# Patient Record
Sex: Female | Born: 1985 | Hispanic: Yes | Marital: Single | Smoking: Former smoker
Health system: Southern US, Community
[De-identification: ages and names within clinical notes are randomized; demographics above are authoritative.]

## PROBLEM LIST (undated history)

## (undated) DIAGNOSIS — O24419 Gestational diabetes mellitus in pregnancy, unspecified control: Secondary | ICD-10-CM

## (undated) DIAGNOSIS — F329 Major depressive disorder, single episode, unspecified: Secondary | ICD-10-CM

## (undated) DIAGNOSIS — F32A Depression, unspecified: Secondary | ICD-10-CM

## (undated) DIAGNOSIS — R51 Headache: Secondary | ICD-10-CM

## (undated) DIAGNOSIS — K219 Gastro-esophageal reflux disease without esophagitis: Secondary | ICD-10-CM

## (undated) DIAGNOSIS — E282 Polycystic ovarian syndrome: Secondary | ICD-10-CM

## (undated) DIAGNOSIS — D649 Anemia, unspecified: Secondary | ICD-10-CM

## (undated) HISTORY — PX: NO PAST SURGERIES: SHX2092

---

## 2011-08-24 ENCOUNTER — Emergency Department (HOSPITAL_COMMUNITY)
Admission: EM | Admit: 2011-08-24 | Discharge: 2011-08-24 | Disposition: A | Discharge status: Home / Self Care | Payer: Self-pay | Attending: Emergency Medicine | Admitting: Emergency Medicine

## 2011-08-24 ENCOUNTER — Encounter: Payer: Self-pay | Admitting: Emergency Medicine

## 2011-08-24 DIAGNOSIS — H5710 Ocular pain, unspecified eye: Secondary | ICD-10-CM | POA: Insufficient documentation

## 2011-08-24 DIAGNOSIS — F17200 Nicotine dependence, unspecified, uncomplicated: Secondary | ICD-10-CM | POA: Insufficient documentation

## 2011-08-24 DIAGNOSIS — H53149 Visual discomfort, unspecified: Secondary | ICD-10-CM | POA: Insufficient documentation

## 2011-08-24 DIAGNOSIS — H539 Unspecified visual disturbance: Secondary | ICD-10-CM | POA: Insufficient documentation

## 2011-08-24 DIAGNOSIS — S0510XA Contusion of eyeball and orbital tissues, unspecified eye, initial encounter: Secondary | ICD-10-CM | POA: Insufficient documentation

## 2011-08-24 MED ORDER — ERYTHROMYCIN 5 MG/GM OP OINT
TOPICAL_OINTMENT | Freq: Once | OPHTHALMIC | Status: AC
  Administered 2011-08-24: 20:00:00 via OPHTHALMIC
  Filled 2011-08-24: qty 1

## 2011-08-24 MED ORDER — TETRACAINE HCL 0.5 % OP SOLN
1.0000 [drp] | Freq: Once | OPHTHALMIC | Status: DC
  Filled 2011-08-24: qty 2

## 2011-08-24 MED ORDER — ERYTHROMYCIN 5 MG/GM OP OINT: TOPICAL_OINTMENT | Freq: Four times a day (QID) | OPHTHALMIC | Status: AC

## 2011-08-24 MED ORDER — FLUORESCEIN SODIUM 1 MG OP STRP
ORAL_STRIP | OPHTHALMIC | Status: AC
  Filled 2011-08-24: qty 1

## 2011-08-24 NOTE — ED Notes (Signed)
Pt visual acuity:she stated that she wears corrective wear but doesn't have it with her. Uncorrected, Both: 20/20, Right: 20/25, Left 20/25 blurry.
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
## 2011-08-24 NOTE — ED Notes (Signed)
Left Eye dressing placed to left eye with sterile 2x2's, sterile eye pad, and 1 inch tape. Pt. Tolerated well.

## 2011-08-24 NOTE — ED Notes (Signed)
Ointment placed in eye and on lacs. Dressing placed. Pt tolerated well. No distress noted. Dressing dry and intact.
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
## 2011-08-25 NOTE — ED Provider Notes (Signed)
I saw and evaluated the patient, reviewed the resident's note and I agree with the findings and plan.  There is a small laceration adjacent to the medial canthus of the left eye.  When closing her eye, the lid does not completely close.  We spoke with opthalmology who recommends a dressing and follow up tomorrow.    Geoffery Lyons, MD 08/25/11 [phone]

## 2012-04-23 ENCOUNTER — Emergency Department (HOSPITAL_COMMUNITY)
Admission: EM | Admit: 2012-04-23 | Discharge: 2012-04-23 | Disposition: A | Discharge status: Home / Self Care | Payer: BC Managed Care – PPO | Attending: Emergency Medicine | Admitting: Emergency Medicine

## 2012-04-23 ENCOUNTER — Encounter (HOSPITAL_COMMUNITY): Payer: Self-pay | Admitting: Emergency Medicine

## 2012-04-23 DIAGNOSIS — Z87891 Personal history of nicotine dependence: Secondary | ICD-10-CM | POA: Insufficient documentation

## 2012-04-23 DIAGNOSIS — O200 Threatened abortion: Secondary | ICD-10-CM | POA: Insufficient documentation

## 2012-04-23 LAB — OB RESULTS CONSOLE RUBELLA ANTIBODY, IGM: Rubella: IMMUNE

## 2012-04-23 LAB — OB RESULTS CONSOLE HEPATITIS B SURFACE ANTIGEN: Hepatitis B Surface Ag: NEGATIVE

## 2012-04-23 LAB — OB RESULTS CONSOLE ABO/RH: RH Type: POSITIVE

## 2012-04-23 LAB — OB RESULTS CONSOLE ANTIBODY SCREEN: Antibody Screen: NEGATIVE

## 2012-04-23 LAB — ABO/RH

## 2012-04-23 NOTE — ED Provider Notes (Addendum)
History     CSN: [id number]  Arrival date & time 04/23/12  0305   First MD Initiated Contact with Patient 04/23/12 0324      Chief Complaint  Patient presents with  . Vaginal Bleeding    (Consider location/radiation/quality/duration/timing/severity/associated sxs/prior treatment) Patient is a 26 y.o. female presenting with vaginal bleeding. The history is provided by the patient.  Vaginal Bleeding This is a new ([redacted] weeks pregnant and had sexual intercourse tonight and started having bleeding after) problem. The current episode started 1 to 2 hours ago. The problem occurs constantly. The problem has not changed since onset.Associated symptoms include abdominal pain. Pertinent negatives include no shortness of breath. Nothing aggravates the symptoms. Nothing relieves the symptoms. She has tried nothing for the symptoms. The treatment provided no relief.    History reviewed. No pertinent past medical history.  History reviewed. No pertinent past surgical history.  History reviewed. No pertinent family history.  History  Substance Use Topics  . Smoking status: Former Smoker -- 0.5 packs/day    Types: Cigarettes  . Smokeless tobacco: Not on file  . Alcohol Use: No    OB History    Grav Para Term Preterm Abortions TAB SAB Ect Mult Living                  Review of Systems  Constitutional: Negative for fever and chills.  Respiratory: Negative for cough and shortness of breath.   Gastrointestinal: Positive for abdominal pain.  Genitourinary: Positive for vaginal bleeding. Negative for dysuria.  All other systems reviewed and are negative.    Allergies  Review of patient's allergies indicates no known allergies.  Home Medications   Current Outpatient Rx  Name Route Sig Dispense Refill  . THERA M PLUS PO TABS Oral Take 1 tablet by mouth daily.        BP 120/74  Pulse 99  Temp 98.4 F (36.9 C) (Oral)  Resp 16  SpO2 97%  LMP 02/07/2012  Physical Exam  Nursing  note and vitals reviewed. Constitutional: She is oriented to person, place, and time. She appears well-developed and well-nourished. No distress.  HENT:  Head: Normocephalic and atraumatic.  Mouth/Throat: Oropharynx is clear and moist.  Eyes: Conjunctivae and EOM are normal. Pupils are equal, round, and reactive to light.  Neck: Normal range of motion. Neck supple.  Cardiovascular: Normal rate, regular rhythm and intact distal pulses.   No murmur heard. Pulmonary/Chest: Effort normal and breath sounds normal. No respiratory distress. She has no wheezes. She has no rales.  Abdominal: Soft. She exhibits no distension. There is tenderness in the suprapubic area. There is no rebound and no guarding.  Genitourinary: Uterus normal. Cervix exhibits no motion tenderness, no discharge and no friability. Right adnexum displays no mass, no tenderness and no fullness. Left adnexum displays no mass, no tenderness and no fullness. There is bleeding around the vagina.       Cervix is closed and thick  Musculoskeletal: Normal range of motion. She exhibits no edema and no tenderness.  Neurological: She is alert and oriented to person, place, and time.  Skin: Skin is warm and dry. No rash noted. No erythema.  Psychiatric: She has a normal mood and affect. Her behavior is normal.    ED Course  Procedures (including critical care time)   Labs Reviewed  ABO/RH   No results found.  EMERGENCY DEPARTMENT [place] PREGNANCY "Study: Limited Ultrasound of the Pelvis"  INDICATIONS:Vaginal bleeding Multiple views of the uterus and  pelvic cavity are obtained with a multi-frequency probe.  APPROACH:Transabdominal   PERFORMED BY: Myself  IMAGES ARCHIVED?: No  LIMITATIONS: none  PREGNANCY FREE FLUID: None  PREGNANCY UTERUS FINDINGS:Gestational sac noted ADNEXAL FINDINGS:Right ovary not seen  PREGNANCY FINDINGS: Intrauterine gestational sac noted and Fetal heart activity seen  INTERPRETATION: Viable  intrauterine pregnancy and Pelvic free fluid absent  GESTATIONAL AGE, ESTIMATE: 9 weeks  FETAL HEART RATE: 154  COMMENT(Estimate of Gestational Age):      1. Threatened abortion       MDM   Patient is [redacted] weeks pregnant with a ultrasound confirmed IUP by an OB/GYN. She presented today due to vaginal bleeding after sexual intercourse. She's also having mild lower abdominal cramping. On exam patient has mild suprapubic pain and pelvic shows blood clots but unable to express any blood from the cervix and the cervix is closed and thick. Bedside ultrasound shows a fetus measuring in 9 weeks with a heart rate of 154 without any signs of intraperitoneal fluid  Pt is B+ and d/ced home to f/u with OB.      Gwyneth Sprout, MD 04/23/12 1610  Gwyneth Sprout, MD 04/23/12 [phone]

## 2012-04-23 NOTE — ED Notes (Signed)
Patient presents stating approximately 1 hour ago she noticed bright red blood in the toilet.  (about 3 clots)  States she did not have any pain but just noticed the blood.  Stated this happened before during one of her pregnancys and she had an ectopic pregnancy.

## 2012-04-23 NOTE — ED Notes (Signed)
Patient with vaginal bleeding.  Patient is [redacted] weeks pregnant, patient states that it started when she went to the bathroom.

## 2012-05-12 LAB — OB RESULTS CONSOLE GC/CHLAMYDIA: Chlamydia: NEGATIVE

## 2012-09-29 ENCOUNTER — Encounter: Payer: BC Managed Care – PPO | Attending: Obstetrics | Admitting: Dietician

## 2012-09-29 VITALS — Ht 62.0 in | Wt 205.1 lb

## 2012-09-29 DIAGNOSIS — Z713 Dietary counseling and surveillance: Secondary | ICD-10-CM | POA: Insufficient documentation

## 2012-09-29 DIAGNOSIS — O99810 Abnormal glucose complicating pregnancy: Secondary | ICD-10-CM | POA: Insufficient documentation

## 2012-09-29 DIAGNOSIS — O24419 Gestational diabetes mellitus in pregnancy, unspecified control: Secondary | ICD-10-CM

## 2012-09-30 ENCOUNTER — Encounter: Payer: Self-pay | Admitting: Dietician

## 2012-09-30 NOTE — Progress Notes (Signed)
  Patient was seen on 09/28/2012 for Gestational Diabetes self-management class at the [facility]. The following learning objectives were met by the patient during this course:   States the definition of Gestational Diabetes  States why dietary management is important in controlling blood glucose  Describes the effects each nutrient has on blood glucose levels  Demonstrates ability to create a balanced meal plan  Demonstrates carbohydrate counting   States when to check blood glucose levels  Demonstrates proper blood glucose monitoring techniques  States the effect of stress and exercise on blood glucose levels  States the importance of limiting caffeine and abstaining from alcohol and smoking  Blood glucose monitor given: One Touch Ultra Mini Lot # ![id number] X Exp: 01/2013 Blood glucose reading: 137 at 6:30 PM  Patient instructed to monitor glucose levels: FBS: 60 - <90 2 hour: <120  Patient received handouts:  Nutrition Diabetes and Pregnancy  Carbohydrate Counting List  Patient will be seen for follow-up as needed.

## 2012-10-13 NOTE — L&D Delivery Note (Signed)
Delivery Note  First Stage: Labor onset: mild ctx since 0700 this am - no cervical change  / stronger ctx around 1500 and intense at 1800 with bloody show Augmentation : none Analgesia /Anesthesia intrapartum: none ROM artificial at 2037 - thick particulate meconium - NICU called for delivery  Second Stage: Complete dilation at 2037 Onset of pushing at 2037 FHR second stage 130 - repetitive variable decels to 90 nadir with ctx  Delivery of a viable female at 2042 by CNM in ROA position.  No nuchal cord. Cord double clamped - immediately cut by provider - not stimulated /newborn to warmer for suctioning  -newborn to nursing staff pending NICU arrival Cord blood sample collected.  Arterial cord blood sample collected - handed off to nurse  Third Stage: Placenta delivered shultz intact with 3 VC @ 2047 Placenta - for disposal . Uterine tone firm / bleeding small  IV infiltrated with pushing - IV d/c prior to pitocin Cytotec PR for bleeding prophylaxis ( high PPH risk per protocol)  NO laceration identified.  Anesthesia for repair: none  Est. Blood Loss (mL): 300  Complications: none  Mom to postpartum.  Baby to nursery-stable.  Newborn: Birth Weight: 5lb and  14oz Apgar Scores: 6-7 / arterial gas pending / newborn remains in room with Mom Feeding planned: breast  Marlinda Mike CNM, MSN 11/13/2012, 9:00 PM

## 2012-11-08 ENCOUNTER — Inpatient Hospital Stay (HOSPITAL_COMMUNITY)
Admission: AD | Admit: 2012-11-08 | Discharge: 2012-11-08 | Disposition: A | Discharge status: Home / Self Care | Payer: BC Managed Care – PPO | Source: Ambulatory Visit | Attending: Obstetrics and Gynecology | Admitting: Obstetrics and Gynecology

## 2012-11-08 ENCOUNTER — Encounter (HOSPITAL_COMMUNITY): Payer: Self-pay

## 2012-11-08 DIAGNOSIS — O479 False labor, unspecified: Secondary | ICD-10-CM | POA: Insufficient documentation

## 2012-11-08 HISTORY — DX: Major depressive disorder, single episode, unspecified: F32.9

## 2012-11-08 HISTORY — DX: Headache: R51

## 2012-11-08 HISTORY — DX: Depression, unspecified: F32.A

## 2012-11-08 HISTORY — DX: Polycystic ovarian syndrome: E28.2

## 2012-11-08 HISTORY — DX: Gestational diabetes mellitus in pregnancy, unspecified control: O24.419

## 2012-11-08 LAB — OB RESULTS CONSOLE GBS: GBS: NEGATIVE

## 2012-11-08 NOTE — MAU Provider Note (Signed)
  History   Presents with complaint of contractions  CSN: [id number]  Arrival date and time: 11/08/12 0148   None     Chief Complaint  Patient presents with  . Labor Eval   HPI  OB History    Grav Para Term Preterm Abortions TAB SAB Ect Mult Living   3 1 1  1   1  1       Past Medical History  Diagnosis Date  . Gestational diabetes   . Depression   . PCOS (polycystic ovarian syndrome)   . Headache     Past Surgical History  Procedure Date  . No past surgeries     Family History  Problem Relation Age of Onset  . Cancer Other   . Hypertension Other   . Stroke Other   . Heart attack Other     History  Substance Use Topics  . Smoking status: Former Smoker -- 0.5 packs/day    Types: Cigarettes  . Smokeless tobacco: Not on file  . Alcohol Use: No    Allergies: No Known Allergies  No prescriptions prior to admission    ROS Physical Exam: VE per RN   Blood pressure 127/81, pulse 93, temperature 97.7 F (36.5 C), temperature source Oral, resp. rate 20, height 5\' 2"  (1.575 m), weight 98.431 kg (217 lb), last menstrual period 02/07/2012, SpO2 100.00%.  Physical Exam  MAU Course  Procedures  MDM na  Assessment and Plan  Prodromal labor Reactive NST No cervical change DC home with labor warnings  Zakyla Tonche J 11/08/2012, 7:41 AM

## 2012-11-08 NOTE — MAU Note (Signed)
Presents with complaint of contractions. Denies bleeding or ROM

## 2012-11-11 ENCOUNTER — Other Ambulatory Visit: Payer: Self-pay | Admitting: Obstetrics

## 2012-11-12 ENCOUNTER — Telehealth (HOSPITAL_COMMUNITY): Payer: Self-pay | Admitting: *Deleted

## 2012-11-12 ENCOUNTER — Other Ambulatory Visit: Payer: Self-pay | Admitting: Obstetrics and Gynecology

## 2012-11-12 ENCOUNTER — Encounter (HOSPITAL_COMMUNITY): Payer: Self-pay | Admitting: *Deleted

## 2012-11-12 ENCOUNTER — Other Ambulatory Visit: Payer: Self-pay | Admitting: Obstetrics

## 2012-11-12 NOTE — Telephone Encounter (Signed)
Preadmission screen  

## 2012-11-13 ENCOUNTER — Inpatient Hospital Stay (HOSPITAL_COMMUNITY)
Admission: AD | Admit: 2012-11-13 | Discharge: 2012-11-15 | DRG: 372 | Disposition: A | Discharge status: Home / Self Care | Payer: BC Managed Care – PPO | Source: Ambulatory Visit | Attending: Obstetrics and Gynecology | Admitting: Obstetrics and Gynecology

## 2012-11-13 ENCOUNTER — Encounter (HOSPITAL_COMMUNITY): Payer: Self-pay | Admitting: *Deleted

## 2012-11-13 ENCOUNTER — Encounter (HOSPITAL_COMMUNITY): Payer: Self-pay | Admitting: Obstetrics and Gynecology

## 2012-11-13 ENCOUNTER — Inpatient Hospital Stay (HOSPITAL_COMMUNITY): Payer: BC Managed Care – PPO

## 2012-11-13 ENCOUNTER — Inpatient Hospital Stay (HOSPITAL_COMMUNITY)
Admission: AD | Admit: 2012-11-13 | Discharge: 2012-11-13 | Disposition: A | Discharge status: Home / Self Care | Payer: BC Managed Care – PPO | Source: Ambulatory Visit | Attending: Obstetrics and Gynecology | Admitting: Obstetrics and Gynecology

## 2012-11-13 DIAGNOSIS — O99814 Abnormal glucose complicating childbirth: Principal | ICD-10-CM | POA: Diagnosis present

## 2012-11-13 DIAGNOSIS — E282 Polycystic ovarian syndrome: Secondary | ICD-10-CM

## 2012-11-13 DIAGNOSIS — O479 False labor, unspecified: Secondary | ICD-10-CM | POA: Insufficient documentation

## 2012-11-13 DIAGNOSIS — O24410 Gestational diabetes mellitus in pregnancy, diet controlled: Secondary | ICD-10-CM

## 2012-11-13 LAB — CBC
HCT: 36 % (ref 36.0–46.0)
WBC: 12.3 10*3/uL — ABNORMAL HIGH (ref 4.0–10.5)

## 2012-11-13 MED ORDER — LACTATED RINGERS IV SOLN
INTRAVENOUS | Status: DC
  Administered 2012-11-13: 20:00:00 via INTRAVENOUS

## 2012-11-13 MED ORDER — LACTATED RINGERS IV SOLN: 500.0000 mL | Freq: Once | INTRAVENOUS | Status: DC

## 2012-11-13 MED ORDER — PHENYLEPHRINE 40 MCG/ML (10ML) SYRINGE FOR IV PUSH (FOR BLOOD PRESSURE SUPPORT): 80.0000 ug | PREFILLED_SYRINGE | INTRAVENOUS | Status: DC | PRN

## 2012-11-13 MED ORDER — LACTATED RINGERS IV SOLN: 500.0000 mL | INTRAVENOUS | Status: DC | PRN

## 2012-11-13 MED ORDER — PHENYLEPHRINE 40 MCG/ML (10ML) SYRINGE FOR IV PUSH (FOR BLOOD PRESSURE SUPPORT)
80.0000 ug | PREFILLED_SYRINGE | INTRAVENOUS | Status: DC | PRN
  Filled 2012-11-13: qty 5

## 2012-11-13 MED ORDER — EPHEDRINE 5 MG/ML INJ
10.0000 mg | INTRAVENOUS | Status: DC | PRN
  Filled 2012-11-13: qty 4

## 2012-11-13 MED ORDER — OXYTOCIN 10 UNIT/ML IJ SOLN
INTRAMUSCULAR | Status: AC
  Filled 2012-11-13: qty 1

## 2012-11-13 MED ORDER — OXYTOCIN BOLUS FROM INFUSION: 500.0000 mL | INTRAVENOUS | Status: DC

## 2012-11-13 MED ORDER — ONDANSETRON HCL 4 MG/2ML IJ SOLN: 4.0000 mg | Freq: Four times a day (QID) | INTRAMUSCULAR | Status: DC | PRN

## 2012-11-13 MED ORDER — CITRIC ACID-SODIUM CITRATE 334-500 MG/5ML PO SOLN: 30.0000 mL | ORAL | Status: DC | PRN

## 2012-11-13 MED ORDER — OXYCODONE-ACETAMINOPHEN 5-325 MG PO TABS
1.0000 | ORAL_TABLET | ORAL | Status: DC | PRN
  Administered 2012-11-13 (×2): 1 via ORAL
  Filled 2012-11-13 (×2): qty 1

## 2012-11-13 MED ORDER — OXYTOCIN 40 UNITS IN LACTATED RINGERS INFUSION - SIMPLE MED: 62.5000 mL/h | INTRAVENOUS | Status: DC

## 2012-11-13 MED ORDER — DIPHENHYDRAMINE HCL 50 MG/ML IJ SOLN: 12.5000 mg | INTRAMUSCULAR | Status: DC | PRN

## 2012-11-13 MED ORDER — MISOPROSTOL 200 MCG PO TABS
800.0000 ug | ORAL_TABLET | Freq: Once | ORAL | Status: AC
  Administered 2012-11-13: 800 ug via RECTAL
  Filled 2012-11-13: qty 4

## 2012-11-13 MED ORDER — ACETAMINOPHEN 325 MG PO TABS: 650.0000 mg | ORAL_TABLET | ORAL | Status: DC | PRN

## 2012-11-13 MED ORDER — IBUPROFEN 600 MG PO TABS
600.0000 mg | ORAL_TABLET | Freq: Four times a day (QID) | ORAL | Status: DC | PRN
  Administered 2012-11-13: 600 mg via ORAL
  Filled 2012-11-13: qty 1

## 2012-11-13 MED ORDER — FENTANYL 2.5 MCG/ML BUPIVACAINE 1/10 % EPIDURAL INFUSION (WH - ANES)
14.0000 mL/h | INTRAMUSCULAR | Status: DC
  Filled 2012-11-13: qty 125

## 2012-11-13 MED ORDER — EPHEDRINE 5 MG/ML INJ: 10.0000 mg | INTRAVENOUS | Status: DC | PRN

## 2012-11-13 MED ORDER — LIDOCAINE HCL (PF) 1 % IJ SOLN
30.0000 mL | INTRAMUSCULAR | Status: DC | PRN
  Filled 2012-11-13: qty 30

## 2012-11-13 NOTE — MAU Note (Signed)
Pt came in very uncomfortable via w/c. States was here earlier and was 3cm and has contracted all day. Feeling some rectal pressure. Dana RN in [facility] called and pt to 162 via w/c

## 2012-11-13 NOTE — H&P (Signed)
  OB ADMISSION/ HISTORY & PHYSICAL:  Admission Date: 11/13/2012  7:51 PM  Admit Diagnosis: active labor  Sheila Willis is a 27 y.o. female presenting for active labor - strong ctx for past 2 hours.  Prenatal History: G3P1011   EDC : 11/20/2012, by Other Basis  Prenatal care at [facility]  Primary Ob Provider: Ernestina Penna Prenatal course complicated by GDM  Prenatal Labs: ABO, Rh: B (07/12 0000) positive Antibody: Negative (07/12 0000) Rubella: Immune (07/12 0000)  RPR: Nonreactive (07/12 0000)  HBsAg: Negative (07/12 0000)  HIV: Non-reactive (07/12 0000)  GBS: Negative (01/27 0000)  1 hr Glucola : ABNORMAL - GDM-A1  Medical / Surgical History :  Past medical history:  Past Medical History  Diagnosis Date  . Gestational diabetes   . Depression   . PCOS (polycystic ovarian syndrome)   . Headache     Past surgical history:  Past Surgical History  Procedure Date  . No past surgeries    Family History:  Family History  Problem Relation Age of Onset  . Cancer Other   . Hypertension Other   . Stroke Other   . Heart attack Other   . Heart disease Mother   . Hypertension Mother   . Cancer Mother     ovarian    Social History:  reports that she has quit smoking. Her smoking use included Cigarettes. She smoked .5 packs per day. She does not have any smokeless tobacco history on file. She reports that she does not drink alcohol or use illicit drugs.  Allergies: Review of patient's allergies indicates no known allergies.   Current Medications at time of admission:  Prenatal zoloft  Review of Systems: Painful ctx - strong ctx with urge to push Water not broke yet + bloody show  Physical Exam:  VS: Temperature 98 F (36.7 C), temperature source Oral, resp. rate 20, height 5\' 2"  (1.575 m), weight 95.255 kg (210 lb), last menstrual period 02/07/2012.  General: alert and oriented, appears very uncomfortable with ctx Heart: RRR Lungs: Clear lung  fields Abdomen: Gravid uterus non-tender / firm ctx Extremities: no edema  Genitalia / VE: Dilation: 7 Effacement (%): 90 Station: -1 Exam by:: N Psychologist, counselling  Exam at time of provider arrival 10 / 100% / vtx / +3 BBOW  FHR: baseline rate 130/ variability moderate / accels none / decels variable down to nadir 90 w ctx TOCO: ctx every 2 minutes strong  Assessment: 39 weeks - active labor Imminent birth  Plan:  Admit - prepare for imminent birth Dr Billy Coast notified of admission / plan of care   Marlinda Mike CNM, MSN 11/13/2012, 8:36 PM

## 2012-11-13 NOTE — MAU Note (Signed)
"  I started having UC's this morning at 0700 about every 5 mins.  They are closer together now.  No VB or LOF.  (+) FM."

## 2012-11-13 NOTE — Progress Notes (Signed)
Wiliam Ke CNM notified of IV infiltration and discontinued IV with high PPH risk. Provider ok with no IV access.

## 2012-11-13 NOTE — MAU Provider Note (Signed)
  History     CSN: [id number]  Arrival date and time: 11/13/12 0909 Provider here to see patient - orders given / EFM reviewed @ 1000     Chief Complaint  Patient presents with  . Contractions   HPI  Ctx intermittently for past week - increased this am  No bleeding Ctx not painful just more regular No LOF Active FM  Past Medical History  Diagnosis Date  . Gestational diabetes   . Depression   . PCOS (polycystic ovarian syndrome)   . Headache     Past Surgical History  Procedure Date  . No past surgeries     Family History  Problem Relation Age of Onset  . Cancer Other   . Hypertension Other   . Stroke Other   . Heart attack Other   . Heart disease Mother   . Hypertension Mother   . Cancer Mother     ovarian    History  Substance Use Topics  . Smoking status: Former Smoker -- 0.5 packs/day    Types: Cigarettes  . Smokeless tobacco: Not on file  . Alcohol Use: No    Allergies: No Known Allergies  Prescriptions prior to admission  Medication Sig Dispense Refill  . Prenatal Vit-Fe Fumarate-FA (PRENATAL MULTIVITAMIN) TABS Take 1 tablet by mouth daily.      . sertraline (ZOLOFT) 25 MG tablet Take 25 mg by mouth daily.        ROS Physical Exam   Last menstrual period 02/07/2012.  Physical Exam Calm - sitting in bed talking with family Heart -RRR Lungs-clear Abdomen - soft and non-tender  VE : 3cm on recheck / no cervical change / no bloody show  EFM: category 1 - occasional variable decel           Baseline 130 / moderate variability / + accels to 150  BPP  6-8 (1 breathing) and normal AFI at 13cm  MAU Course  Procedures  Ultrasound Labor check   Assessment and Plan  39 weeks contractions No evidence of labor at this time - no cervical change from exam last week in office  Labor precautions - return with increased labor symptoms / bleeding / ROM FKC - call if any decrease in fetal movement activity - apt in office on Tuesday  Marlinda Mike 11/13/2012 1015am

## 2012-11-14 DIAGNOSIS — O24410 Gestational diabetes mellitus in pregnancy, diet controlled: Secondary | ICD-10-CM | POA: Diagnosis present

## 2012-11-14 DIAGNOSIS — E282 Polycystic ovarian syndrome: Secondary | ICD-10-CM | POA: Diagnosis present

## 2012-11-14 LAB — CBC
HCT: 30.4 % — ABNORMAL LOW (ref 36.0–46.0)
Hemoglobin: 10.1 g/dL — ABNORMAL LOW (ref 12.0–15.0)
MCH: 28.2 pg (ref 26.0–34.0)
MCHC: 33.2 g/dL (ref 30.0–36.0)
MCV: 84.9 fL (ref 78.0–100.0)
Platelets: 235 10*3/uL (ref 150–400)
RBC: 3.58 MIL/uL — ABNORMAL LOW (ref 3.87–5.11)
RDW: 14.3 % (ref 11.5–15.5)
WBC: 13.1 10*3/uL — ABNORMAL HIGH (ref 4.0–10.5)

## 2012-11-14 LAB — HEMOGLOBIN A1C
Hgb A1c MFr Bld: 6.3 % — ABNORMAL HIGH (ref <5.7)
Mean Plasma Glucose: 134 mg/dL — ABNORMAL HIGH (ref <117)

## 2012-11-14 LAB — RPR: RPR Ser Ql: NONREACTIVE

## 2012-11-14 LAB — TYPE AND SCREEN

## 2012-11-14 MED ORDER — WITCH HAZEL-GLYCERIN EX PADS: 1.0000 "application " | MEDICATED_PAD | CUTANEOUS | Status: DC | PRN

## 2012-11-14 MED ORDER — OXYCODONE-ACETAMINOPHEN 5-325 MG PO TABS
1.0000 | ORAL_TABLET | ORAL | Status: DC | PRN
  Administered 2012-11-14 – 2012-11-15 (×6): 2 via ORAL
  Administered 2012-11-15: 1 via ORAL
  Filled 2012-11-14: qty 1
  Filled 2012-11-14 (×7): qty 2

## 2012-11-14 MED ORDER — SERTRALINE HCL 25 MG PO TABS
25.0000 mg | ORAL_TABLET | Freq: Every day | ORAL | Status: DC
  Administered 2012-11-14 – 2012-11-15 (×2): 25 mg via ORAL
  Filled 2012-11-14 (×3): qty 1

## 2012-11-14 MED ORDER — DIPHENHYDRAMINE HCL 25 MG PO CAPS: 25.0000 mg | ORAL_CAPSULE | Freq: Four times a day (QID) | ORAL | Status: DC | PRN

## 2012-11-14 MED ORDER — LANOLIN HYDROUS EX OINT: TOPICAL_OINTMENT | CUTANEOUS | Status: DC | PRN

## 2012-11-14 MED ORDER — SENNOSIDES-DOCUSATE SODIUM 8.6-50 MG PO TABS
2.0000 | ORAL_TABLET | Freq: Every day | ORAL | Status: DC
  Administered 2012-11-14 (×2): 2 via ORAL

## 2012-11-14 MED ORDER — PRENATAL MULTIVITAMIN CH
1.0000 | ORAL_TABLET | Freq: Every day | ORAL | Status: DC
  Administered 2012-11-14 – 2012-11-15 (×2): 1 via ORAL
  Filled 2012-11-14 (×2): qty 1

## 2012-11-14 MED ORDER — IBUPROFEN 600 MG PO TABS
600.0000 mg | ORAL_TABLET | Freq: Four times a day (QID) | ORAL | Status: DC
  Administered 2012-11-14 – 2012-11-15 (×6): 600 mg via ORAL
  Filled 2012-11-14 (×6): qty 1

## 2012-11-14 MED ORDER — DIBUCAINE 1 % RE OINT: 1.0000 "application " | TOPICAL_OINTMENT | RECTAL | Status: DC | PRN

## 2012-11-14 MED ORDER — BENZOCAINE-MENTHOL 20-0.5 % EX AERO
1.0000 "application " | INHALATION_SPRAY | CUTANEOUS | Status: DC | PRN
  Administered 2012-11-14: 1 via TOPICAL
  Filled 2012-11-14: qty 56

## 2012-11-14 NOTE — Progress Notes (Signed)
CSW attempted to met with MOB for consult due to infant being admitted to NICU.  MOB asked if CSW could return at a later time.  CSW will attempt again to meet with MOB to discuss SW support in NICU.    [phone] 

## 2012-11-14 NOTE — Progress Notes (Signed)
PPD 1 SVD  S:  Reports feeling well - states baby went to NICU late last night with low oxygen level             Tolerating po/ No nausea or vomiting             Bleeding is light             Pain controlled with motrin             Up ad lib / ambulatory  Newborn breast feeding - pumping started this am             Newborn in NICU - likely MEC aspiration syndrome / SGA  O:               VS: BP 121/78  Pulse 80  Temp 98.5 F (36.9 C) (Oral)  Resp 20  Ht 5\' 2"  (1.575 m)  Wt 95.255 kg (210 lb)  BMI 38.41 kg/m2  LMP 02/07/2012  Breastfeeding? Unknown   LABS:  Basename 11/14/12 0540 11/13/12 2007  WBC 13.1* 12.3*  HGB 10.1* 11.9*  PLT 235 258                    Physical Exam:             Alert and oriented X3  Lungs: Clear and unlabored  Heart: regular rate and rhythm / no mumurs  Abdomen: soft, non-tender, non-distended              Fundus: firm, non-tender, Ueven  Perineum: intact with mild edema  Lochia: light  Extremities: trace edema, no calf pain or tenderness    A: PPD # 1              GDM-A1 / PCOS - delivered  doing well - stable status  P:  routine post partum orders  check FBS in am             lactation assist for pumping   Marlinda Mike CNM, MSN 11/14/2012, 9:58 AM

## 2012-11-15 MED ORDER — DOCUSATE SODIUM 100 MG PO CAPS: 100.0000 mg | ORAL_CAPSULE | Freq: Two times a day (BID) | ORAL | Status: AC

## 2012-11-15 MED ORDER — IBUPROFEN 600 MG PO TABS: 600.0000 mg | ORAL_TABLET | Freq: Four times a day (QID) | ORAL | Status: DC

## 2012-11-15 MED ORDER — OXYCODONE-ACETAMINOPHEN 5-325 MG PO TABS: 1.0000 | ORAL_TABLET | Freq: Four times a day (QID) | ORAL | Status: DC | PRN

## 2012-11-15 NOTE — Discharge Summary (Signed)
Obstetric Discharge Summary Reason for Admission: onset of labor and [redacted] wks gestation Prenatal Procedures: NST and ultrasound Intrapartum Procedures: spontaneous vaginal delivery Postpartum Procedures: none Complications-Operative and Postpartum: none Hemoglobin  Date Value Range Status  11/14/2012 10.1* 12.0 - 15.0 g/dL Final     HCT  Date Value Range Status  11/14/2012 30.4* 36.0 - 46.0 % Final    Physical Exam:  General: alert, cooperative and no distress Lochia: appropriate Uterine Fundus: firm Incision: n/a DVT Evaluation: No evidence of DVT seen on physical exam.  Discharge Diagnoses: Term Pregnancy-delivered, Hx Depression and PCOS  Discharge Information: Date: 11/15/2012 Activity: pelvic rest Diet: routine Medications: PNV, Ibuprofen, Colace and Percocet Condition: stable Instructions: refer to practice specific booklet Discharge to: home Follow-up Information    Follow up with [facility] A., MD. In 6 weeks.   Contact information:   [address] [place] [address] [phone]          Newborn Data: Live born female on 11/13/12 (Autumn) Birth Weight: 5 lb 14.5 oz (2680 g) APGAR: 6, 7  Home with mother.  Jusitn Salsgiver K 11/15/2012, 10:07 AM

## 2012-11-15 NOTE — Progress Notes (Signed)
Patient ID: Sheila Willis, female   DOB: [birth year], 27 y.o.   MRN: [id number] PPD # 2  Subjective: Pt reports feeling well.  OK with d/c to home, however, hopes to room in as infant remains in NICU d/t meconium aspiration.  Infant stable and most likely to be discharged tomorrow. Pain controlled with ibuprofen and percocet Tolerating po/ Voiding without problems/ No n/v Bleeding is light/ Newborn info:  Information for the patient's newborn:  Altemose, Girl Cyrilla [[id number]]  female  Feeding: breast    Objective:  VS: Blood pressure 118/80, pulse 70, temperature 98 F (36.7 C), temperature source Oral, resp. rate 20.  FBS 79   Basename 11/14/12 0540 11/13/12 2007  WBC 13.1* 12.3*  HGB 10.1* 11.9*  HCT 30.4* 36.0  PLT 235 258    Blood type: --/--/B POS (02/01 2300) Rubella: Immune (07/12 0000)    Physical Exam:  General: A & O x 3  alert, cooperative and no distress CV: Regular rate and rhythm Resp: clear Abdomen: soft, nontender, normal bowel sounds Uterine Fundus: firm, below umbilicus, nontender Perineum: intact; no edema Lochia: minimal Ext: edema trace to +1 and Homans sign is negative, no sign of DVT    A/P: PPD # 2/ G3P2012/ S/P:spontaneous vaginal delivery Hx PCOS Doing well and stable for discharge home RX: Ibuprofen 600mg  po Q 6 hrs prn pain #30 Refill x 1 Percocet 5/325 1 to 2 po Q 4 hrs prn pain #12 No refill Colace 100mg  po BID prn #60 WOB/GYN booklet given Routine pp visit in 6wks   Demetrius Revel, MSN, [facility] 11/15/2012, 9:40 AM

## 2012-11-18 ENCOUNTER — Inpatient Hospital Stay (HOSPITAL_COMMUNITY): Admission: RE | Admit: 2012-11-18 | Payer: BC Managed Care – PPO | Source: Ambulatory Visit

## 2013-04-20 ENCOUNTER — Emergency Department (HOSPITAL_COMMUNITY)
Admission: EM | Admit: 2013-04-20 | Discharge: 2013-04-20 | Disposition: A | Discharge status: Home / Self Care | Payer: BC Managed Care – PPO | Source: Home / Self Care | Attending: Family Medicine | Admitting: Family Medicine

## 2013-04-20 ENCOUNTER — Encounter (HOSPITAL_COMMUNITY): Payer: Self-pay | Admitting: Emergency Medicine

## 2013-04-20 DIAGNOSIS — J039 Acute tonsillitis, unspecified: Secondary | ICD-10-CM

## 2013-04-20 LAB — POCT INFECTIOUS MONO SCREEN: Mono Screen: NEGATIVE

## 2013-04-20 LAB — POCT RAPID STREP A: Streptococcus, Group A Screen (Direct): NEGATIVE

## 2013-04-20 MED ORDER — GUAIFENESIN-CODEINE 100-10 MG/5ML PO SYRP: 5.0000 mL | ORAL_SOLUTION | Freq: Three times a day (TID) | ORAL | Status: DC | PRN

## 2013-04-20 MED ORDER — IBUPROFEN 600 MG PO TABS: 600.0000 mg | ORAL_TABLET | Freq: Three times a day (TID) | ORAL | Status: DC

## 2013-04-20 MED ORDER — AMOXICILLIN 500 MG PO CAPS: 500.0000 mg | ORAL_CAPSULE | Freq: Three times a day (TID) | ORAL | Status: DC

## 2013-04-20 MED ORDER — CETIRIZINE-PSEUDOEPHEDRINE ER 5-120 MG PO TB12: 1.0000 | ORAL_TABLET | Freq: Two times a day (BID) | ORAL | Status: DC | PRN

## 2013-04-20 NOTE — ED Provider Notes (Signed)
History    CSN: [id number] Arrival date & time 04/20/13  1220  First MD Initiated Contact with Patient 04/20/13 1254     Chief Complaint  Patient presents with  . Dysphagia   (Consider location/radiation/quality/duration/timing/severity/associated sxs/prior Treatment) HPI Comments: 27 year old female with history of being over 5 month postpartum. Here complaining of nasal congestion and sore throat intermittently for about one week. Has been taking over-the-counter medications with no improvement. Reports discomfort when swallowing worse in the last 3 days. states her symptoms are getting worse. Denies fever or chills. No fatigue or diaphoresis. No sweating. No chest pain or shortness of breath. No abdominal pain nausea vomiting or diarrhea. No headache.  Past Medical History  Diagnosis Date  . Gestational diabetes   . Depression   . PCOS (polycystic ovarian syndrome)   . NWGNFAOZ(308.6)    Past Surgical History  Procedure Laterality Date  . No past surgeries     Family History  Problem Relation Age of Onset  . Cancer Other   . Hypertension Other   . Stroke Other   . Heart attack Other   . Heart disease Mother   . Hypertension Mother   . Cancer Mother     ovarian   History  Substance Use Topics  . Smoking status: Former Smoker -- 0.50 packs/day    Types: Cigarettes  . Smokeless tobacco: Not on file  . Alcohol Use: No   OB History   Grav Para Term Preterm Abortions TAB SAB Ect Mult Living   3 2 2  1   1  2      Review of Systems  Constitutional: Positive for appetite change. Negative for fever, chills, diaphoresis and fatigue.  HENT: Positive for congestion, sore throat and trouble swallowing. Negative for ear pain and neck stiffness.   Eyes: Negative for discharge.  Respiratory: Negative for cough, shortness of breath and wheezing.   Cardiovascular: Negative for chest pain and leg swelling.  Gastrointestinal: Negative for nausea, vomiting and abdominal pain.   Endocrine: Negative for cold intolerance and heat intolerance.  Musculoskeletal: Negative for myalgias and arthralgias.  Skin: Negative for rash.  Neurological: Negative for dizziness and headaches.    Allergies  Review of patient's allergies indicates no known allergies.  Home Medications   Current Outpatient Rx  Name  Route  Sig  Dispense  Refill  . amoxicillin (AMOXIL) 500 MG capsule   Oral   Take 1 capsule (500 mg total) by mouth 3 (three) times daily.   21 capsule   0   . cetirizine-pseudoephedrine (ZYRTEC-D) 5-120 MG per tablet   Oral   Take 1 tablet by mouth 2 (two) times daily as needed for allergies.   30 tablet   0   . docusate sodium (COLACE) 100 MG capsule   Oral   Take 1 capsule (100 mg total) by mouth 2 (two) times daily.   60 capsule   2   . guaiFENesin-codeine (ROBITUSSIN AC) 100-10 MG/5ML syrup   Oral   Take 5 mLs by mouth 3 (three) times daily as needed for cough.   120 mL   0   . ibuprofen (ADVIL,MOTRIN) 600 MG tablet   Oral   Take 1 tablet (600 mg total) by mouth 3 (three) times daily.   30 tablet   0   . Prenatal Vit-Fe Fumarate-FA (PRENATAL MULTIVITAMIN) TABS   Oral   Take 1 tablet by mouth daily.         . sertraline (ZOLOFT) 25  MG tablet   Oral   Take 25 mg by mouth daily.          BP 113/81  Pulse 91  Temp(Src) 97.9 F (36.6 C) (Oral)  Resp 16  SpO2 96% Physical Exam  Nursing note and vitals reviewed. Constitutional: She is oriented to person, place, and time. She appears well-developed and well-nourished. No distress.  HENT:  Head: Normocephalic and atraumatic.  Nasal Congestion with erythema and swelling of nasal turbinates, clear rhinorrhea. Significant pharyngeal and tonsillar erythema no exudates. No uvula deviation. No trismus. TM's with increased vascular markings and some dullness bilaterally no swelling or bulging  Eyes: Conjunctivae and EOM are normal. Pupils are equal, round, and reactive to light. Right eye  exhibits no discharge. Left eye exhibits no discharge. No scleral icterus.  Neck: Neck supple. No JVD present. No thyromegaly present.  Cardiovascular: Normal heart sounds.   Pulmonary/Chest: Breath sounds normal.  Abdominal: Soft. Bowel sounds are normal. She exhibits no distension and no mass. There is no tenderness. There is no rebound and no guarding.  Lymphadenopathy:    She has no cervical adenopathy.  Neurological: She is alert and oriented to person, place, and time.  Skin: No rash noted. She is not diaphoretic.    ED Course  Procedures (including critical care time) Labs Reviewed  CULTURE, GROUP A STREP  POCT RAPID STREP A ([facility] ONLY)   No results found. 1. Tonsillitis     MDM  Negative strep and mononucleosis point-of-care tests. Prescribed amoxicillin, Zyrtec D., ibuprofen, guaifenesin/codeine. Supportive care and red flags that should prompt return to medical attention discussed with patient and provided in writing.  Sharin Grave, MD 04/22/13 [phone]

## 2013-04-20 NOTE — ED Notes (Signed)
States she has trouble swallowing which started April 16, 2013.   OTC medications taken but no relief.

## 2013-04-22 LAB — CULTURE, GROUP A STREP

## 2014-08-14 ENCOUNTER — Encounter (HOSPITAL_COMMUNITY): Payer: Self-pay | Admitting: Emergency Medicine

## 2014-09-26 ENCOUNTER — Emergency Department (HOSPITAL_COMMUNITY)
Admission: EM | Admit: 2014-09-26 | Discharge: 2014-09-26 | Disposition: A | Discharge status: Home / Self Care | Payer: BC Managed Care – PPO | Source: Home / Self Care | Attending: Family Medicine | Admitting: Family Medicine

## 2014-09-26 ENCOUNTER — Encounter (HOSPITAL_COMMUNITY): Payer: Self-pay | Admitting: Emergency Medicine

## 2014-09-26 DIAGNOSIS — M6248 Contracture of muscle, other site: Secondary | ICD-10-CM

## 2014-09-26 DIAGNOSIS — M62838 Other muscle spasm: Secondary | ICD-10-CM

## 2014-09-26 MED ORDER — CYCLOBENZAPRINE HCL 5 MG PO TABS: 5.0000 mg | ORAL_TABLET | Freq: Every evening | ORAL | Status: DC | PRN

## 2014-09-26 MED ORDER — DICLOFENAC SODIUM 50 MG PO TBEC: 50.0000 mg | DELAYED_RELEASE_TABLET | Freq: Two times a day (BID) | ORAL | Status: DC | PRN
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
## 2014-09-26 NOTE — Discharge Instructions (Signed)
Thank you for coming in today. Take diclofenac twice daily for pain as needed Flexeril at bedtime for muscle spasms Attend physical therapy Follow Up with Dr. Katrinka BlazingSmith not getting better  Come back or go to the emergency room if you notice new weakness new numbness problems walking or bowel or bladder problems.  Cervical Sprain A cervical sprain is an injury in the neck in which the strong, fibrous tissues (ligaments) that connect your neck bones stretch or tear. Cervical sprains can range from mild to severe. Severe cervical sprains can cause the neck vertebrae to be unstable. This can lead to damage of the spinal cord and can result in serious nervous system problems. The amount of time it takes for a cervical sprain to get better depends on the cause and extent of the injury. Most cervical sprains heal in 1 to 3 weeks. CAUSES  Severe cervical sprains may be caused by:   Contact sport injuries (such as from football, rugby, wrestling, hockey, auto racing, gymnastics, diving, martial arts, or boxing).   Motor vehicle collisions.   Whiplash injuries. This is an injury from a sudden forward and backward whipping movement of the head and neck.  Falls.  Mild cervical sprains may be caused by:   Being in an awkward position, such as while cradling a telephone between your ear and shoulder.   Sitting in a chair that does not offer proper support.   Working at a poorly Marketing executivedesigned computer station.   Looking up or down for long periods of time.  SYMPTOMS   Pain, soreness, stiffness, or a burning sensation in the front, back, or sides of the neck. This discomfort may develop immediately after the injury or slowly, 24 hours or more after the injury.   Pain or tenderness directly in the middle of the back of the neck.   Shoulder or upper back pain.   Limited ability to move the neck.   Headache.   Dizziness.   Weakness, numbness, or tingling in the hands or arms.   Muscle  spasms.   Difficulty swallowing or chewing.   Tenderness and swelling of the neck.  DIAGNOSIS  Most of the time your health care provider can diagnose a cervical sprain by taking your history and doing a physical exam. Your health care provider will ask about previous neck injuries and any known neck problems, such as arthritis in the neck. X-rays may be taken to find out if there are any other problems, such as with the bones of the neck. Other tests, such as a CT scan or MRI, may also be needed.  TREATMENT  Treatment depends on the severity of the cervical sprain. Mild sprains can be treated with rest, keeping the neck in place (immobilization), and pain medicines. Severe cervical sprains are immediately immobilized. Further treatment is done to help with pain, muscle spasms, and other symptoms and may include:  Medicines, such as pain relievers, numbing medicines, or muscle relaxants.   Physical therapy. This may involve stretching exercises, strengthening exercises, and posture training. Exercises and improved posture can help stabilize the neck, strengthen muscles, and help stop symptoms from returning.  HOME CARE INSTRUCTIONS   Put ice on the injured area.   Put ice in a plastic bag.   Place a towel between your skin and the bag.   Leave the ice on for 15-20 minutes, 3-4 times a day.   If your injury was severe, you may have been given a cervical collar to wear. A  cervical collar is a two-piece collar designed to keep your neck from moving while it heals.  Do not remove the collar unless instructed by your health care provider.  If you have long hair, keep it outside of the collar.  Ask your health care provider before making any adjustments to your collar. Minor adjustments may be required over time to improve comfort and reduce pressure on your chin or on the back of your head.  Ifyou are allowed to remove the collar for cleaning or bathing, follow your health care  provider's instructions on how to do so safely.  Keep your collar clean by wiping it with mild soap and water and drying it completely. If the collar you have been given includes removable pads, remove them every 1-2 days and hand wash them with soap and water. Allow them to air dry. They should be completely dry before you wear them in the collar.  If you are allowed to remove the collar for cleaning and bathing, wash and dry the skin of your neck. Check your skin for irritation or sores. If you see any, tell your health care provider.  Do not drive while wearing the collar.   Only take over-the-counter or prescription medicines for pain, discomfort, or fever as directed by your health care provider.   Keep all follow-up appointments as directed by your health care provider.   Keep all physical therapy appointments as directed by your health care provider.   Make any needed adjustments to your workstation to promote good posture.   Avoid positions and activities that make your symptoms worse.   Warm up and stretch before being active to help prevent problems.  SEEK MEDICAL CARE IF:   Your pain is not controlled with medicine.   You are unable to decrease your pain medicine over time as planned.   Your activity level is not improving as expected.  SEEK IMMEDIATE MEDICAL CARE IF:   You develop any bleeding.  You develop stomach upset.  You have signs of an allergic reaction to your medicine.   Your symptoms get worse.   You develop new, unexplained symptoms.   You have numbness, tingling, weakness, or paralysis in any part of your body.  MAKE SURE YOU:   Understand these instructions.  Will watch your condition.  Will get help right away if you are not doing well or get worse. Document Released: 07/27/2007 Document Revised: 10/04/2013 Document Reviewed: 04/06/2013 [facility] Patient Information 2015 [employer]. This information is not intended to replace  advice given to you by your health care provider. Make sure you discuss any questions you have with your health care provider.
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
## 2014-09-27 ENCOUNTER — Emergency Department (HOSPITAL_COMMUNITY): Payer: BC Managed Care – PPO

## 2014-09-27 ENCOUNTER — Emergency Department (INDEPENDENT_AMBULATORY_CARE_PROVIDER_SITE_OTHER)
Admission: EM | Admit: 2014-09-27 | Discharge: 2014-09-27 | Disposition: A | Discharge status: Nursing Home (Includes ICF) | Payer: BC Managed Care – PPO | Source: Home / Self Care | Attending: Emergency Medicine | Admitting: Emergency Medicine

## 2014-09-27 ENCOUNTER — Encounter (HOSPITAL_COMMUNITY): Payer: Self-pay | Admitting: Nurse Practitioner

## 2014-09-27 ENCOUNTER — Emergency Department (HOSPITAL_COMMUNITY)
Admission: EM | Admit: 2014-09-27 | Discharge: 2014-09-27 | Disposition: A | Discharge status: Home / Self Care | Payer: BC Managed Care – PPO | Attending: Emergency Medicine | Admitting: Emergency Medicine

## 2014-09-27 ENCOUNTER — Encounter (HOSPITAL_COMMUNITY): Payer: Self-pay | Admitting: Emergency Medicine

## 2014-09-27 DIAGNOSIS — Z3202 Encounter for pregnancy test, result negative: Secondary | ICD-10-CM | POA: Insufficient documentation

## 2014-09-27 DIAGNOSIS — S0990XA Unspecified injury of head, initial encounter: Secondary | ICD-10-CM | POA: Insufficient documentation

## 2014-09-27 DIAGNOSIS — Z87891 Personal history of nicotine dependence: Secondary | ICD-10-CM | POA: Diagnosis not present

## 2014-09-27 DIAGNOSIS — F329 Major depressive disorder, single episode, unspecified: Secondary | ICD-10-CM | POA: Diagnosis not present

## 2014-09-27 DIAGNOSIS — Z79899 Other long term (current) drug therapy: Secondary | ICD-10-CM | POA: Diagnosis not present

## 2014-09-27 DIAGNOSIS — R51 Headache: Secondary | ICD-10-CM

## 2014-09-27 DIAGNOSIS — Z792 Long term (current) use of antibiotics: Secondary | ICD-10-CM | POA: Diagnosis not present

## 2014-09-27 DIAGNOSIS — Y929 Unspecified place or not applicable: Secondary | ICD-10-CM | POA: Insufficient documentation

## 2014-09-27 DIAGNOSIS — R519 Headache, unspecified: Secondary | ICD-10-CM

## 2014-09-27 DIAGNOSIS — Y9389 Activity, other specified: Secondary | ICD-10-CM | POA: Insufficient documentation

## 2014-09-27 DIAGNOSIS — Z8742 Personal history of other diseases of the female genital tract: Secondary | ICD-10-CM | POA: Insufficient documentation

## 2014-09-27 DIAGNOSIS — S0990XD Unspecified injury of head, subsequent encounter: Secondary | ICD-10-CM

## 2014-09-27 DIAGNOSIS — Y998 Other external cause status: Secondary | ICD-10-CM | POA: Diagnosis not present

## 2014-09-27 LAB — POC URINE PREG, ED: PREG TEST UR: NEGATIVE

## 2014-09-27 MED ORDER — KETOROLAC TROMETHAMINE 30 MG/ML IJ SOLN
30.0000 mg | Freq: Once | INTRAMUSCULAR | Status: DC
  Filled 2014-09-27: qty 1

## 2014-09-27 MED ORDER — SODIUM CHLORIDE 0.9 % IV BOLUS (SEPSIS)
1000.0000 mL | Freq: Once | INTRAVENOUS | Status: AC
  Administered 2014-09-27: 1000 mL via INTRAVENOUS

## 2014-09-27 MED ORDER — DIPHENHYDRAMINE HCL 50 MG/ML IJ SOLN
25.0000 mg | Freq: Once | INTRAMUSCULAR | Status: AC
  Administered 2014-09-27: 25 mg via INTRAVENOUS
  Filled 2014-09-27: qty 1

## 2014-09-27 MED ORDER — METOCLOPRAMIDE HCL 5 MG/ML IJ SOLN
10.0000 mg | Freq: Once | INTRAMUSCULAR | Status: AC
  Administered 2014-09-27: 10 mg via INTRAVENOUS
  Filled 2014-09-27: qty 2

## 2014-09-27 MED ORDER — IBUPROFEN 600 MG PO TABS: 600.0000 mg | ORAL_TABLET | Freq: Four times a day (QID) | ORAL | Status: DC | PRN

## 2014-09-27 NOTE — Discharge Instructions (Signed)
Return to the emergency room with worsening of symptoms, new symptoms or with symptoms that are concerning , especially severe worsening of headache, visual or speech changes, weakness in face, arms or legs. For difficulty breathing, shortness of breath, sensation of throat tightness. Call to make an appointment with the wellness center to establish care.
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
## 2015-05-01 ENCOUNTER — Encounter (HOSPITAL_COMMUNITY): Payer: Self-pay | Admitting: *Deleted

## 2015-05-01 ENCOUNTER — Other Ambulatory Visit (HOSPITAL_COMMUNITY)
Admission: RE | Admit: 2015-05-01 | Discharge: 2015-05-01 | Disposition: A | Discharge status: Home / Self Care | Payer: Medicaid Other | Source: Ambulatory Visit | Attending: Family Medicine | Admitting: Family Medicine

## 2015-05-01 ENCOUNTER — Emergency Department (HOSPITAL_COMMUNITY)
Admission: EM | Admit: 2015-05-01 | Discharge: 2015-05-01 | Disposition: A | Discharge status: Home / Self Care | Payer: Medicaid Other | Source: Home / Self Care

## 2015-05-01 DIAGNOSIS — R102 Pelvic and perineal pain: Secondary | ICD-10-CM | POA: Diagnosis not present

## 2015-05-01 DIAGNOSIS — N760 Acute vaginitis: Secondary | ICD-10-CM

## 2015-05-01 DIAGNOSIS — N390 Urinary tract infection, site not specified: Secondary | ICD-10-CM | POA: Diagnosis not present

## 2015-05-01 DIAGNOSIS — Z113 Encounter for screening for infections with a predominantly sexual mode of transmission: Secondary | ICD-10-CM | POA: Diagnosis not present

## 2015-05-01 LAB — POCT URINALYSIS DIP (DEVICE)
Glucose, UA: NEGATIVE mg/dL
Nitrite: POSITIVE — AB
Protein, ur: 30 mg/dL — AB
Specific Gravity, Urine: >=1.03 (ref 1.005–1.030)
Urobilinogen, UA: 0.2 mg/dL (ref 0.0–1.0)
pH: 5.5 (ref 5.0–8.0)

## 2015-05-01 LAB — POCT PREGNANCY, URINE: Preg Test, Ur: NEGATIVE

## 2015-05-01 MED ORDER — CIPROFLOXACIN HCL 500 MG PO TABS: 500.0000 mg | ORAL_TABLET | Freq: Two times a day (BID) | ORAL | Status: DC

## 2015-05-01 NOTE — Discharge Instructions (Signed)
Other tests will be back in 2 days.  At this point the vaginal irritation and UTI should respond to Cipro unless tests pending suggest otherwise.   Urinary Tract Infection Urinary tract infections (UTIs) can develop anywhere along your urinary tract. Your urinary tract is your body's drainage system for removing wastes and extra water. Your urinary tract includes two kidneys, two ureters, a bladder, and a urethra. Your kidneys are a pair of bean-shaped organs. Each kidney is about the size of your fist. They are located below your ribs, one on each side of your spine. CAUSES Infections are caused by microbes, which are microscopic organisms, including fungi, viruses, and bacteria. These organisms are so small that they can only be seen through a microscope. Bacteria are the microbes that most commonly cause UTIs. SYMPTOMS  Symptoms of UTIs may vary by age and gender of the patient and by the location of the infection. Symptoms in young women typically include a frequent and intense urge to urinate and a painful, burning feeling in the bladder or urethra during urination. Older women and men are more likely to be tired, shaky, and weak and have muscle aches and abdominal pain. A fever may mean the infection is in your kidneys. Other symptoms of a kidney infection include pain in your back or sides below the ribs, nausea, and vomiting. DIAGNOSIS To diagnose a UTI, your caregiver will ask you about your symptoms. Your caregiver also will ask to provide a urine sample. The urine sample will be tested for bacteria and white blood cells. White blood cells are made by your body to help fight infection. TREATMENT  Typically, UTIs can be treated with medication. Because most UTIs are caused by a bacterial infection, they usually can be treated with the use of antibiotics. The choice of antibiotic and length of treatment depend on your symptoms and the type of bacteria causing your infection. HOME CARE  INSTRUCTIONS  If you were prescribed antibiotics, take them exactly as your caregiver instructs you. Finish the medication even if you feel better after you have only taken some of the medication.  Drink enough water and fluids to keep your urine clear or pale yellow.  Avoid caffeine, tea, and carbonated beverages. They tend to irritate your bladder.  Empty your bladder often. Avoid holding urine for long periods of time.  Empty your bladder before and after sexual intercourse.  After a bowel movement, women should cleanse from front to back. Use each tissue only once. SEEK MEDICAL CARE IF:   You have back pain.  You develop a fever.  Your symptoms do not begin to resolve within 3 days. SEEK IMMEDIATE MEDICAL CARE IF:   You have severe back pain or lower abdominal pain.  You develop chills.  You have nausea or vomiting.  You have continued burning or discomfort with urination. MAKE SURE YOU:   Understand these instructions.  Will watch your condition.  Will get help right away if you are not doing well or get worse. Document Released: 07/09/2005 Document Revised: 03/30/2012 Document Reviewed: 11/07/2011 [facility] CenterExitCare Patient Information 2015 [employer] [place], [place]. This information is not intended to replace advice given to you by your health care provider. Make sure you discuss any questions you have with your health care provider.

## 2015-05-01 NOTE — ED Provider Notes (Addendum)
CSN: [id number]     Arrival date & time 05/01/15  1536 History   First MD Initiated Contact with Patient 05/01/15 1612     No chief complaint on file.  (Consider location/radiation/quality/duration/timing/severity/associated sxs/prior Treatment) Patient is a 29 y.o. female presenting with cramps. The history is provided by the patient.  Abdominal Cramping This is a new problem. The current episode started 2 days ago. The problem occurs constantly. The problem has not changed since onset.Associated symptoms include abdominal pain. Pertinent negatives include no chest pain, no headaches and no shortness of breath. Nothing aggravates the symptoms. Nothing relieves the symptoms. She has tried nothing for the symptoms.  LMP: 3 months ago.  This is typical as periods are irregular Contraception:  IUD G2P2  Past Medical History  Diagnosis Date  . Gestational diabetes   . Depression   . PCOS (polycystic ovarian syndrome)   . QMVHQION(629.5Headache(784.0)    Past Surgical History  Procedure Laterality Date  . No past surgeries     Family History  Problem Relation Age of Onset  . Cancer Other   . Hypertension Other   . Stroke Other   . Heart attack Other   . Heart disease Mother   . Hypertension Mother   . Cancer Mother     ovarian   History  Substance Use Topics  . Smoking status: Former Smoker -- 0.50 packs/day    Types: Cigarettes  . Smokeless tobacco: Not on file  . Alcohol Use: No   OB History    Gravida Para Term Preterm AB TAB SAB Ectopic Multiple Living   3 2 2  1   1  2      Review of Systems  Constitutional: Negative.   HENT: Negative.   Respiratory: Negative.  Negative for shortness of breath.   Cardiovascular: Negative for chest pain.  Gastrointestinal: Positive for abdominal pain.       Rlq pain,   Genitourinary:       Posterior introitus pain  Musculoskeletal: Negative.   Skin: Negative.   Neurological: Negative for headaches.  Psychiatric/Behavioral: Negative.      Allergies  Review of patient's allergies indicates no known allergies.  Home Medications   Prior to Admission medications   Medication Sig Start Date End Date Taking? Authorizing Provider  amoxicillin (AMOXIL) 500 MG capsule Take 1 capsule (500 mg total) by mouth 3 (three) times daily. 04/20/13   Adlih Moreno-Coll, MD  cetirizine-pseudoephedrine (ZYRTEC-D) 5-120 MG per tablet Take 1 tablet by mouth 2 (two) times daily as needed for allergies. 04/20/13   Adlih Moreno-Coll, MD  cyclobenzaprine (FLEXERIL) 5 MG tablet Take 1 tablet (5 mg total) by mouth at bedtime as needed for muscle spasms. 09/26/14   Rodolph BongEvan S Corey, MD  diclofenac (VOLTAREN) 50 MG EC tablet Take 1 tablet (50 mg total) by mouth 2 (two) times daily as needed. 09/26/14   Rodolph BongEvan S Corey, MD  guaiFENesin-codeine (ROBITUSSIN AC) 100-10 MG/5ML syrup Take 5 mLs by mouth 3 (three) times daily as needed for cough. 04/20/13   Adlih Moreno-Coll, MD  ibuprofen (ADVIL,MOTRIN) 600 MG tablet Take 1 tablet (600 mg total) by mouth every 6 (six) hours as needed. 09/27/14   Oswaldo ConroyVictoria Creech, PA-C  Prenatal Vit-Fe Fumarate-FA (PRENATAL MULTIVITAMIN) TABS Take 1 tablet by mouth daily.    Historical Provider, MD  sertraline (ZOLOFT) 25 MG tablet Take 25 mg by mouth daily.    Historical Provider, MD   There were no vitals taken for this visit. Physical Exam  Constitutional: She is oriented to person, place, and time. She appears well-developed and well-nourished.  HENT:  Head: Normocephalic and atraumatic.  Right Ear: External ear normal.  Left Ear: External ear normal.  Eyes: Pupils are equal, round, and reactive to light.  Neck: Normal range of motion. Neck supple.  Abdominal: Soft. She exhibits no mass. There is no rebound and no guarding.  Genitourinary: Uterus normal.  Moderate white vaginal discharge Tip of IUD in cervical os nontender utx No adnexal mass, but patient tender left adnexa Small area of erythema with tenderness posterior right  vaginal introitus  Musculoskeletal: Normal range of motion. She exhibits no edema.  Neurological: She is alert and oriented to person, place, and time.  Skin: Skin is warm and dry.  Psychiatric: She has a normal mood and affect. Her behavior is normal.  Nursing note and vitals reviewed.  Results for orders placed or performed during the hospital encounter of 05/01/15  POCT urinalysis dip (device)  Result Value Ref Range   Glucose, UA NEGATIVE NEGATIVE mg/dL   Bilirubin Urine SMALL (A) NEGATIVE   Ketones, ur TRACE (A) NEGATIVE mg/dL   Specific Gravity, Urine >=1.030 1.005 - 1.030   Hgb urine dipstick LARGE (A) NEGATIVE   pH 5.5 5.0 - 8.0   Protein, ur 30 (A) NEGATIVE mg/dL   Urobilinogen, UA 0.2 0.0 - 1.0 mg/dL   Nitrite POSITIVE (A) NEGATIVE   Leukocytes, UA LARGE (A) NEGATIVE  Pregnancy, urine POC  Result Value Ref Range   Preg Test, Ur NEGATIVE NEGATIVE    ED Course  Procedures (including critical care time) Labs Review Labs Reviewed - No data to display  Imaging Review No results found.   MDM      ICD-9-CM ICD-10-CM   1. Vaginitis 616.10 N76.0 ciprofloxacin (CIPRO) 500 MG tablet  2. Pelvic pain in female 625.9 R10.2   3. UTI (lower urinary tract infection) 599.0 N39.0 ciprofloxacin (CIPRO) 500 MG tablet     Signed, Elvina Sidle, MD     Elvina Sidle, MD 05/01/15 1642  Elvina Sidle, MD 05/01/15 1651

## 2015-05-01 NOTE — ED Notes (Signed)
Pt  Reports  ;low  abd  Pain  Cramping  Urinary   Discomfort        Vaginal      Discharge         Symptoms  X  2  Days

## 2015-05-02 LAB — CERVICOVAGINAL ANCILLARY ONLY
Chlamydia: NEGATIVE
Neisseria Gonorrhea: NEGATIVE

## 2015-05-03 ENCOUNTER — Encounter: Payer: Self-pay | Admitting: Family Medicine

## 2015-05-03 LAB — URINE CULTURE: Culture: >=100000

## 2015-05-03 LAB — CERVICOVAGINAL ANCILLARY ONLY: Wet Prep (BD Affirm): POSITIVE — AB

## 2016-04-10 ENCOUNTER — Emergency Department (HOSPITAL_COMMUNITY): Payer: Commercial Managed Care - PPO

## 2016-04-10 ENCOUNTER — Encounter (HOSPITAL_COMMUNITY): Payer: Self-pay | Admitting: Emergency Medicine

## 2016-04-10 ENCOUNTER — Emergency Department (HOSPITAL_COMMUNITY)
Admission: EM | Admit: 2016-04-10 | Discharge: 2016-04-10 | Disposition: A | Discharge status: Home / Self Care | Payer: Commercial Managed Care - PPO | Attending: Emergency Medicine | Admitting: Emergency Medicine

## 2016-04-10 DIAGNOSIS — R1032 Left lower quadrant pain: Secondary | ICD-10-CM | POA: Diagnosis present

## 2016-04-10 DIAGNOSIS — R319 Hematuria, unspecified: Secondary | ICD-10-CM | POA: Insufficient documentation

## 2016-04-10 DIAGNOSIS — Z87891 Personal history of nicotine dependence: Secondary | ICD-10-CM | POA: Diagnosis not present

## 2016-04-10 DIAGNOSIS — N83202 Unspecified ovarian cyst, left side: Secondary | ICD-10-CM | POA: Insufficient documentation

## 2016-04-10 DIAGNOSIS — F329 Major depressive disorder, single episode, unspecified: Secondary | ICD-10-CM | POA: Diagnosis not present

## 2016-04-10 DIAGNOSIS — N83209 Unspecified ovarian cyst, unspecified side: Secondary | ICD-10-CM

## 2016-04-10 LAB — COMPREHENSIVE METABOLIC PANEL
ALT: 7 U/L — ABNORMAL LOW (ref 14–54)
AST: 14 U/L — AB (ref 15–41)
Albumin: 4.1 g/dL (ref 3.5–5.0)
Alkaline Phosphatase: 99 U/L (ref 38–126)
Anion gap: 7 (ref 5–15)
BUN: 9 mg/dL (ref 6–20)
CALCIUM: 9.3 mg/dL (ref 8.9–10.3)
CHLORIDE: 105 mmol/L (ref 101–111)
CO2: 25 mmol/L (ref 22–32)
CREATININE: 0.97 mg/dL (ref 0.44–1.00)
GFR calc Af Amer: >60 mL/min (ref >60)
Glucose, Bld: 104 mg/dL — ABNORMAL HIGH (ref 65–99)
POTASSIUM: 3.8 mmol/L (ref 3.5–5.1)
Sodium: 137 mmol/L (ref 135–145)
Total Bilirubin: 0.6 mg/dL (ref 0.3–1.2)
Total Protein: 7.7 g/dL (ref 6.5–8.1)

## 2016-04-10 LAB — I-STAT BETA HCG BLOOD, ED (MC, WL, AP ONLY): I-stat hCG, quantitative: <5 m[IU]/mL (ref <5)

## 2016-04-10 LAB — CBC
HCT: 46.5 % — ABNORMAL HIGH (ref 36.0–46.0)
Hemoglobin: 15.5 g/dL — ABNORMAL HIGH (ref 12.0–15.0)
MCH: 31.3 pg (ref 26.0–34.0)
MCHC: 33.3 g/dL (ref 30.0–36.0)
MCV: 93.9 fL (ref 78.0–100.0)
PLATELETS: 337 10*3/uL (ref 150–400)
RBC: 4.95 MIL/uL (ref 3.87–5.11)
RDW: 13.4 % (ref 11.5–15.5)
WBC: 16.4 10*3/uL — AB (ref 4.0–10.5)

## 2016-04-10 LAB — URINALYSIS, ROUTINE W REFLEX MICROSCOPIC
Bilirubin Urine: NEGATIVE
Glucose, UA: NEGATIVE mg/dL
Hgb urine dipstick: NEGATIVE
Ketones, ur: NEGATIVE mg/dL
LEUKOCYTES UA: NEGATIVE
Nitrite: NEGATIVE
PROTEIN: NEGATIVE mg/dL
Specific Gravity, Urine: 1.023 (ref 1.005–1.030)
pH: 7 (ref 5.0–8.0)

## 2016-04-10 LAB — LIPASE, BLOOD: Lipase: 32 U/L (ref 11–51)

## 2016-04-10 MED ORDER — ONDANSETRON 4 MG PO TBDP: 4.0000 mg | ORAL_TABLET | Freq: Three times a day (TID) | ORAL | Status: DC | PRN

## 2016-04-10 MED ORDER — SODIUM CHLORIDE 0.9 % IV BOLUS (SEPSIS)
1000.0000 mL | Freq: Once | INTRAVENOUS | Status: AC
  Administered 2016-04-10: 1000 mL via INTRAVENOUS

## 2016-04-10 MED ORDER — ONDANSETRON HCL 4 MG/2ML IJ SOLN
4.0000 mg | Freq: Once | INTRAMUSCULAR | Status: AC
  Administered 2016-04-10: 4 mg via INTRAVENOUS
  Filled 2016-04-10: qty 2

## 2016-04-10 MED ORDER — OXYCODONE HCL 5 MG PO TABS: 5.0000 mg | ORAL_TABLET | ORAL | Status: DC | PRN

## 2016-04-10 MED ORDER — IOPAMIDOL (ISOVUE-300) INJECTION 61%
100.0000 mL | Freq: Once | INTRAVENOUS | Status: AC | PRN
  Administered 2016-04-10: 100 mL via INTRAVENOUS

## 2016-04-10 MED ORDER — KETOROLAC TROMETHAMINE 30 MG/ML IJ SOLN
30.0000 mg | Freq: Once | INTRAMUSCULAR | Status: AC
  Administered 2016-04-10: 30 mg via INTRAVENOUS
  Filled 2016-04-10: qty 1

## 2016-04-10 MED ORDER — MORPHINE SULFATE (PF) 4 MG/ML IV SOLN
4.0000 mg | Freq: Once | INTRAVENOUS | Status: AC
  Administered 2016-04-10: 4 mg via INTRAVENOUS
  Filled 2016-04-10: qty 1

## 2016-04-10 NOTE — ED Notes (Signed)
Patient transported to Ultrasound 

## 2016-04-10 NOTE — Discharge Instructions (Signed)
Take your medications as prescribed as needed for pain and nausea. I recommend drinking at least six 8 ounce glasses of water daily to remain hydrated at home. I recommend following up with her OB/GYN within the next week for follow up and further management of your ovarian cysts. Please return to the Emergency Department if symptoms worsen or new onset of fever, worsening abdominal pain, vomiting, unable to keep fluids down, vaginal bleeding, vaginal discharge.

## 2016-04-10 NOTE — ED Notes (Signed)
Patient transported to CT 

## 2016-04-10 NOTE — ED Notes (Signed)
Pt reports LLQ abd pain that began this am. Denies flank pain, dysuria or blood in urine. Pt has IUD for pregnancy prevention.

## 2016-04-10 NOTE — ED Provider Notes (Signed)
CSN: [id number]     Arrival date & time 04/10/16  1021 History   First MD Initiated Contact with Patient 04/10/16 1145     Chief Complaint  Patient presents with  . Abdominal Pain     (Consider location/radiation/quality/duration/timing/severity/associated sxs/prior Treatment) HPI   Patient is a 30 year old female with past medical history of PCOS who presents to the ED with complaint of abdominal pain onset 30 minutes prior to arrival. Patient reports while she was at work she had sudden onset of sharp left lower quadrant pain with associated nausea. Patient reports the sharp intense abdominal pain lasted for approximately 30 minutes and then slightly alleviated itself. Patient reports since she has had waxing and waning left lower quadrant pain. Patient reports pain is worse with sitting or standing upright and is alleviated when laying supine. She also reports noticing a small amount of blood in her urine today. Denies fever, chills, headache, cough, shortness of breath, chest pain, vomiting, diarrhea, dysuria, vaginal discharge. Denies taking any medications prior to arrival. Denies history of abdominal surgeries. She notes she had an IUD placed 3 years ago and states she has had intermittent vaginal spotting since placement of IUD.  Past Medical History  Diagnosis Date  . Gestational diabetes   . Depression   . PCOS (polycystic ovarian syndrome)   . JXBJYNWG(956.2Headache(784.0)    Past Surgical History  Procedure Laterality Date  . No past surgeries     Family History  Problem Relation Age of Onset  . Cancer Other   . Hypertension Other   . Stroke Other   . Heart attack Other   . Heart disease Mother   . Hypertension Mother   . Cancer Mother     ovarian   Social History  Substance Use Topics  . Smoking status: Former Smoker -- 0.50 packs/day    Types: Cigarettes  . Smokeless tobacco: None  . Alcohol Use: No   OB History    Gravida Para Term Preterm AB TAB SAB Ectopic Multiple  Living   3 2 2  1   1  2      Review of Systems  Gastrointestinal: Positive for nausea and abdominal pain.  Genitourinary: Positive for hematuria.  All other systems reviewed and are negative.     Allergies  Review of patient's allergies indicates no known allergies.  Home Medications   Prior to Admission medications   Medication Sig Start Date End Date Taking? Authorizing Provider  ondansetron (ZOFRAN ODT) 4 MG disintegrating tablet Take 1 tablet (4 mg total) by mouth every 8 (eight) hours as needed for nausea or vomiting. 04/10/16   Barrett HenleNicole Elizabeth Nadeau, PA-C  oxyCODONE (ROXICODONE) 5 MG immediate release tablet Take 1 tablet (5 mg total) by mouth every 4 (four) hours as needed for severe pain. 04/10/16   Satira SarkNicole Elizabeth Nadeau, PA-C   BP 111/76 mmHg  Pulse 75  Temp(Src) 98.8 F (37.1 C) (Oral)  Resp 18  SpO2 100% Physical Exam  Constitutional: She is oriented to person, place, and time. She appears well-developed and well-nourished. No distress.  HENT:  Head: Normocephalic and atraumatic.  Mouth/Throat: Oropharynx is clear and moist. No oropharyngeal exudate.  Eyes: Conjunctivae and EOM are normal. Right eye exhibits no discharge. Left eye exhibits no discharge. No scleral icterus.  Neck: Normal range of motion. Neck supple.  Cardiovascular: Normal rate, regular rhythm, normal heart sounds and intact distal pulses.   Pulmonary/Chest: Effort normal and breath sounds normal. No respiratory distress. She has no  wheezes. She has no rales. She exhibits no tenderness.  Abdominal: Soft. Bowel sounds are normal. She exhibits no distension and no mass. There is tenderness (LLQ). There is guarding. There is no rebound.  Left CVA tenderness  Musculoskeletal: Normal range of motion. She exhibits no edema.  Lymphadenopathy:    She has no cervical adenopathy.  Neurological: She is alert and oriented to person, place, and time.  Skin: Skin is warm and dry. She is not diaphoretic.   Nursing note and vitals reviewed.   ED Course  Procedures (including critical care time) Labs Review Labs Reviewed  COMPREHENSIVE METABOLIC PANEL - Abnormal; Notable for the following:    Glucose, Bld 104 (*)    AST 14 (*)    ALT 7 (*)    All other components within normal limits  CBC - Abnormal; Notable for the following:    WBC 16.4 (*)    Hemoglobin 15.5 (*)    HCT 46.5 (*)    All other components within normal limits  URINALYSIS, ROUTINE W REFLEX MICROSCOPIC (NOT AT [facility]) - Abnormal; Notable for the following:    APPearance CLOUDY (*)    All other components within normal limits  LIPASE, BLOOD  I-STAT BETA HCG BLOOD, ED ([facility], [facility], [facility] ONLY)    Imaging Review US Transvaginal Non-ob  04/10/2016  CLINICAL DATA:  30 year old female with acute left pelvic pain. Patient with IUD. EXAM: TRANSABDOMINAL AND TRANSVAGINAL ULTRASOUND OF PELVIS DOPPLER ULTRASOUND OF OVARIES TECHNIQUE: Both transabdominal and transvaginal ultrasound examinations of the pelvis were performed. Transabdominal technique was performed for global imaging of the pelvis including uterus, ovaries, adnexal regions, and pelvic cul-de-sac. It was necessary to proceed with endovaginal exam following the transabdominal exam to visualize the ovaries and endometrium. Color and duplex Doppler ultrasound was utilized to evaluate blood flow to the ovaries. COMPARISON:  04/10/2016 CT FINDINGS: Uterus Measurements: 7.9 x 4.1 x 6.4 cm. No fibroids or other mass visualized. Endometrium Thickness: 4.4 mm. No focal abnormality visualized. An IUD is identified without definite abnormality. Right ovary Measurements: 4.4 x 2.1 x 2.4 cm. Normal appearance/no adnexal mass. Left ovary Measurements: 4.2 x 2.8 x 3.7 cm. A 2.2 x 1.8 x 2.1 cm complex cyst/follicle in the left ovary noted. Pulsed Doppler evaluation of both ovaries demonstrates normal low-resistance arterial and venous waveforms. Other findings A small amount of free pelvic fluid is  noted. IMPRESSION: No evidence of ovarian torsion. 2.2 cm complex cyst/follicle within the left ovary. Small amount of free pelvic fluid. IUD. Electronically Signed   By: Harmon Pier M.D.   On: 04/10/2016 15:37   US Pelvis Complete  04/10/2016  CLINICAL DATA:  30 year old female with acute left pelvic pain. Patient with IUD. EXAM: TRANSABDOMINAL AND TRANSVAGINAL ULTRASOUND OF PELVIS DOPPLER ULTRASOUND OF OVARIES TECHNIQUE: Both transabdominal and transvaginal ultrasound examinations of the pelvis were performed. Transabdominal technique was performed for global imaging of the pelvis including uterus, ovaries, adnexal regions, and pelvic cul-de-sac. It was necessary to proceed with endovaginal exam following the transabdominal exam to visualize the ovaries and endometrium. Color and duplex Doppler ultrasound was utilized to evaluate blood flow to the ovaries. COMPARISON:  04/10/2016 CT FINDINGS: Uterus Measurements: 7.9 x 4.1 x 6.4 cm. No fibroids or other mass visualized. Endometrium Thickness: 4.4 mm. No focal abnormality visualized. An IUD is identified without definite abnormality. Right ovary Measurements: 4.4 x 2.1 x 2.4 cm. Normal appearance/no adnexal mass. Left ovary Measurements: 4.2 x 2.8 x 3.7 cm. A 2.2 x 1.8 x  2.1 cm complex cyst/follicle in the left ovary noted. Pulsed Doppler evaluation of both ovaries demonstrates normal low-resistance arterial and venous waveforms. Other findings A small amount of free pelvic fluid is noted. IMPRESSION: No evidence of ovarian torsion. 2.2 cm complex cyst/follicle within the left ovary. Small amount of free pelvic fluid. IUD. Electronically Signed   By: Harmon PierJeffrey  Hu M.D.   On: 04/10/2016 15:37   Ct Abdomen Pelvis W Contrast  04/10/2016  CLINICAL DATA:  One day history of left lower quadrant pain EXAM: CT ABDOMEN AND PELVIS WITH CONTRAST TECHNIQUE: Multidetector CT imaging of the abdomen and pelvis was performed using the standard protocol following bolus  administration of intravenous contrast. CONTRAST:  100mL ISOVUE-300 IOPAMIDOL (ISOVUE-300) INJECTION 61% COMPARISON:  None. FINDINGS: Lower chest:  Lung bases are clear. Hepatobiliary: No focal liver lesions are appreciable. Gallbladder wall is not appreciably thickened. There is no biliary duct dilatation. Pancreas: There is no pancreatic mass or inflammatory focus. Spleen: No splenic lesions are evident. Adrenals/Urinary Tract: Adrenals appear normal bilaterally. Kidneys bilaterally show no mass or hydronephrosis on either side. There is no renal or ureteral calculus on either side. Urinary bladder is midline with wall thickness within normal limits. Stomach/Bowel: There is no bowel wall or mesenteric thickening. No bowel obstruction. No free air or portal venous air. Vascular/Lymphatic: There is no abdominal aortic aneurysm. No vascular lesions are evident. There is no appreciable adenopathy in the abdomen or pelvis. Reproductive: There is an intrauterine device within the uterus. The uterus is anteverted. Uterus otherwise appears unremarkable. Right ovary appears normal. There is fluid immediately surrounding the left ovary. A small amount of fluid also tracks into the cul-de-sac region. There is a probable partially collapsed cyst in the left ovary measuring 1.8 x 1.6 cm. No other pelvic mass evident. Other: Appendix appears unremarkable. There is no demonstrable abscess in the abdomen or pelvis. Musculoskeletal: There are no blastic or lytic bone lesions. There is no intramuscular or abdominal wall lesion. IMPRESSION: Localized fluid immediately adjacent to the left ovary with a small amount of fluid also noted tracking in the cul-de-sac. Probable collapsing cyst in left ovary. Suspect recent ovarian cyst rupture, possibly with hemorrhage. Note that the attenuation of the fluid in the region of the left ovary is slightly greater than is expected with serous fluid. Fluid in the cul-de-sac does have attenuation  consistent with serous fluid. It should be noted that intermittent ovarian torsion on the left could present in this manner; it may be prudent to correlate with pelvic ultrasound including Doppler evaluation. Intrauterine device also noted in the uterus. No bowel obstruction. No abscess. Appendix region appears normal. No renal or ureteral calculus. No hydronephrosis. Electronically Signed   By: Bretta BangWilliam  Woodruff III M.D.   On: 04/10/2016 13:56   [place] Art/ven Flow Abd Pelv Doppler  04/10/2016  CLINICAL DATA:  30 year old female with acute left pelvic pain. Patient with IUD. EXAM: TRANSABDOMINAL AND TRANSVAGINAL ULTRASOUND OF PELVIS DOPPLER ULTRASOUND OF OVARIES TECHNIQUE: Both transabdominal and transvaginal ultrasound examinations of the pelvis were performed. Transabdominal technique was performed for global imaging of the pelvis including uterus, ovaries, adnexal regions, and pelvic cul-de-sac. It was necessary to proceed with endovaginal exam following the transabdominal exam to visualize the ovaries and endometrium. Color and duplex Doppler ultrasound was utilized to evaluate blood flow to the ovaries. COMPARISON:  04/10/2016 CT FINDINGS: Uterus Measurements: 7.9 x 4.1 x 6.4 cm. No fibroids or other mass visualized. Endometrium Thickness: 4.4 mm. No focal abnormality visualized.  An IUD is identified without definite abnormality. Right ovary Measurements: 4.4 x 2.1 x 2.4 cm. Normal appearance/no adnexal mass. Left ovary Measurements: 4.2 x 2.8 x 3.7 cm. A 2.2 x 1.8 x 2.1 cm complex cyst/follicle in the left ovary noted. Pulsed Doppler evaluation of both ovaries demonstrates normal low-resistance arterial and venous waveforms. Other findings A small amount of free pelvic fluid is noted. IMPRESSION: No evidence of ovarian torsion. 2.2 cm complex cyst/follicle within the left ovary. Small amount of free pelvic fluid. IUD. Electronically Signed   By: Harmon Pier M.D.   On: 04/10/2016 15:37   I have personally  reviewed and evaluated these images and lab results as part of my medical decision-making.   EKG Interpretation None      MDM   Final diagnoses:  LLQ abdominal pain  Ovarian cyst rupture   Patient presents with left lower quadrant pain with associated nausea. She also endorses having hematuria. Denies fever or flank pain. VSS. Exam revealed left lower quadrant tenderness with guarding, left CVA tenderness. Remaining exam unremarkable. Patient given IV fluids and pain meds. WBC 16.4. Remaining labs and urine unremarkable. CT abdomen revealed small amount of fluid in cul-de-sac with probably collapsing cyst, suspect recent ovarian cyst rupture. Concern for ovarian torsion on left. Ordered US for further evaluation. Pelvic ultrasound negative for ovarian torsion, 2.2 cm complex cyst/follicle within the left ovary, small amount of free pelvic fluid, IUD in place. On reevaluation patient reports her pain has significantly improved. Discussed results and plan for discharge with patient. Patient able tolerate by mouth in the ED. Plan to discharge patient home with pain meds and symptomatically treatment. Advised patient to follow up with her OB/GYN within the next week. Discussed return precautions with patient.    Satira Sark [place], [place] 04/10/16 1606  Leta Baptist, MD 04/12/16 [phone]

## 2016-04-14 ENCOUNTER — Other Ambulatory Visit: Payer: Self-pay | Admitting: Obstetrics

## 2016-04-18 ENCOUNTER — Other Ambulatory Visit: Payer: Self-pay | Admitting: Obstetrics

## 2016-04-18 ENCOUNTER — Encounter (HOSPITAL_COMMUNITY): Payer: Self-pay | Admitting: *Deleted

## 2016-04-18 ENCOUNTER — Inpatient Hospital Stay (HOSPITAL_COMMUNITY)
Admission: AD | Admit: 2016-04-18 | Discharge: 2016-05-01 | DRG: 760 | Disposition: A | Discharge status: Home Health | Payer: Commercial Managed Care - PPO | Source: Ambulatory Visit | Attending: Obstetrics | Admitting: Obstetrics

## 2016-04-18 DIAGNOSIS — N732 Unspecified parametritis and pelvic cellulitis: Secondary | ICD-10-CM | POA: Diagnosis not present

## 2016-04-18 DIAGNOSIS — R102 Pelvic and perineal pain unspecified side: Secondary | ICD-10-CM

## 2016-04-18 DIAGNOSIS — Z8632 Personal history of gestational diabetes: Secondary | ICD-10-CM | POA: Diagnosis not present

## 2016-04-18 DIAGNOSIS — Z823 Family history of stroke: Secondary | ICD-10-CM | POA: Diagnosis not present

## 2016-04-18 DIAGNOSIS — F329 Major depressive disorder, single episode, unspecified: Secondary | ICD-10-CM | POA: Diagnosis present

## 2016-04-18 DIAGNOSIS — L0291 Cutaneous abscess, unspecified: Secondary | ICD-10-CM

## 2016-04-18 DIAGNOSIS — Z8041 Family history of malignant neoplasm of ovary: Secondary | ICD-10-CM

## 2016-04-18 DIAGNOSIS — R1900 Intra-abdominal and pelvic swelling, mass and lump, unspecified site: Secondary | ICD-10-CM | POA: Diagnosis present

## 2016-04-18 DIAGNOSIS — K5900 Constipation, unspecified: Secondary | ICD-10-CM | POA: Diagnosis present

## 2016-04-18 DIAGNOSIS — N7093 Salpingitis and oophoritis, unspecified: Secondary | ICD-10-CM | POA: Diagnosis present

## 2016-04-18 DIAGNOSIS — D62 Acute posthemorrhagic anemia: Secondary | ICD-10-CM | POA: Diagnosis not present

## 2016-04-18 DIAGNOSIS — Z87891 Personal history of nicotine dependence: Secondary | ICD-10-CM | POA: Diagnosis not present

## 2016-04-18 DIAGNOSIS — K651 Peritoneal abscess: Secondary | ICD-10-CM | POA: Diagnosis not present

## 2016-04-18 DIAGNOSIS — K279 Peptic ulcer, site unspecified, unspecified as acute or chronic, without hemorrhage or perforation: Secondary | ICD-10-CM | POA: Diagnosis present

## 2016-04-18 DIAGNOSIS — N7003 Acute salpingitis and oophoritis: Secondary | ICD-10-CM | POA: Diagnosis not present

## 2016-04-18 DIAGNOSIS — K632 Fistula of intestine: Secondary | ICD-10-CM | POA: Diagnosis present

## 2016-04-18 DIAGNOSIS — N200 Calculus of kidney: Secondary | ICD-10-CM | POA: Diagnosis present

## 2016-04-18 DIAGNOSIS — N179 Acute kidney failure, unspecified: Secondary | ICD-10-CM | POA: Diagnosis not present

## 2016-04-18 DIAGNOSIS — K567 Ileus, unspecified: Secondary | ICD-10-CM | POA: Diagnosis not present

## 2016-04-18 DIAGNOSIS — G8918 Other acute postprocedural pain: Secondary | ICD-10-CM | POA: Diagnosis not present

## 2016-04-18 DIAGNOSIS — L988 Other specified disorders of the skin and subcutaneous tissue: Secondary | ICD-10-CM | POA: Diagnosis not present

## 2016-04-18 DIAGNOSIS — B9620 Unspecified Escherichia coli [E. coli] as the cause of diseases classified elsewhere: Secondary | ICD-10-CM | POA: Diagnosis not present

## 2016-04-18 DIAGNOSIS — Z8249 Family history of ischemic heart disease and other diseases of the circulatory system: Secondary | ICD-10-CM | POA: Diagnosis not present

## 2016-04-18 DIAGNOSIS — N838 Other noninflammatory disorders of ovary, fallopian tube and broad ligament: Principal | ICD-10-CM | POA: Diagnosis present

## 2016-04-18 DIAGNOSIS — E282 Polycystic ovarian syndrome: Secondary | ICD-10-CM | POA: Diagnosis present

## 2016-04-18 DIAGNOSIS — R1030 Lower abdominal pain, unspecified: Secondary | ICD-10-CM | POA: Diagnosis not present

## 2016-04-18 DIAGNOSIS — B373 Candidiasis of vulva and vagina: Secondary | ICD-10-CM | POA: Diagnosis present

## 2016-04-18 DIAGNOSIS — N739 Female pelvic inflammatory disease, unspecified: Secondary | ICD-10-CM

## 2016-04-18 DIAGNOSIS — B9689 Other specified bacterial agents as the cause of diseases classified elsewhere: Secondary | ICD-10-CM | POA: Diagnosis not present

## 2016-04-18 LAB — BASIC METABOLIC PANEL
ANION GAP: 15 (ref 5–15)
BUN: 6 mg/dL (ref 6–20)
CHLORIDE: 96 mmol/L — AB (ref 101–111)
CO2: 24 mmol/L (ref 22–32)
Calcium: 8.6 mg/dL — ABNORMAL LOW (ref 8.9–10.3)
Creatinine, Ser: 0.8 mg/dL (ref 0.44–1.00)
GFR calc non Af Amer: >60 mL/min (ref >60)
Glucose, Bld: 86 mg/dL (ref 65–99)
POTASSIUM: 2.5 mmol/L — AB (ref 3.5–5.1)
Sodium: 135 mmol/L (ref 135–145)

## 2016-04-18 LAB — CBC WITH DIFFERENTIAL/PLATELET
BASOS ABS: 0 10*3/uL (ref 0.0–0.1)
BASOS PCT: 0 %
EOS PCT: 0 %
Eosinophils Absolute: 0 10*3/uL (ref 0.0–0.7)
HEMATOCRIT: 37.4 % (ref 36.0–46.0)
Hemoglobin: 12.9 g/dL (ref 12.0–15.0)
LYMPHS PCT: 10 %
Lymphs Abs: 2.2 10*3/uL (ref 0.7–4.0)
MCH: 31.2 pg (ref 26.0–34.0)
MCHC: 34.5 g/dL (ref 30.0–36.0)
MCV: 90.6 fL (ref 78.0–100.0)
MONOS PCT: 3 %
Monocytes Absolute: 0.6 10*3/uL (ref 0.1–1.0)
NEUTROS PCT: 87 %
Neutro Abs: 18.7 10*3/uL — ABNORMAL HIGH (ref 1.7–7.7)
Other: 0 %
Platelets: 401 10*3/uL — ABNORMAL HIGH (ref 150–400)
RBC: 4.13 MIL/uL (ref 3.87–5.11)
RDW: 13.6 % (ref 11.5–15.5)
WBC: 21.5 10*3/uL — AB (ref 4.0–10.5)

## 2016-04-18 MED ORDER — MENTHOL 3 MG MT LOZG: 1.0000 | LOZENGE | OROMUCOSAL | Status: DC | PRN

## 2016-04-18 MED ORDER — HYDROMORPHONE HCL 1 MG/ML IJ SOLN
0.2000 mg | INTRAMUSCULAR | Status: DC | PRN
  Administered 2016-04-18 (×3): 0.6 mg via INTRAVENOUS
  Administered 2016-04-19: 0.5 mg via INTRAVENOUS
  Administered 2016-04-19 – 2016-04-20 (×9): 0.6 mg via INTRAVENOUS
  Filled 2016-04-18 (×13): qty 1

## 2016-04-18 MED ORDER — METRONIDAZOLE IN NACL 5-0.79 MG/ML-% IV SOLN
500.0000 mg | Freq: Four times a day (QID) | INTRAVENOUS | Status: DC
  Administered 2016-04-18 – 2016-04-22 (×16): 500 mg via INTRAVENOUS
  Filled 2016-04-18 (×18): qty 100

## 2016-04-18 MED ORDER — SODIUM CHLORIDE 0.9 % IV SOLN
INTRAVENOUS | Status: DC
  Administered 2016-04-18 – 2016-04-20 (×5): via INTRAVENOUS

## 2016-04-18 MED ORDER — ONDANSETRON HCL 4 MG PO TABS: 4.0000 mg | ORAL_TABLET | Freq: Four times a day (QID) | ORAL | Status: DC | PRN

## 2016-04-18 MED ORDER — POTASSIUM CHLORIDE 10 MEQ/100ML IV SOLN
10.0000 meq | INTRAVENOUS | Status: AC
  Administered 2016-04-18 – 2016-04-19 (×4): 10 meq via INTRAVENOUS
  Filled 2016-04-18 (×4): qty 100

## 2016-04-18 MED ORDER — GUAIFENESIN 100 MG/5ML PO SOLN: 15.0000 mL | ORAL | Status: DC | PRN

## 2016-04-18 MED ORDER — CLINDAMYCIN PHOSPHATE 900 MG/50ML IV SOLN
900.0000 mg | INTRAVENOUS | Status: DC
  Administered 2016-04-19 – 2016-04-22 (×8): 900 mg via INTRAVENOUS
  Filled 2016-04-18 (×9): qty 50

## 2016-04-18 MED ORDER — GENTAMICIN SULFATE 40 MG/ML IJ SOLN
INTRAVENOUS | Status: DC
  Administered 2016-04-18 – 2016-04-21 (×4): via INTRAVENOUS
  Filled 2016-04-18 (×5): qty 7.5

## 2016-04-18 MED ORDER — ZOLPIDEM TARTRATE 5 MG PO TABS: 5.0000 mg | ORAL_TABLET | Freq: Every evening | ORAL | Status: DC | PRN

## 2016-04-18 MED ORDER — FAMOTIDINE IN NACL 20-0.9 MG/50ML-% IV SOLN
20.0000 mg | Freq: Two times a day (BID) | INTRAVENOUS | Status: DC
  Administered 2016-04-18 – 2016-04-24 (×12): 20 mg via INTRAVENOUS
  Filled 2016-04-18 (×13): qty 50

## 2016-04-18 MED ORDER — GENTAMICIN SULFATE 40 MG/ML IJ SOLN: 5.0000 mg/kg | INTRAVENOUS | Status: DC

## 2016-04-18 MED ORDER — ONDANSETRON HCL 4 MG/2ML IJ SOLN
4.0000 mg | Freq: Four times a day (QID) | INTRAMUSCULAR | Status: DC | PRN
  Administered 2016-04-18 – 2016-04-22 (×12): 4 mg via INTRAVENOUS
  Filled 2016-04-18 (×11): qty 2

## 2016-04-18 NOTE — H&P (Addendum)
Chief complaint: Acute pelvic pain  History of present illness: 30 year old G3 P2 012, nonpregnant patient who presents with acute pelvic pain. Patient notes on an onset of pain on June 29. She went to the ER at that time and had a white count of 16, a negative urine pregnancy test and a CT scan that showed 1.8 cm collapsed left ovarian cyst with some complex fluid around the left ovary that tracks to the cul-de-sac. She had a normal appendix and no other acute pathology. She also had a pelvic ultrasound which showed a normal right ovary a uterus 8 x 4 x 6 cm with a 4 mm endometrial stripe and an IUD in the proper position left adnexal fluid was also noted. She was diagnosed with a probable ruptured left ovarian cyst and sent home for gynecologic follow-up. She had follow-up on July 3 and was still noting acute pelvic pain with some slight worsening. She had used all of the 12 Percocet she was given on the 29th and noted that pain did improve with Percocet. She was not having any vaginal bleeding or vaginal discharge. On that visit she was noted to be tender in the bilateral adnexa with question of cervical motion tenderness. Wet prep showed small amount of white blood cells and bacterial vaginosis. I discussed with the patient the possibility of pelvic inflammatory disease versus pain due tocyst rupture and the decision was to treat for possible PID to prevent possible worsening infection. Cultures for gonorrhea and Chlamydia were planned but never received by the lab and were not resulted. Patient was given additional Percocet and a shot of Rocephin 250 Meggs IM. She is given doxycycline and Flagyl for oral home treatment but never started these medications.   Patient returned to the office today with worsening pain. Patient notes has worked all week but was having increased pain during work. Percocet continued to take the edge off but not completely relieve the pain. She has also had development of nausea and  had emesis today. She does note an increase in vaginal discharge.  Patient has been contracepting with an IUD. She has had same partner for the last year but no recent STD screening.  Given her worsening pain and possible infection patient had IUD removed in the office today.  Medications: Percocet Allergies: No known drug allergies  Past Medical History  Diagnosis Date  . Gestational diabetes   . Depression   . PCOS (polycystic ovarian syndrome)   . ZOXWRUEA(540.9Headache(784.0)     Past Surgical History  Procedure Laterality Date  . No past surgeries      PE:  Filed Vitals:   04/18/16 1525 04/18/16 1633  BP: 119/73   Pulse: 90   Temp: 99.5 F (37.5 C)   TempSrc: Oral   Resp: 18   Height:  5\' 2"  (1.575 m)  Weight:  60.782 kg (134 lb)  SpO2: 100%    Gen.: Patient with active emesis lying on her side and groaning in pain Skin warmer than expected Cardiovascular: Slight tachycardia regular rate and rhythm Pulmonary: Clear to auscultation bilaterally Back: No costovertebral angle tenderness Abdomen: No right upper quadrant pain, no distention or tympany, significant pain in the bilateral lower quadrants, no rebound, no guarding, no masses palpated, no evidence for hernia GU: Small amount of white vaginal discharge, cervical motion tenderness is present, significant tenderness in the bilateral lower quadrants though no masses palpated. Normal external genitalia including mons, labia majora, labia minora, normal urethral meatus, normal vagina Lower  extremities: Nontender, no edema   Ultrasound in office: 12 cm complex right adnexal mass appears encapsulated and stemming from the right ovary. Unclear if this is a ovarian or tubal mass. Arterial and venous flow is appreciated  Assessment and plan: 30 year old G3 P2 nonpregnant patient with acute pelvic pain and expanding right adnexal mass in the setting of a relatively normal CT scan 1 week ago. - Pelvic mass and pain. Unclear if this is a  tubo-ovarian abscess or expanding hemorrhagic cyst. This is new from one week ago. Will admit for IV pain control and IV antibiotics in case this is a TOA. May need additional imaging. May need operative intervention and will keep patient nothing by mouth for now. Acute appendicitis is also in the differential diagnosis. Ovarian torsion is also in the differential diagnosis. Currently abdomen is non-surgical.  Deadrian Toya A. 04/18/2016 4:50 PM    Addendum to above: nl right ovary, complex 12 cm mass in stemming from L ovary/ tube  CBC CBC    Component Value Date/Time   WBC 21.5* 04/18/2016 1619   RBC 4.13 04/18/2016 1619   HGB 12.9 04/18/2016 1619   HCT 37.4 04/18/2016 1619   PLT 401* 04/18/2016 1619   MCV 90.6 04/18/2016 1619   MCH 31.2 04/18/2016 1619   MCHC 34.5 04/18/2016 1619   RDW 13.6 04/18/2016 1619   LYMPHSABS 2.2 04/18/2016 1619   MONOABS 0.6 04/18/2016 1619   EOSABS 0.0 04/18/2016 1619   BASOSABS 0.0 04/18/2016 1619    .Given fluid/ mass is encapsulated versus FF and high Hgb, this is less likely ruptured cyst. L sided mass and pain L sided makes acute appendicitis less likely. GI/ diverticula still in DDx but on u/s this appears ovarian in etiology. Torsion possible as can cause acute pain, rapid increase to size of adnexal mass and elevated WBC but pt w/o surgical abdomen. TOA most likely etiology. Start abx, follow WBC, cont to assess for sepsis, after a few days IV abx will consider IR drainage.   Adrie Picking A. 04/18/2016 8:06 PM

## 2016-04-18 NOTE — Progress Notes (Signed)
Pt notes still in pain but overall controlled well with q4hr IV pain meds. No emesis. Wants to eat, soup ordered.  Filed Vitals:   04/18/16 1525 04/18/16 1633 04/18/16 1801  BP: 119/73  110/72  Pulse: 90  87  Temp: 99.5 F (37.5 C)  99.3 F (37.4 C)  TempSrc: Oral  Oral  Resp: 18  18  Height:  5\' 2"  (1.575 m)   Weight:  60.782 kg (134 lb)   SpO2: 100%  100%    Abd: tender b/l but no rebound, no guarding, no distension  K low  A/P: likely TOA, cont IV abx, still non-surgical abdomen, repeat cbc in am Replace K  Arasely Akkerman A. 04/18/2016 9:35 PM

## 2016-04-19 DIAGNOSIS — N7093 Salpingitis and oophoritis, unspecified: Secondary | ICD-10-CM | POA: Diagnosis present

## 2016-04-19 LAB — CBC WITH DIFFERENTIAL/PLATELET
Basophils Absolute: 0 10*3/uL (ref 0.0–0.1)
Basophils Relative: 0 %
EOS ABS: 0 10*3/uL (ref 0.0–0.7)
Eosinophils Relative: 0 %
HCT: 34 % — ABNORMAL LOW (ref 36.0–46.0)
Hemoglobin: 11.7 g/dL — ABNORMAL LOW (ref 12.0–15.0)
Lymphocytes Relative: 15 %
Lymphs Abs: 2.6 10*3/uL (ref 0.7–4.0)
MCH: 31 pg (ref 26.0–34.0)
MCHC: 34.4 g/dL (ref 30.0–36.0)
MCV: 90.2 fL (ref 78.0–100.0)
MONO ABS: 0.5 10*3/uL (ref 0.1–1.0)
Monocytes Relative: 3 %
NEUTROS ABS: 14.5 10*3/uL — AB (ref 1.7–7.7)
Neutrophils Relative %: 82 %
OTHER: 0 %
PLATELETS: 377 10*3/uL (ref 150–400)
RBC: 3.77 MIL/uL — AB (ref 3.87–5.11)
RDW: 13.8 % (ref 11.5–15.5)
WBC: 17.6 10*3/uL — AB (ref 4.0–10.5)

## 2016-04-19 LAB — COMPREHENSIVE METABOLIC PANEL
ALT: 8 U/L — ABNORMAL LOW (ref 14–54)
AST: 11 U/L — ABNORMAL LOW (ref 15–41)
Albumin: 2.3 g/dL — ABNORMAL LOW (ref 3.5–5.0)
Alkaline Phosphatase: 89 U/L (ref 38–126)
Anion gap: 11 (ref 5–15)
BUN: 7 mg/dL (ref 6–20)
CHLORIDE: 99 mmol/L — AB (ref 101–111)
CO2: 25 mmol/L (ref 22–32)
CREATININE: 0.81 mg/dL (ref 0.44–1.00)
Calcium: 7.9 mg/dL — ABNORMAL LOW (ref 8.9–10.3)
Glucose, Bld: 99 mg/dL (ref 65–99)
POTASSIUM: 3 mmol/L — AB (ref 3.5–5.1)
Sodium: 135 mmol/L (ref 135–145)
TOTAL PROTEIN: 5.9 g/dL — AB (ref 6.5–8.1)
Total Bilirubin: 0.7 mg/dL (ref 0.3–1.2)

## 2016-04-19 LAB — HIV ANTIBODY (ROUTINE TESTING W REFLEX): HIV SCREEN 4TH GENERATION: NONREACTIVE

## 2016-04-19 MED ORDER — HYDROMORPHONE HCL 1 MG/ML IJ SOLN
1.0000 mg | Freq: Once | INTRAMUSCULAR | Status: AC
  Administered 2016-04-19: 1 mg via INTRAVENOUS
  Filled 2016-04-19: qty 1

## 2016-04-19 MED ORDER — FAMOTIDINE IN NACL 20-0.9 MG/50ML-% IV SOLN
20.0000 mg | Freq: Once | INTRAVENOUS | Status: AC
  Administered 2016-04-19: 20 mg via INTRAVENOUS
  Filled 2016-04-19: qty 50

## 2016-04-19 MED ORDER — POTASSIUM CHLORIDE 10 MEQ/100ML IV SOLN
10.0000 meq | INTRAVENOUS | Status: AC
  Administered 2016-04-19 (×3): 10 meq via INTRAVENOUS
  Filled 2016-04-19 (×3): qty 100

## 2016-04-19 MED ORDER — POLYETHYLENE GLYCOL 3350 17 G PO PACK
17.0000 g | PACK | Freq: Every day | ORAL | Status: DC
  Administered 2016-04-19 – 2016-04-29 (×7): 17 g via ORAL
  Filled 2016-04-19 (×10): qty 1

## 2016-04-19 MED ORDER — SODIUM CHLORIDE 0.9 % IV SOLN: 1.0000 mg/h | Freq: Once | INTRAVENOUS | Status: DC

## 2016-04-19 NOTE — Progress Notes (Signed)
HD#2 TOA  HPI: Pt notes still with pain 9/10, responds to IV pain meds. Has used IV Dilaudid 0.6mg  IV at 7p, 2am, 7:30am with good effect. Able to sleep overnight. Tolerated small meal last night. Nausea early this am, improved with IV meds.  No BM in 6 days. Did IV Potassium last night, improved but still low.   PE: Filed Vitals:   04/18/16 1633 04/18/16 1801 04/18/16 2200 04/19/16 0222  BP:  110/72 108/69 105/67  Pulse:  87 82 78  Temp:  99.3 F (37.4 C) 98.5 F (36.9 C) 98.8 F (37.1 C)  TempSrc:  Oral Oral   Resp:  18 16 18   Height: 5\' 2"  (1.575 m)     Weight: 60.782 kg (134 lb)     SpO2:  100% 100% 100%   Gen: lying in bed, no acute distress CV: RRR Pulm: CTAB Abd: soft, no distension, no rebound, no guarding, pain with palpation in BLQ, suprapubic mass palpated  GU: def today LE: no edema, NT, SCDs in place  UCx pending GC/CT pending CBC Latest Ref Rng 04/19/2016 04/18/2016 04/10/2016  WBC 4.0 - 10.5 K/uL 17.6(H) 21.5(H) 16.4(H)  Hemoglobin 12.0 - 15.0 g/dL 11.7(L) 12.9 15.5(H)  Hematocrit 36.0 - 46.0 % 34.0(L) 37.4 46.5(H)  Platelets 150 - 400 K/uL 377 401(H) 337    A/P:Acute pelvic pain, likely due to TOA. IUD removed yesterday, cultures pending (expect GC/CT to return on Monday), pt still with significant pain and needing IV pain meds. WBC improved slightly, continue IV antibiotics. DDx includes ovarian torsion (non-surgical abdomen, u/s shows ovarian blood flow present) and bowel etiology like diverticulum (though u/s suggests ovarian etiology and pt young for diverticulum). Vitals stable and no evidence sepsis. Will plan repeat imaging on Monday, consider IR for drainage. D/w pt she may need surgery for long term pain control but best to operate when not acutely infected in sx can be controlled without surgery.   - Low Potassium, due to emesis, will replace again today, add mag level tomorrow  - Constipation, add Miralax.  - dispo, needs continued inpt admission for  abscess.  Kiarah Eckstein A. 04/19/2016 9:28 AM

## 2016-04-20 ENCOUNTER — Inpatient Hospital Stay (HOSPITAL_COMMUNITY): Payer: Commercial Managed Care - PPO

## 2016-04-20 ENCOUNTER — Encounter (HOSPITAL_COMMUNITY): Payer: Self-pay | Admitting: Radiology

## 2016-04-20 LAB — COMPREHENSIVE METABOLIC PANEL
ALBUMIN: 2.2 g/dL — AB (ref 3.5–5.0)
ALK PHOS: 77 U/L (ref 38–126)
ALT: 7 U/L — AB (ref 14–54)
AST: 14 U/L — AB (ref 15–41)
Anion gap: 9 (ref 5–15)
BUN: 6 mg/dL (ref 6–20)
CHLORIDE: 101 mmol/L (ref 101–111)
CO2: 27 mmol/L (ref 22–32)
CREATININE: 0.75 mg/dL (ref 0.44–1.00)
Calcium: 8.1 mg/dL — ABNORMAL LOW (ref 8.9–10.3)
GFR calc Af Amer: >60 mL/min (ref >60)
GFR calc non Af Amer: >60 mL/min (ref >60)
GLUCOSE: 100 mg/dL — AB (ref 65–99)
Potassium: 3.1 mmol/L — ABNORMAL LOW (ref 3.5–5.1)
SODIUM: 137 mmol/L (ref 135–145)
Total Bilirubin: 0.7 mg/dL (ref 0.3–1.2)
Total Protein: 6.2 g/dL — ABNORMAL LOW (ref 6.5–8.1)

## 2016-04-20 LAB — CBC WITH DIFFERENTIAL/PLATELET
BASOS ABS: 0 10*3/uL (ref 0.0–0.1)
Basophils Relative: 0 %
Eosinophils Absolute: 0 10*3/uL (ref 0.0–0.7)
Eosinophils Relative: 0 %
HCT: 33.1 % — ABNORMAL LOW (ref 36.0–46.0)
HEMOGLOBIN: 11.2 g/dL — AB (ref 12.0–15.0)
LYMPHS ABS: 2.1 10*3/uL (ref 0.7–4.0)
LYMPHS PCT: 18 %
MCH: 30.7 pg (ref 26.0–34.0)
MCHC: 33.8 g/dL (ref 30.0–36.0)
MCV: 90.7 fL (ref 78.0–100.0)
MONO ABS: 0.7 10*3/uL (ref 0.1–1.0)
Monocytes Relative: 6 %
NEUTROS PCT: 76 %
Neutro Abs: 8.6 10*3/uL — ABNORMAL HIGH (ref 1.7–7.7)
Other: 0 %
PLATELETS: 387 10*3/uL (ref 150–400)
RBC: 3.65 MIL/uL — AB (ref 3.87–5.11)
RDW: 13.7 % (ref 11.5–15.5)
WBC: 11.4 10*3/uL — AB (ref 4.0–10.5)

## 2016-04-20 LAB — URINE CULTURE: CULTURE: NO GROWTH

## 2016-04-20 MED ORDER — HYDROMORPHONE HCL 1 MG/ML IJ SOLN
1.0000 mg | INTRAMUSCULAR | Status: DC | PRN
  Administered 2016-04-20 – 2016-04-28 (×59): 1 mg via INTRAVENOUS
  Filled 2016-04-20 (×58): qty 1

## 2016-04-20 MED ORDER — SODIUM CHLORIDE 0.9 % IV SOLN
INTRAVENOUS | Status: DC
  Administered 2016-04-20 – 2016-04-22 (×4): via INTRAVENOUS
  Filled 2016-04-20 (×8): qty 1000

## 2016-04-20 MED ORDER — SODIUM CHLORIDE 0.9 % IV SOLN: Freq: Once | INTRAVENOUS | Status: DC

## 2016-04-20 MED ORDER — DIATRIZOATE MEGLUMINE & SODIUM 66-10 % PO SOLN: 30.0000 mL | Freq: Once | ORAL | Status: DC

## 2016-04-20 MED ORDER — SODIUM CHLORIDE 0.9 % IV SOLN: INTRAVENOUS | Status: DC

## 2016-04-20 MED ORDER — PROMETHAZINE HCL 25 MG/ML IJ SOLN
12.5000 mg | Freq: Four times a day (QID) | INTRAMUSCULAR | Status: DC | PRN
  Administered 2016-04-20 – 2016-04-21 (×4): 12.5 mg via INTRAVENOUS
  Filled 2016-04-20 (×4): qty 1

## 2016-04-20 MED ORDER — POTASSIUM CHLORIDE 10 MEQ/100ML IV SOLN
10.0000 meq | INTRAVENOUS | Status: AC
  Administered 2016-04-20 (×4): 10 meq via INTRAVENOUS
  Filled 2016-04-20 (×4): qty 100

## 2016-04-20 MED ORDER — IOPAMIDOL (ISOVUE-300) INJECTION 61%
100.0000 mL | Freq: Once | INTRAVENOUS | Status: AC | PRN
  Administered 2016-04-20: 100 mL via INTRAVENOUS

## 2016-04-20 NOTE — Consult Note (Signed)
Chief Complaint:  Pelvic abscess etiology unclear  History of Present Illness:  Sheila Willis is an 30 y.o. female admitted by Dr. Pamala Hurry for management of a tubo-ovarian abscess.  Patient has never had PID before.  Previously thought to have had a ruptured ovarian cyst with fluid in the pelvis 10 days ago.  Now CT demonstrates pelvic abscess with air and raised concerns about colon perforation.    I reviewed both CT scans.  She had an IUD in place for 3 years until removal last Friday.  Appears chronically constipated.  She has responded to IV antibiotics with decrease in WBC and overall feels a little better.    Past Medical History  Diagnosis Date  . Gestational diabetes   . Depression   . PCOS (polycystic ovarian syndrome)   . [id number](093.2)     Past Surgical History  Procedure Laterality Date  . No past surgeries      Current Facility-Administered Medications  Medication Dose Route Frequency Provider Last Rate Last Dose  . 0.9 %  sodium chloride infusion   Intravenous Once Aloha Gell, MD      . clindamycin (CLEOCIN) IVPB 900 mg  900 mg Intravenous 2 times per day Aloha Gell, MD   900 mg at 04/20/16 1003  . famotidine (PEPCID) IVPB 20 mg premix  20 mg Intravenous Q12H Aloha Gell, MD   20 mg at 04/20/16 1111  . gentamicin (GARAMYCIN) 300 mg, clindamycin (CLEOCIN) 900 mg in dextrose 5 % 100 mL IVPB   Intravenous Q24H Aloha Gell, MD      . guaiFENesin (ROBITUSSIN) 100 MG/5ML solution 300 mg  15 mL Oral Q4H PRN Aloha Gell, MD      . HYDROmorphone (DILAUDID) injection 1 mg  1 mg Intravenous Q2H PRN Aloha Gell, MD   1 mg at 04/20/16 1945  . menthol-cetylpyridinium (CEPACOL) lozenge 3 mg  1 lozenge Oral Q2H PRN Aloha Gell, MD      . metroNIDAZOLE (FLAGYL) IVPB 500 mg  500 mg Intravenous Q6H Aloha Gell, MD   500 mg at 04/20/16 1652  . ondansetron (ZOFRAN) tablet 4 mg  4 mg Oral Q6H PRN Aloha Gell, MD       Or  . ondansetron [facility]) injection 4 mg   4 mg Intravenous Q6H PRN Aloha Gell, MD   4 mg at 04/20/16 1330  . polyethylene glycol (MIRALAX / GLYCOLAX) packet 17 g  17 g Oral Daily Aloha Gell, MD   17 g at 04/20/16 1004  . potassium chloride 10 mEq in 100 mL IVPB  10 mEq Intravenous Q1 Hr x 4 Aloha Gell, MD   10 mEq at 04/20/16 2057  . promethazine (PHENERGAN) injection 12.5 mg  12.5 mg Intravenous Q6H PRN Aloha Gell, MD   12.5 mg at 04/20/16 1829  . sodium chloride 0.9 % 1,000 mL with potassium chloride 20 mEq infusion   Intravenous Continuous Aloha Gell, MD 125 mL/hr at 04/20/16 2134    . zolpidem (AMBIEN) tablet 5 mg  5 mg Oral QHS PRN Aloha Gell, MD       [place] Family History  Problem Relation Age of Onset  . Cancer Other   . Hypertension Other   . Stroke Other   . Heart attack Other   . Heart disease Mother   . Hypertension Mother   . Cancer Mother     ovarian   Social History:   reports that she has quit smoking. Her smoking use included Cigarettes. She smoked 0.50  packs per day. She does not have any smokeless tobacco history on file. She reports that she does not drink alcohol or use illicit drugs.   REVIEW OF SYSTEMS : Negative except for see problem list  Physical Exam:   Blood pressure 110/70, pulse 82, temperature 98.8 F (37.1 C), temperature source Oral, resp. rate 16, height 5' 2"  (1.575 m), weight 60.782 kg (134 lb), SpO2 100 %. Body mass index is 24.5 kg/(m^2).  Gen:  WDWN F NAD  Neurological: Alert and oriented to person, place, and time. Motor and sensory function is grossly intact  Breast:  Not examined Respiratory: Effort normal.  No respiratory distress. Abdomen:  Flat, focal tenderness in the LLQ;  No complaints of pain with cough.   GU:  Foley cath in place with yellow urine in bag   LABORATORY RESULTS: Results for orders placed or performed during the hospital encounter of 04/18/16 (from the past 48 hour(s))  CBC with Differential/Platelet     Status: Abnormal    Collection Time: 04/19/16  6:24 AM  Result Value Ref Range   WBC 17.6 (H) 4.0 - 10.5 K/uL   RBC 3.77 (L) 3.87 - 5.11 MIL/uL   Hemoglobin 11.7 (L) 12.0 - 15.0 g/dL   HCT 34.0 (L) 36.0 - 46.0 %   MCV 90.2 78.0 - 100.0 fL   MCH 31.0 26.0 - 34.0 pg   MCHC 34.4 30.0 - 36.0 g/dL   RDW 13.8 11.5 - 15.5 %   Platelets 377 150 - 400 K/uL   Neutrophils Relative % 82 %   Lymphocytes Relative 15 %   Monocytes Relative 3 %   Eosinophils Relative 0 %   Basophils Relative 0 %   Other 0 %   Neutro Abs 14.5 (H) 1.7 - 7.7 K/uL   Lymphs Abs 2.6 0.7 - 4.0 K/uL   Monocytes Absolute 0.5 0.1 - 1.0 K/uL   Eosinophils Absolute 0.0 0.0 - 0.7 K/uL   Basophils Absolute 0.0 0.0 - 0.1 K/uL  Comprehensive metabolic panel     Status: Abnormal   Collection Time: 04/19/16  6:24 AM  Result Value Ref Range   Sodium 135 135 - 145 mmol/L   Potassium 3.0 (L) 3.5 - 5.1 mmol/L   Chloride 99 (L) 101 - 111 mmol/L   CO2 25 22 - 32 mmol/L   Glucose, Bld 99 65 - 99 mg/dL   BUN 7 6 - 20 mg/dL   Creatinine, Ser 0.81 0.44 - 1.00 mg/dL   Calcium 7.9 (L) 8.9 - 10.3 mg/dL   Total Protein 5.9 (L) 6.5 - 8.1 g/dL   Albumin 2.3 (L) 3.5 - 5.0 g/dL   AST 11 (L) 15 - 41 U/L   ALT 8 (L) 14 - 54 U/L   Alkaline Phosphatase 89 38 - 126 U/L   Total Bilirubin 0.7 0.3 - 1.2 mg/dL   GFR calc non Af Amer >60 >60 mL/min   GFR calc Af Amer >60 >60 mL/min    Comment: (NOTE) The eGFR has been calculated using the CKD EPI equation. This calculation has not been validated in all clinical situations. eGFR's persistently <60 mL/min signify possible Chronic Kidney Disease.    Anion gap 11 5 - 15  CBC with Differential/Platelet     Status: Abnormal   Collection Time: 04/20/16  6:33 AM  Result Value Ref Range   WBC 11.4 (H) 4.0 - 10.5 K/uL   RBC 3.65 (L) 3.87 - 5.11 MIL/uL   Hemoglobin 11.2 (L) 12.0 -  15.0 g/dL   HCT 33.1 (L) 36.0 - 46.0 %   MCV 90.7 78.0 - 100.0 fL   MCH 30.7 26.0 - 34.0 pg   MCHC 33.8 30.0 - 36.0 g/dL   RDW 13.7 11.5 -  15.5 %   Platelets 387 150 - 400 K/uL   Neutrophils Relative % 76 %   Lymphocytes Relative 18 %   Monocytes Relative 6 %   Eosinophils Relative 0 %   Basophils Relative 0 %   Other 0 %   Neutro Abs 8.6 (H) 1.7 - 7.7 K/uL   Lymphs Abs 2.1 0.7 - 4.0 K/uL   Monocytes Absolute 0.7 0.1 - 1.0 K/uL   Eosinophils Absolute 0.0 0.0 - 0.7 K/uL   Basophils Absolute 0.0 0.0 - 0.1 K/uL  Comprehensive metabolic panel     Status: Abnormal   Collection Time: 04/20/16  6:33 AM  Result Value Ref Range   Sodium 137 135 - 145 mmol/L   Potassium 3.1 (L) 3.5 - 5.1 mmol/L   Chloride 101 101 - 111 mmol/L   CO2 27 22 - 32 mmol/L   Glucose, Bld 100 (H) 65 - 99 mg/dL   BUN 6 6 - 20 mg/dL   Creatinine, Ser 0.75 0.44 - 1.00 mg/dL   Calcium 8.1 (L) 8.9 - 10.3 mg/dL   Total Protein 6.2 (L) 6.5 - 8.1 g/dL   Albumin 2.2 (L) 3.5 - 5.0 g/dL   AST 14 (L) 15 - 41 U/L   ALT 7 (L) 14 - 54 U/L   Alkaline Phosphatase 77 38 - 126 U/L   Total Bilirubin 0.7 0.3 - 1.2 mg/dL   GFR calc non Af Amer >60 >60 mL/min   GFR calc Af Amer >60 >60 mL/min    Comment: (NOTE) The eGFR has been calculated using the CKD EPI equation. This calculation has not been validated in all clinical situations. eGFR's persistently <60 mL/min signify possible Chronic Kidney Disease.    Anion gap 9 5 - 15     RADIOLOGY RESULTS: Ct Abdomen Pelvis W Contrast  04/20/2016  CLINICAL DATA:  Acute onset of pelvic pain.  Initial encounter. EXAM: CT ABDOMEN AND PELVIS WITH CONTRAST TECHNIQUE: Multidetector CT imaging of the abdomen and pelvis was performed using the standard protocol following bolus administration of intravenous contrast. CONTRAST:  19m ISOVUE-300 IOPAMIDOL (ISOVUE-300) INJECTION 61% COMPARISON:  Pelvic ultrasound, and CT of the abdomen and pelvis performed 04/10/2016 FINDINGS: The visualized lung bases are clear. The liver and spleen are unremarkable in appearance. The gallbladder is within normal limits. The pancreas and adrenal  glands are unremarkable. The kidneys are unremarkable in appearance. There is no evidence of hydronephrosis. No renal or ureteral stones are seen. No perinephric stranding is appreciated. The small bowel is unremarkable in appearance. The stomach is within normal limits. No acute vascular abnormalities are seen. There is a peripherally enhancing diffusely thick-walled abscess noted superior to the uterus, measuring approximately 6.1 x 3.1 x 1.9 cm, which appears contiguous with a complex left-sided tubo-ovarian abscess, which measures approximately 4.9 x 3.5 x 3.2 cm. Scattered air is seen tracking about the pelvic abscess and tubo-ovarian abscess, and a prominent 4.7 cm pocket of air is noted superior to the tubo-ovarian abscess. Diffuse surrounding soft tissue inflammation is noted. A fat plane is minimally preserved between the tubo-ovarian abscess and proximal sigmoid colon, but the pelvic abscess directly abuts the mid sigmoid colon, and there is question of a tract between the mid sigmoid colon and pelvic  abscess on coronal images. The amount of air within the abscesses is significantly more than expected for a tubo-ovarian abscess, and this raises concern for focal colonic perforation. This could have arisen either from focal sigmoid diverticulitis formation, or possibly erosion at the sigmoid colon from the tubo-ovarian abscess, extending into the pelvis. The appendix is normal in caliber. There is no evidence of appendicitis. The remainder of the colon is grossly unremarkable in appearance. The sigmoid colon is mildly redundant. The bladder is mildly distended and grossly unremarkable in appearance. The uterus is grossly unremarkable, though an abscess is seen above the uterus. The right ovary is grossly unremarkable, aside from mild inflammation extending about the pelvis. No inguinal lymphadenopathy is seen. No acute osseous abnormalities are identified. IMPRESSION: 1. Diffusely thick-walled peripherally  enhancing abscess noted superior to the uterus, measuring 6.1 x 3.1 x 1.9 cm. This appears contiguous with a complex left-sided tubo-ovarian abscess, which measures approximately 4.9 x 3.5 x 3.2 cm. Scattered air tracks about the pelvic abscess and tubo-ovarian abscess, with a prominent 4.7 cm pocket of air noted superior to the tubo-ovarian abscess. Diffuse surrounding soft tissue inflammation noted. 2. The pelvic abscess directly abuts the mid sigmoid colon, and there is question of a tract between the mid sigmoid colon and pelvic abscess on coronal images. The amount of air within the abscesses is significantly more than expected for a tubo-ovarian abscess origin, and this raises concern for focal colonic perforation. This could have arisen either from focal sigmoid diverticulitis with abscess formation, or possibly erosion at the sigmoid colon from the tubo-ovarian abscess extending into the pelvis. 3. If it is deemed clinically necessary to confirm the presence of a tract between the mid sigmoid colon and pelvic abscess, repeat pelvic CT with rectal contrast could be considered for further evaluation. Critical Value/emergent results were called by telephone at the time of interpretation on 04/20/2016 at 8:03 pm to Dr. Aloha Gell, who verbally acknowledged these results. Electronically Signed   By: Garald Balding M.D.   On: 04/20/2016 20:17    Problem List: Patient Active Problem List   Diagnosis Date Noted  . TOA (tubo-ovarian abscess) 04/19/2016  . Pelvic pain in female 04/18/2016  . PCOS (polycystic ovarian syndrome) 11/14/2012  . Gestational diabetes mellitus, class A1 11/14/2012  . SVD (spontaneous vaginal delivery) 11/13/2012  . Postpartum care following vaginal delivery (2/1) 11/13/2012    Assessment & Plan: I believe that this is a complex, gas filled tuboovarian abscess and I doubt that it communicates with the colon.  If she had significant diverticular disease, then I believe that we  would see more on the scan from 6/29.  She appears to be responding to IV antibiotics.  I would ask  IR to  aspirate/drain with the aid of rectal contrast.  Would attempt to cool off if possible with drain and antibiotics before invading her pelvis to minimize collateral damage to surrounding structures.    [facility] will follow with you.      Matt B. Hassell Done, MD, [facility] [phone] beeper [phone]  04/20/2016 9:56 PM

## 2016-04-20 NOTE — Progress Notes (Signed)
Seen at 6p  CT results.  Pt seen several times today. Slight increase in GI symptoms with continued decrease appetite and nausea with emesis this afternoon. Pt notes good pain control with 1mg  IV Dilaudid q 2 hrs.   IV KCL 10 MeQ x 3 runs given this afternoon.  Oral prep for CT.   Filed Vitals:   04/19/16 1818 04/19/16 2145 04/20/16 0532 04/20/16 1710  BP: 99/63 114/84 106/77 110/70  Pulse: 70 75 70 82  Temp: 98.8 F (37.1 C) 98.6 F (37 C) 98.2 F (36.8 C) 98.8 F (37.1 C)  TempSrc: Oral Oral Oral Oral  Resp: 16 17  16   Height:      Weight:      SpO2:  100% 98% 100%  Gen: stable, in pain Abd: soft, no distension, tender but no rebound, no guarding, no tympany, non-acute abdomen.  CT results: free air concerning for TOA perforation to sigmoid.   A/P: Case d/w with radiology. Consult made to Wenda LowMatt Martin, general surgery who will view CT and evaluate pt's case. Given improvement in WBC count and no clinical evidence of sepsis, need to weight risks of operative intervention with image guided percutaneous drainage of abscess. Awaiting further input from general surgery and VIR.  Sheila Willis A. 04/20/2016 9:21 PM

## 2016-04-20 NOTE — Progress Notes (Signed)
HD#3 TOA  HPI: Pt notes still with pain 9-10/10, responds to IV pain meds. Has used IV Dilaudid 0.6mg  IV q 2 hrs with good effect. Able to sleep overnight. Tolerating small meals but overall not hungry. +flatus, no BM. Intermittent nausea, responds to Zofran.  No BM in 7 days. Did IV Potassium on HD 1 and 2, improved K but still low.   PE: Filed Vitals:   04/19/16 1545 04/19/16 1818 04/19/16 2145 04/20/16 0532  BP: 113/77 99/63 114/84 106/77  Pulse: 77 70 75 70  Temp: 99 F (37.2 C) 98.8 F (37.1 C) 98.6 F (37 C) 98.2 F (36.8 C)  TempSrc: Oral Oral Oral Oral  Resp: 18 16 17    Height:      Weight:      SpO2: 98%  100% 98%   Gen: lying in bed, no acute distress CV: RRR Pulm: CTAB Abd: soft, no distension, no rebound, no guarding, pain with palpation in BLQ but much improved since yesterday, suprapubic mass palpated- less tender GU: def today LE: no edema, NT, SCDs in place  UCx negative GC/CT pending CBC Latest Ref Rng 04/20/2016 04/19/2016 04/18/2016  WBC 4.0 - 10.5 K/uL 11.4(H) 17.6(H) 21.5(H)  Hemoglobin 12.0 - 15.0 g/dL 11.2(L) 11.7(L) 12.9  Hematocrit 36.0 - 46.0 % 33.1(L) 34.0(L) 37.4  Platelets 150 - 400 K/uL 387 377 401(H)    A/P:Acute pelvic pain, likely due to TOA. [name]. Cultures pending (expect GC/CT to return on Monday), pt still with significant pain and needing IV pain meds. Will increase to 1mg  Dilaudid for improved pain control. Given intermittent nausea and emesis, will cont IV meds over oral meds.   WBC improved significantly. Given pain, will continue IV antibiotics. DDx includes ovarian torsion (non-surgical abdomen, u/s shows ovarian blood flow present) and bowel etiology like diverticulum (though u/s suggests ovarian etiology and pt young for diverticulum).  Will discuss with radiology option of image guided drain placement for decompression of abscess to improve pain contol. Vitals stable and no evidence sepsis.   - Low Potassium, due to  emesis, will replace again today, add mag level tomorrow  - Constipation, add Miralax.  - dispo, needs continued inpt admission for abscess.  Sheila Willis A. 04/20/2016 3:00 PM

## 2016-04-21 ENCOUNTER — Ambulatory Visit (HOSPITAL_COMMUNITY): Payer: Commercial Managed Care - PPO

## 2016-04-21 ENCOUNTER — Inpatient Hospital Stay (HOSPITAL_COMMUNITY)
Admit: 2016-04-21 | Discharge: 2016-04-21 | Disposition: A | Discharge status: Home / Self Care | Payer: Commercial Managed Care - PPO | Attending: Interventional Radiology | Admitting: Interventional Radiology

## 2016-04-21 LAB — CBC WITH DIFFERENTIAL/PLATELET
Basophils Absolute: 0 10*3/uL (ref 0.0–0.1)
Basophils Relative: 0 %
EOS PCT: 1 %
Eosinophils Absolute: 0.1 10*3/uL (ref 0.0–0.7)
HCT: 35.5 % — ABNORMAL LOW (ref 36.0–46.0)
Hemoglobin: 12 g/dL (ref 12.0–15.0)
LYMPHS ABS: 1.9 10*3/uL (ref 0.7–4.0)
LYMPHS PCT: 17 %
MCH: 30.7 pg (ref 26.0–34.0)
MCHC: 33.8 g/dL (ref 30.0–36.0)
MCV: 90.8 fL (ref 78.0–100.0)
MONO ABS: 0.4 10*3/uL (ref 0.1–1.0)
MONOS PCT: 4 %
Neutro Abs: 8.5 10*3/uL — ABNORMAL HIGH (ref 1.7–7.7)
Neutrophils Relative %: 78 %
PLATELETS: 393 10*3/uL (ref 150–400)
RBC: 3.91 MIL/uL (ref 3.87–5.11)
RDW: 13.9 % (ref 11.5–15.5)
WBC: 10.8 10*3/uL — ABNORMAL HIGH (ref 4.0–10.5)

## 2016-04-21 LAB — COMPREHENSIVE METABOLIC PANEL
ALT: 10 U/L — AB (ref 14–54)
AST: 28 U/L (ref 15–41)
Albumin: 2.2 g/dL — ABNORMAL LOW (ref 3.5–5.0)
Alkaline Phosphatase: 77 U/L (ref 38–126)
Anion gap: 9 (ref 5–15)
BUN: 5 mg/dL — ABNORMAL LOW (ref 6–20)
CALCIUM: 8.1 mg/dL — AB (ref 8.9–10.3)
CHLORIDE: 99 mmol/L — AB (ref 101–111)
CO2: 26 mmol/L (ref 22–32)
CREATININE: 0.8 mg/dL (ref 0.44–1.00)
Glucose, Bld: 91 mg/dL (ref 65–99)
Potassium: 3.9 mmol/L (ref 3.5–5.1)
Sodium: 134 mmol/L — ABNORMAL LOW (ref 135–145)
Total Bilirubin: 0.5 mg/dL (ref 0.3–1.2)
Total Protein: 5.8 g/dL — ABNORMAL LOW (ref 6.5–8.1)

## 2016-04-21 LAB — GC/CHLAMYDIA PROBE AMP (~~LOC~~) NOT AT ARMC
CHLAMYDIA, DNA PROBE: NEGATIVE
Neisseria Gonorrhea: NEGATIVE

## 2016-04-21 LAB — PROTIME-INR
INR: 1.35 (ref 0.00–1.49)
Prothrombin Time: 16.8 seconds — ABNORMAL HIGH (ref 11.6–15.2)

## 2016-04-21 MED ORDER — IOPAMIDOL (ISOVUE-300) INJECTION 61%
100.0000 mL | Freq: Once | INTRAVENOUS | Status: AC | PRN
  Administered 2016-04-21: 100 mL via INTRAVENOUS

## 2016-04-21 MED ORDER — HYDROMORPHONE HCL 1 MG/ML IJ SOLN
1.0000 mg | Freq: Once | INTRAMUSCULAR | Status: AC
  Administered 2016-04-21: 1 mg via INTRAVENOUS
  Filled 2016-04-21: qty 1

## 2016-04-21 MED ORDER — MIDAZOLAM HCL 2 MG/2ML IJ SOLN
INTRAMUSCULAR | Status: AC | PRN
  Administered 2016-04-21 (×4): 1 mg via INTRAVENOUS

## 2016-04-21 MED ORDER — FENTANYL CITRATE (PF) 100 MCG/2ML IJ SOLN
INTRAMUSCULAR | Status: AC | PRN
  Administered 2016-04-21 (×2): 50 ug via INTRAVENOUS

## 2016-04-21 NOTE — Progress Notes (Signed)
[facility] Progress Note     Subjective: Patient just arrived back to her room via [facility] after IR placed a 53F pelvic abscess drain catheter without complication. They aspirated 7 mL of sanguinous fluid for GS and culture.   Patient states that her pain is an 8/10 and has not been improving since she was admitted. Reports nausea and vomiting with PO intake. Denies BM or flatus today.  Objective: Vital signs in last 24 hours: Temp:  [98.2 F (36.8 C)-98.8 F (37.1 C)] 98.2 F (36.8 C) (07/10 1116) Pulse Rate:  [65-83] 66 (07/10 1133) Resp:  [14-33] 18 (07/10 1133) BP: (103-121)/(63-86) 117/86 mmHg (07/10 1133) SpO2:  [99 %-100 %] 100 % (07/10 1133) Last BM Date: 04/20/16  Intake/Output from previous day: 07/09 0701 - 07/10 0700 In: 1190 [P.O.:240; I.V.:200; IV Piggyback:750] Out: 2900 [Urine:2900] Intake/Output this shift: Total I/O In: -  Out: 1100 [Urine:1100]  PE: Gen:  Sleepy from anesthesia, NAD, cooperative Card:  RRR, no M/G/R heard Pulm:  CTA, no W/R/R Abd: Soft, tender to light palpation of LLQ, hypoactive BS, no HSM, drain in RLQ with sanguinous output, abdominal striae present.   Lab Results:   Recent Labs  04/20/16 0633 04/21/16 0511  WBC 11.4* 10.8*  HGB 11.2* 12.0  HCT 33.1* 35.5*  PLT 387 393   BMET  Recent Labs  04/20/16 0633 04/21/16 0511  NA 137 134*  K 3.1* 3.9  CL 101 99*  CO2 27 26  GLUCOSE 100* 91  BUN 6 5*  CREATININE 0.75 0.80  CALCIUM 8.1* 8.1*   PT/INR  Recent Labs  04/21/16 0839  LABPROT 16.8*  INR 1.35   CMP     Component Value Date/Time   NA 134* 04/21/2016 0511   K 3.9 04/21/2016 0511   CL 99* 04/21/2016 0511   CO2 26 04/21/2016 0511   GLUCOSE 91 04/21/2016 0511   BUN 5* 04/21/2016 0511   CREATININE 0.80 04/21/2016 0511   CALCIUM 8.1* 04/21/2016 0511   PROT 5.8* 04/21/2016 0511   ALBUMIN 2.2* 04/21/2016 0511   AST 28 04/21/2016 0511   ALT 10* 04/21/2016 0511   ALKPHOS 77 04/21/2016 0511    BILITOT 0.5 04/21/2016 0511   GFRNONAA >60 04/21/2016 0511   GFRAA >60 04/21/2016 0511   Lipase     Component Value Date/Time   LIPASE 32 04/10/2016 1126    Studies/Results: Ct Abdomen Pelvis W Contrast  04/20/2016  CLINICAL DATA:  Acute onset of pelvic pain.  Initial encounter. EXAM: CT ABDOMEN AND PELVIS WITH CONTRAST TECHNIQUE: Multidetector CT imaging of the abdomen and pelvis was performed using the standard protocol following bolus administration of intravenous contrast. CONTRAST:  ISOVUE-300 IOPAMIDOL (ISOVUE-300) INJECTION 61% COMPARISON:  Pelvic ultrasound, and CT of the abdomen and pelvis performed 04/10/2016 FINDINGS: The visualized lung bases are clear. The liver and spleen are unremarkable in appearance. The gallbladder is within normal limits. The pancreas and adrenal glands are unremarkable. The kidneys are unremarkable in appearance. There is no evidence of hydronephrosis. No renal or ureteral stones are seen. No perinephric stranding is appreciated. The small bowel is unremarkable in appearance. The stomach is within normal limits. No acute vascular abnormalities are seen. There is a peripherally enhancing diffusely thick-walled abscess noted superior to the uterus, measuring approximately 6.1 x 3.1 x 1.9 cm, which appears contiguous with a complex left-sided tubo-ovarian abscess, which measures approximately 4.9 x 3.5 x 3.2 cm. Scattered air is seen tracking about the pelvic abscess and  tubo-ovarian abscess, and a prominent 4.7 cm pocket of air is noted superior to the tubo-ovarian abscess. Diffuse surrounding soft tissue inflammation is noted. A fat plane is minimally preserved between the tubo-ovarian abscess and proximal sigmoid colon, but the pelvic abscess directly abuts the mid sigmoid colon, and there is question of a tract between the mid sigmoid colon and pelvic abscess on coronal images. The amount of air within the abscesses is significantly more than expected for a  tubo-ovarian abscess, and this raises concern for focal colonic perforation. This could have arisen either from focal sigmoid diverticulitis formation, or possibly erosion at the sigmoid colon from the tubo-ovarian abscess, extending into the pelvis. The appendix is normal in caliber. There is no evidence of appendicitis. The remainder of the colon is grossly unremarkable in appearance. The sigmoid colon is mildly redundant. The bladder is mildly distended and grossly unremarkable in appearance. The uterus is grossly unremarkable, though an abscess is seen above the uterus. The right ovary is grossly unremarkable, aside from mild inflammation extending about the pelvis. No inguinal lymphadenopathy is seen. No acute osseous abnormalities are identified. IMPRESSION: 1. Diffusely thick-walled peripherally enhancing abscess noted superior to the uterus, measuring 6.1 x 3.1 x 1.9 cm. This appears contiguous with a complex left-sided tubo-ovarian abscess, which measures approximately 4.9 x 3.5 x 3.2 cm. Scattered air tracks about the pelvic abscess and tubo-ovarian abscess, with a prominent 4.7 cm pocket of air noted superior to the tubo-ovarian abscess. Diffuse surrounding soft tissue inflammation noted. 2. The pelvic abscess directly abuts the mid sigmoid colon, and there is question of a tract between the mid sigmoid colon and pelvic abscess on coronal images. The amount of air within the abscesses is significantly more than expected for a tubo-ovarian abscess origin, and this raises concern for focal colonic perforation. This could have arisen either from focal sigmoid diverticulitis with abscess formation, or possibly erosion at the sigmoid colon from the tubo-ovarian abscess extending into the pelvis. 3. If it is deemed clinically necessary to confirm the presence of a tract between the mid sigmoid colon and pelvic abscess, repeat pelvic CT with rectal contrast could be considered for further evaluation. Critical  Value/emergent results were called by telephone at the time of interpretation on 04/20/2016 at 8:03 pm to Dr. Noland FordyceKELLY FOGLEMAN, who verbally acknowledged these results. Electronically Signed   By: Roanna RaiderJeffery  Chang M.D.   On: 04/20/2016 20:17    Anti-infectives: Anti-infectives    Start     Dose/Rate Route Frequency Ordered Stop   04/19/16 0200  clindamycin (CLEOCIN) IVPB 900 mg     900 mg 100 mL/hr over 30 Minutes Intravenous 2 times per day 04/18/16 1607     04/18/16 1800  gentamicin (GARAMYCIN) 300 mg, clindamycin (CLEOCIN) 900 mg in dextrose 5 % 100 mL IVPB     227 mL/hr over 30 Minutes Intravenous Every 24 hours 04/18/16 1637     04/18/16 1700  metroNIDAZOLE (FLAGYL) IVPB 500 mg     500 mg 100 mL/hr over 60 Minutes Intravenous Every 6 hours 04/18/16 1607     04/18/16 1615  gentamicin (GARAMYCIN) 5 mg/kg in dextrose 5 % 100 mL IVPB  Status:  Discontinued     5 mg/kg (Adjusted) 100 mL/hr over 60 Minutes Intravenous Every 24 hours 04/18/16 1607 04/18/16 1635       Assessment/Plan Tubo-ovarian abscess, left Pelvic pain - 29F pelvic drain placed by IR 04/21/16 - < 5 cc sanguinous fluid in bag. - Clindamycin day #3,  Gentamicin day #4, Metronidazole day #4 - WBC trending down (10.8)  - follow cultures   Plan: drain in place and patient responding to IV abx. WBC trending down. CT reviewed and it does not appear that the abscess is communicating with the colon. No further surgical intervention is recommended at this time. We will follow along this patients care.   LOS: 3 days    Adam Phenix , [facility] 04/21/2016, 12:21 PM Pager: [phone] Consults: [phone] Mon-Fri 7:00 am-4:30 pm Sat-Sun 7:00 am-11:30 am

## 2016-04-21 NOTE — Procedures (Signed)
CT  placement 24F pelvic abscess drain catheter 7ml bloody fluid aspirated sent for GS, C&S No complication No blood loss. [facility].

## 2016-04-21 NOTE — Consult Note (Signed)
Chief Complaint: Patient was seen in consultation today for CT guided drainage of pelvic abscess  Referring Physician(s): Darrold Span  Supervising Physician: Oley Balm  Patient Status: Inpatient  History of Present Illness: Sheila Willis is a 30 y.o. female with past medical history of depression as well as PCOS . She has had history of pelvic pain/nausea which initially began on 04/10/16 with CT at that time showing a localized fluid collection immediately adjacent to the left ovary and a small amount of fluid noted tracking in the cul-de-sac probably representing collapsed left ovarian cyst with hemorrhage. She was treated conservatively for possible PID at the time with IM Rocephin. She was also given a prescription for doxycycline and Flagyl but never started the medications. She now presents with persistent pelvic pain as well as some vaginal discharge. She recently had her IUD removed. Follow-up CT 7/9 revealed a diffusely thick-walled enhancing abscess superior to the uterus and  likely contiguous with a complex left-sided tubo-ovarian abscess. Pelvic abscess directly abuts the mid sigmoid colon and question was raised of a tract between the mid sigmoid colon and the pelvic abscess. Request now received from surgery and GYN for CT-guided drainage of the above-mentioned pelvic abscess.  Past Medical History  Diagnosis Date  . Gestational diabetes   . Depression   . PCOS (polycystic ovarian syndrome)   . ZOXWRUEA(540.9)     Past Surgical History  Procedure Laterality Date  . No past surgeries      Allergies: Latex  Medications: Prior to Admission medications   Medication Sig Start Date End Date Taking? Authorizing Provider  ondansetron (ZOFRAN ODT) 4 MG disintegrating tablet Take 1 tablet (4 mg total) by mouth every 8 (eight) hours as needed for nausea or vomiting. 04/10/16  Yes Barrett Henle, PA-C  oxyCODONE-acetaminophen (PERCOCET/ROXICET) 5-325 MG  tablet Take 1 tablet by mouth every 2 (two) hours as needed for severe pain.   Yes Historical Provider, MD  oxyCODONE (ROXICODONE) 5 MG immediate release tablet Take 1 tablet (5 mg total) by mouth every 4 (four) hours as needed for severe pain. Patient not taking: Reported on 04/18/2016 04/10/16   Barrett Henle, PA-C     Family History  Problem Relation Age of Onset  . Cancer Other   . Hypertension Other   . Stroke Other   . Heart attack Other   . Heart disease Mother   . Hypertension Mother   . Cancer Mother     ovarian    Social History   Social History  . Marital Status: Single    Spouse Name: N/A  . Number of Children: N/A  . Years of Education: N/A   Social History Main Topics  . Smoking status: Former Smoker -- 0.50 packs/day    Types: Cigarettes  . Smokeless tobacco: None  . Alcohol Use: No  . Drug Use: No  . Sexual Activity: Yes    Birth Control/ Protection: IUD   Other Topics Concern  . None   Social History Narrative     Review of Systems see above; has had recent nausea and vomiting. currently denies fever, headache, chest pain, cough, dyspnea, back pain, hematuria/dysuria  Vital Signs: BP 109/78 mmHg  Pulse 83  Temp(Src) 98.7 F (37.1 C) (Oral)  Resp 16  Ht  (1.575 m)  Wt 134 lb (60.782 kg)  BMI 24.50 kg/m2  SpO2 99%  Physical Exam patient awake but slightly drowsy. Answers questions appropriately. Chest clear to auscultation bilaterally.  Heart with regular rate and rhythm. Abdomen soft, positive bowel sounds, mild to moderate pelvic tenderness, greater in left lower quadrant; lower extremities with no edema  Mallampati Score:     Imaging: US Transvaginal Non-ob  04/10/2016  CLINICAL DATA:  30 year old female with acute left pelvic pain. Patient with IUD. EXAM: TRANSABDOMINAL AND TRANSVAGINAL ULTRASOUND OF PELVIS DOPPLER ULTRASOUND OF OVARIES TECHNIQUE: Both transabdominal and transvaginal ultrasound examinations of the pelvis were  performed. Transabdominal technique was performed for global imaging of the pelvis including uterus, ovaries, adnexal regions, and pelvic cul-de-sac. It was necessary to proceed with endovaginal exam following the transabdominal exam to visualize the ovaries and endometrium. Color and duplex Doppler ultrasound was utilized to evaluate blood flow to the ovaries. COMPARISON:  04/10/2016 CT FINDINGS: Uterus Measurements: 7.9 x 4.1 x 6.4 cm. No fibroids or other mass visualized. Endometrium Thickness: 4.4 mm. No focal abnormality visualized. An IUD is identified without definite abnormality. Right ovary Measurements: 4.4 x 2.1 x 2.4 cm. Normal appearance/no adnexal mass. Left ovary Measurements: 4.2 x 2.8 x 3.7 cm. A 2.2 x 1.8 x 2.1 cm complex cyst/follicle in the left ovary noted. Pulsed Doppler evaluation of both ovaries demonstrates normal low-resistance arterial and venous waveforms. Other findings A small amount of free pelvic fluid is noted. IMPRESSION: No evidence of ovarian torsion. 2.2 cm complex cyst/follicle within the left ovary. Small amount of free pelvic fluid. IUD. Electronically Signed   By: Harmon Pier M.D.   On: 04/10/2016 15:37   US Pelvis Complete  04/10/2016  CLINICAL DATA:  30 year old female with acute left pelvic pain. Patient with IUD. EXAM: TRANSABDOMINAL AND TRANSVAGINAL ULTRASOUND OF PELVIS DOPPLER ULTRASOUND OF OVARIES TECHNIQUE: Both transabdominal and transvaginal ultrasound examinations of the pelvis were performed. Transabdominal technique was performed for global imaging of the pelvis including uterus, ovaries, adnexal regions, and pelvic cul-de-sac. It was necessary to proceed with endovaginal exam following the transabdominal exam to visualize the ovaries and endometrium. Color and duplex Doppler ultrasound was utilized to evaluate blood flow to the ovaries. COMPARISON:  04/10/2016 CT FINDINGS: Uterus Measurements: 7.9 x 4.1 x 6.4 cm. No fibroids or other mass visualized.  Endometrium Thickness: 4.4 mm. No focal abnormality visualized. An IUD is identified without definite abnormality. Right ovary Measurements: 4.4 x 2.1 x 2.4 cm. Normal appearance/no adnexal mass. Left ovary Measurements: 4.2 x 2.8 x 3.7 cm. A 2.2 x 1.8 x 2.1 cm complex cyst/follicle in the left ovary noted. Pulsed Doppler evaluation of both ovaries demonstrates normal low-resistance arterial and venous waveforms. Other findings A small amount of free pelvic fluid is noted. IMPRESSION: No evidence of ovarian torsion. 2.2 cm complex cyst/follicle within the left ovary. Small amount of free pelvic fluid. IUD. Electronically Signed   By: Harmon Pier M.D.   On: 04/10/2016 15:37   Ct Abdomen Pelvis W Contrast  04/20/2016  CLINICAL DATA:  Acute onset of pelvic pain.  Initial encounter. EXAM: CT ABDOMEN AND PELVIS WITH CONTRAST TECHNIQUE: Multidetector CT imaging of the abdomen and pelvis was performed using the standard protocol following bolus administration of intravenous contrast. CONTRAST:  ISOVUE-300 IOPAMIDOL (ISOVUE-300) INJECTION 61% COMPARISON:  Pelvic ultrasound, and CT of the abdomen and pelvis performed 04/10/2016 FINDINGS: The visualized lung bases are clear. The liver and spleen are unremarkable in appearance. The gallbladder is within normal limits. The pancreas and adrenal glands are unremarkable. The kidneys are unremarkable in appearance. There is no evidence of hydronephrosis. No renal or ureteral stones are seen. No perinephric stranding  is appreciated. The small bowel is unremarkable in appearance. The stomach is within normal limits. No acute vascular abnormalities are seen. There is a peripherally enhancing diffusely thick-walled abscess noted superior to the uterus, measuring approximately 6.1 x 3.1 x 1.9 cm, which appears contiguous with a complex left-sided tubo-ovarian abscess, which measures approximately 4.9 x 3.5 x 3.2 cm. Scattered air is seen tracking about the pelvic abscess and  tubo-ovarian abscess, and a prominent 4.7 cm pocket of air is noted superior to the tubo-ovarian abscess. Diffuse surrounding soft tissue inflammation is noted. A fat plane is minimally preserved between the tubo-ovarian abscess and proximal sigmoid colon, but the pelvic abscess directly abuts the mid sigmoid colon, and there is question of a tract between the mid sigmoid colon and pelvic abscess on coronal images. The amount of air within the abscesses is significantly more than expected for a tubo-ovarian abscess, and this raises concern for focal colonic perforation. This could have arisen either from focal sigmoid diverticulitis formation, or possibly erosion at the sigmoid colon from the tubo-ovarian abscess, extending into the pelvis. The appendix is normal in caliber. There is no evidence of appendicitis. The remainder of the colon is grossly unremarkable in appearance. The sigmoid colon is mildly redundant. The bladder is mildly distended and grossly unremarkable in appearance. The uterus is grossly unremarkable, though an abscess is seen above the uterus. The right ovary is grossly unremarkable, aside from mild inflammation extending about the pelvis. No inguinal lymphadenopathy is seen. No acute osseous abnormalities are identified. IMPRESSION: 1. Diffusely thick-walled peripherally enhancing abscess noted superior to the uterus, measuring 6.1 x 3.1 x 1.9 cm. This appears contiguous with a complex left-sided tubo-ovarian abscess, which measures approximately 4.9 x 3.5 x 3.2 cm. Scattered air tracks about the pelvic abscess and tubo-ovarian abscess, with a prominent 4.7 cm pocket of air noted superior to the tubo-ovarian abscess. Diffuse surrounding soft tissue inflammation noted. 2. The pelvic abscess directly abuts the mid sigmoid colon, and there is question of a tract between the mid sigmoid colon and pelvic abscess on coronal images. The amount of air within the abscesses is significantly more than  expected for a tubo-ovarian abscess origin, and this raises concern for focal colonic perforation. This could have arisen either from focal sigmoid diverticulitis with abscess formation, or possibly erosion at the sigmoid colon from the tubo-ovarian abscess extending into the pelvis. 3. If it is deemed clinically necessary to confirm the presence of a tract between the mid sigmoid colon and pelvic abscess, repeat pelvic CT with rectal contrast could be considered for further evaluation. Critical Value/emergent results were called by telephone at the time of interpretation on 04/20/2016 at 8:03 pm to Dr. Noland FordyceKELLY FOGLEMAN, who verbally acknowledged these results. Electronically Signed   By: Roanna RaiderJeffery  Chang M.D.   On: 04/20/2016 20:17   Ct Abdomen Pelvis W Contrast  04/10/2016  CLINICAL DATA:  One day history of left lower quadrant pain EXAM: CT ABDOMEN AND PELVIS WITH CONTRAST TECHNIQUE: Multidetector CT imaging of the abdomen and pelvis was performed using the standard protocol following bolus administration of intravenous contrast. CONTRAST:  100mL ISOVUE-300 IOPAMIDOL (ISOVUE-300) INJECTION 61% COMPARISON:  None. FINDINGS: Lower chest:  Lung bases are clear. Hepatobiliary: No focal liver lesions are appreciable. Gallbladder wall is not appreciably thickened. There is no biliary duct dilatation. Pancreas: There is no pancreatic mass or inflammatory focus. Spleen: No splenic lesions are evident. Adrenals/Urinary Tract: Adrenals appear normal bilaterally. Kidneys bilaterally show no mass or hydronephrosis on  either side. There is no renal or ureteral calculus on either side. Urinary bladder is midline with wall thickness within normal limits. Stomach/Bowel: There is no bowel wall or mesenteric thickening. No bowel obstruction. No free air or portal venous air. Vascular/Lymphatic: There is no abdominal aortic aneurysm. No vascular lesions are evident. There is no appreciable adenopathy in the abdomen or pelvis.  Reproductive: There is an intrauterine device within the uterus. The uterus is anteverted. Uterus otherwise appears unremarkable. Right ovary appears normal. There is fluid immediately surrounding the left ovary. A small amount of fluid also tracks into the cul-de-sac region. There is a probable partially collapsed cyst in the left ovary measuring 1.8 x 1.6 cm. No other pelvic mass evident. Other: Appendix appears unremarkable. There is no demonstrable abscess in the abdomen or pelvis. Musculoskeletal: There are no blastic or lytic bone lesions. There is no intramuscular or abdominal wall lesion. IMPRESSION: Localized fluid immediately adjacent to the left ovary with a small amount of fluid also noted tracking in the cul-de-sac. Probable collapsing cyst in left ovary. Suspect recent ovarian cyst rupture, possibly with hemorrhage. Note that the attenuation of the fluid in the region of the left ovary is slightly greater than is expected with serous fluid. Fluid in the cul-de-sac does have attenuation consistent with serous fluid. It should be noted that intermittent ovarian torsion on the left could present in this manner; it may be prudent to correlate with pelvic ultrasound including Doppler evaluation. Intrauterine device also noted in the uterus. No bowel obstruction. No abscess. Appendix region appears normal. No renal or ureteral calculus. No hydronephrosis. Electronically Signed   By: Bretta Bang III M.D.   On: 04/10/2016 13:56   [place] Art/ven Flow Abd Pelv Doppler  04/10/2016  CLINICAL DATA:  30 year old female with acute left pelvic pain. Patient with IUD. EXAM: TRANSABDOMINAL AND TRANSVAGINAL ULTRASOUND OF PELVIS DOPPLER ULTRASOUND OF OVARIES TECHNIQUE: Both transabdominal and transvaginal ultrasound examinations of the pelvis were performed. Transabdominal technique was performed for global imaging of the pelvis including uterus, ovaries, adnexal regions, and pelvic cul-de-sac. It was necessary to  proceed with endovaginal exam following the transabdominal exam to visualize the ovaries and endometrium. Color and duplex Doppler ultrasound was utilized to evaluate blood flow to the ovaries. COMPARISON:  04/10/2016 CT FINDINGS: Uterus Measurements: 7.9 x 4.1 x 6.4 cm. No fibroids or other mass visualized. Endometrium Thickness: 4.4 mm. No focal abnormality visualized. An IUD is identified without definite abnormality. Right ovary Measurements: 4.4 x 2.1 x 2.4 cm. Normal appearance/no adnexal mass. Left ovary Measurements: 4.2 x 2.8 x 3.7 cm. A 2.2 x 1.8 x 2.1 cm complex cyst/follicle in the left ovary noted. Pulsed Doppler evaluation of both ovaries demonstrates normal low-resistance arterial and venous waveforms. Other findings A small amount of free pelvic fluid is noted. IMPRESSION: No evidence of ovarian torsion. 2.2 cm complex cyst/follicle within the left ovary. Small amount of free pelvic fluid. IUD. Electronically Signed   By: Harmon Pier M.D.   On: 04/10/2016 15:37    Labs:  CBC:  Recent Labs  04/18/16 1619 04/19/16 0624 04/20/16 0633 04/21/16 0511  WBC 21.5* 17.6* 11.4* 10.8*  HGB 12.9 11.7* 11.2* 12.0  HCT 37.4 34.0* 33.1* 35.5*  PLT 401* 377 387 393    COAGS: No results for input(s): INR, APTT in the last 8760 hours.  BMP:  Recent Labs  04/18/16 1619 04/19/16 0624 04/20/16 0633 04/21/16 0511  NA 135 135 137 134*  K 2.5* 3.0* 3.1*  3.9  CL 96* 99* 101 99*  CO2 24 25 27 26   GLUCOSE 86 99 100* 91  BUN 6 7 6  5*  CALCIUM 8.6* 7.9* 8.1* 8.1*  CREATININE 0.80 0.81 0.75 0.80  GFRNONAA >60 >60 >60 >60  GFRAA >60 >60 >60 >60    LIVER FUNCTION TESTS:  Recent Labs  04/10/16 1126 04/19/16 0624 04/20/16 0633 04/21/16 0511  BILITOT 0.6 0.7 0.7 0.5  AST 14* 11* 14* 28  ALT 7* 8* 7* 10*  ALKPHOS 99 89 77 77  PROT 7.7 5.9* 6.2* 5.8*  ALBUMIN 4.1 2.3* 2.2* 2.2*    TUMOR MARKERS: No results for input(s): AFPTM, CEA, CA199, CHROMGRNA in the last 8760  hours.  Assessment and Plan: Patient with history of persistent pelvic pain, recent IUD removal, vaginal discharge and CT findings suggestive of pelvic/tubo-ovarian abscess. Request now received for CT guided drainage of the abscess. Imaging studies have been reviewed by Dr. Deanne Coffer. Details/risks of procedure, including but not limited to, internal bleeding, infection, injury to adjacent organs, inability to drain abscess, discussed with patient with her understanding and consent. Procedure scheduled for this morning.   Thank you for this interesting consult.  I greatly enjoyed meeting Sheila Willis and look forward to participating in their care.  A copy of this report was sent to the requesting provider on this date.  Electronically Signed: D. Jeananne Rama 04/21/2016, 9:02 AM      I spent a total of 25 minutes  in face to face in clinical consultation, greater than 50% of which was counseling/coordinating care for CT-guided pelvic abscess drainage

## 2016-04-21 NOTE — Sedation Documentation (Signed)
Asleep.

## 2016-04-21 NOTE — Progress Notes (Signed)
Late entry, pt seen at 1p  HD#4 TOA POD#0 s/p CT guided percutaneous drain placement  HPI: Pt sleepy and nauseated since CT guided drain placement. Pt notes pain slightly improved but still significant pain. No fevers. Pain responds to IV pain meds.  BM yesterday.  PE: Filed Vitals:   04/21/16 1620 04/21/16 1735 04/21/16 1843 04/21/16 2010  BP: 107/68 108/71 106/71 107/72  Pulse: 67 67 73 85  Temp: 98 F (36.7 C) 98.3 F (36.8 C) 98.6 F (37 C) 98.3 F (36.8 C)  TempSrc: Oral Oral Oral Oral  Resp: 19 16 16 17   Height:      Weight:      SpO2: 96% 99% 98% 96%   Gen: sleepy but arouses. Dry heaving at the end of visit.  Abd: soft, no distension, no rebound, no guarding, pain with palpation in BLQ, L>R. Percutaneous drain in place, minimal serosanguinous output.   GU: def today LE: no edema, NT, SCDs in place  UCx negative GC/CT neg/ HIV neg  CBC Latest Ref Rng 04/21/2016 04/20/2016 04/19/2016  WBC 4.0 - 10.5 K/uL 10.8(H) 11.4(H) 17.6(H)  Hemoglobin 12.0 - 15.0 g/dL 09.612.0 11.2(L) 11.7(L)  Hematocrit 36.0 - 46.0 % 35.5(L) 33.1(L) 34.0(L)  Platelets 150 - 400 K/uL 393 387 377   CT 7/9: Diffusely thick-walled peripherally enhancing abscess noted superior to the uterus, measuring 6.1 x 3.1 x 1.9 cm. This appears contiguous with a complex left-sided tubo-ovarian abscess, which measures approximately 4.9 x 3.5 x 3.2 cm. Scattered air tracks about the pelvic abscess and tubo-ovarian abscess, with a prominent 4.7 cm pocket of air noted superior to the tubo-ovarian abscess. Diffuse surrounding soft tissue inflammation noted. 2. The pelvic abscess directly abuts the mid sigmoid colon, and there is question of a tract between the mid sigmoid colon and pelvic abscess on coronal images. The amount of air within the abscesses is significantly more than expected for a tubo-ovarian abscess origin, and this raises concern for focal colonic perforation. This could have arisen either from focal  sigmoid diverticulitis with abscess formation, or possibly erosion at the sigmoid colon from the tubo-ovarian abscess extending into the pelvis.  A/P:Acute pelvic pain due to TOA. [name]. GC/CT/ cultures negative.  Percutaneous drain placed into abscess by VIR, small amount of drainage. Cultures pending, Gram positive rods seen- Actinomyces most common with TOA in setting of PID/ IUD use but await cultures. Will consider ID consult. Clinically pt is improving with slight improvement in pain and resolution of leukocytosis. Suspicion for sigmoid perforation on CT 04/20/16 and appreciate input from general surgery. They are recommending non-surgical approach at this time due to high risks of operative intervention in the setting of acute infection in a stable patient. This aligns with recommended care for a TOA- treat with antibiotics, drain if possible, consider surgery if pt worsening or  With persistent pain once infection and inflammation resolved. For TOA would plan at least 2 wks antibiotics but once pain controlled and pt able to tolerate po, she can be followed as an outpt. Currently, nausea and pain are requiring IV control and pt continues to meet inpt criteria. Continue on Gent, doxy, Flagyl  - Pain, continue 1mg  Dilaudid IV, q 2 hrs for pain control. Will transition to oral meds when pt able.   - Nausea and emesis, will cont IV meds over oral meds.   - Low Potassium, due to emesis, improvement after 11 runs potassium, K added to maintenance fluids, Check again tomorrow.   -  Constipation, add Miralax.  - dispo, needs continued inpt admission for abscess.  Sheila Willis A. 04/21/2016 10:20 PM

## 2016-04-21 NOTE — Progress Notes (Signed)
Called Glory RosebushBriana Williams, RN at [facility] to update patient 8/10 pain and nausea with zofran and dilaudid IV given prior to [facility]

## 2016-04-21 NOTE — Progress Notes (Signed)
[facility] arrived [facility]  Patient c/o 8/10 abdominal pain and nausea.  Paged Jeananne RamaKevin Allred, PA who gave verbal order to given zofran 4mg  and dilaudid 1mg  IV which was previously ordered.

## 2016-04-22 DIAGNOSIS — B9689 Other specified bacterial agents as the cause of diseases classified elsewhere: Secondary | ICD-10-CM

## 2016-04-22 DIAGNOSIS — N7003 Acute salpingitis and oophoritis: Secondary | ICD-10-CM

## 2016-04-22 LAB — COMPREHENSIVE METABOLIC PANEL
ALBUMIN: 2.2 g/dL — AB (ref 3.5–5.0)
ALT: 10 U/L — AB (ref 14–54)
AST: 25 U/L (ref 15–41)
Alkaline Phosphatase: 66 U/L (ref 38–126)
Anion gap: 9 (ref 5–15)
CHLORIDE: 102 mmol/L (ref 101–111)
CO2: 25 mmol/L (ref 22–32)
CREATININE: 0.71 mg/dL (ref 0.44–1.00)
Calcium: 8.1 mg/dL — ABNORMAL LOW (ref 8.9–10.3)
GFR calc Af Amer: >60 mL/min (ref >60)
Glucose, Bld: 94 mg/dL (ref 65–99)
POTASSIUM: 4.2 mmol/L (ref 3.5–5.1)
SODIUM: 136 mmol/L (ref 135–145)
Total Bilirubin: 0.6 mg/dL (ref 0.3–1.2)
Total Protein: 5.4 g/dL — ABNORMAL LOW (ref 6.5–8.1)

## 2016-04-22 LAB — CBC WITH DIFFERENTIAL/PLATELET
BASOS ABS: 0 10*3/uL (ref 0.0–0.1)
BASOS PCT: 0 %
EOS ABS: 0 10*3/uL (ref 0.0–0.7)
Eosinophils Relative: 0 %
HCT: 35.5 % — ABNORMAL LOW (ref 36.0–46.0)
Hemoglobin: 12 g/dL (ref 12.0–15.0)
LYMPHS PCT: 13 %
Lymphs Abs: 1.6 10*3/uL (ref 0.7–4.0)
MCH: 30.7 pg (ref 26.0–34.0)
MCHC: 33.8 g/dL (ref 30.0–36.0)
MCV: 90.8 fL (ref 78.0–100.0)
MONO ABS: 0.6 10*3/uL (ref 0.1–1.0)
Monocytes Relative: 5 %
Neutro Abs: 10.1 10*3/uL — ABNORMAL HIGH (ref 1.7–7.7)
Neutrophils Relative %: 82 %
Platelets: 396 10*3/uL (ref 150–400)
RBC: 3.91 MIL/uL (ref 3.87–5.11)
RDW: 14 % (ref 11.5–15.5)
WBC: 12.4 10*3/uL — AB (ref 4.0–10.5)

## 2016-04-22 MED ORDER — BOOST / RESOURCE BREEZE PO LIQD
1.0000 | Freq: Three times a day (TID) | ORAL | Status: DC
  Administered 2016-04-22 – 2016-04-28 (×10): 1 via ORAL
  Filled 2016-04-22 (×8): qty 1

## 2016-04-22 MED ORDER — SODIUM CHLORIDE 0.9 % IV SOLN
1.0000 g | INTRAVENOUS | Status: DC
  Administered 2016-04-22 – 2016-04-27 (×6): 1 g via INTRAVENOUS
  Filled 2016-04-22 (×8): qty 1

## 2016-04-22 MED ORDER — ERTAPENEM SODIUM 1 G IJ SOLR
1.0000 g | INTRAMUSCULAR | Status: DC
  Filled 2016-04-22: qty 1

## 2016-04-22 MED ORDER — SODIUM CHLORIDE 0.9 % IV SOLN
INTRAVENOUS | Status: DC
  Administered 2016-04-22 – 2016-04-28 (×9): via INTRAVENOUS
  Administered 2016-04-29: 100 mL/h via INTRAVENOUS
  Administered 2016-04-30 (×2): via INTRAVENOUS

## 2016-04-22 NOTE — Progress Notes (Signed)
HD#5 TOA POD#1 s/p CT guided percutaneous drain placement  HPI:. Pt notes pain slightly improved but still significant pain- rates 9/10, does respond to IV Dilaudid, getting  IV q 2 hrs. No fevers.  BM yesterday, small 7/9. No current flatus. Pt with no appetite, emesis this am, getting IV antiemetics, very little po intake since admission.  PE: Filed Vitals:   04/21/16 2010 04/21/16 2249 04/22/16 0533 04/22/16 1200  BP: 107/72 105/71 107/75 109/78  Pulse: 85 79 86 75  Temp: 98.3 F (36.8 C) 98.7 F (37.1 C) 98.4 F (36.9 C) 98.4 F (36.9 C)  TempSrc: Oral Oral Oral Oral  Resp: Height:      Weight:      SpO2: 96% 97% 100% 100%   Gen: Alert, cooperative, no acute distress Abd: soft, no distension, no rebound, no guarding, pain with palpation in BLQ, L>R, no longer having referred pain with palpation in upper abdomen, no tympany.  Percutaneous drain in place, minimal serosanguinous output, some gas in drain bag.   GU: def today LE: no edema, NT, SCDs in place  UCx negative GC/CT neg/ HIV neg Gram stain from fluid: G pos rods, Gram neg rods- prelim  CBC Latest Ref Rng 04/22/2016 04/21/2016 04/20/2016  WBC 4.0 - 10.5 K/uL 12.4(H) 10.8(H) 11.4(H)  Hemoglobin 12.0 - 15.0 g/dL 09.8 11.9 11.2(L)  Hematocrit 36.0 - 46.0 % 35.5(L) 35.5(L) 33.1(L)  Platelets 150 - 400 K/uL 396 393 387   CT 7/9: Diffusely thick-walled peripherally enhancing abscess noted superior to the uterus, measuring 6.1 x 3.1 x 1.9 cm. This appears contiguous with a complex left-sided tubo-ovarian abscess, which measures approximately 4.9 x 3.5 x 3.2 cm. Scattered air tracks about the pelvic abscess and tubo-ovarian abscess, with a prominent 4.7 cm pocket of air noted superior to the tubo-ovarian abscess. Diffuse surrounding soft tissue inflammation noted. 2. The pelvic abscess directly abuts the mid sigmoid colon, and there is question of a tract between the mid sigmoid colon and pelvic abscess on  coronal images. The amount of air within the abscesses is significantly more than expected for a tubo-ovarian abscess origin, and this raises concern for focal colonic perforation. This could have arisen either from focal sigmoid diverticulitis with abscess formation, or possibly erosion at the sigmoid colon from the tubo-ovarian abscess extending into the Pelvis.  CMP     Component Value Date/Time   NA 136 04/22/2016 0512   K 4.2 04/22/2016 0512   CL 102 04/22/2016 0512   CO2 25 04/22/2016 0512   GLUCOSE 94 04/22/2016 0512   BUN <5* 04/22/2016 0512   CREATININE 0.71 04/22/2016 0512   CALCIUM 8.1* 04/22/2016 0512   PROT 5.4* 04/22/2016 0512   ALBUMIN 2.2* 04/22/2016 0512   AST 25 04/22/2016 0512   ALT 10* 04/22/2016 0512   ALKPHOS 66 04/22/2016 0512   BILITOT 0.6 04/22/2016 0512   GFRNONAA >60 04/22/2016 0512   GFRAA >60 04/22/2016 1478     A/P:29 yo G2P2, non-preg patient with acute pelvic pain due to TOA. [name]. GC/CT/ cultures negative. Species identification pending from percutaneous drain placed into abscess by VIR on 04/21/16. Since placement, only small amount of drainage. Pt has been on IV gent, clindamycin and flagyl since admission. Will check gent level. Clinically pt is improving with slight improvement in pain and resolution of leukocytosis, though slight bump in WBC today. Suspicion for sigmoid perforation on CT 04/20/16 and appreciate input from general surgery who  do not think there is a  bowel perforation They are recommending non-surgical approach at this time due to high risks of operative intervention in the setting of acute infection in a stable patient. This aligns with recommended care for a TOA- treat with antibiotics, drain if possible, consider surgery if pt worsening or an interval surgery with persistent pain once infection and inflammation resolved. For TOA would plan at least 2 wks antibiotics but once pain controlled and pt able to tolerate  po, she can be followed as an outpt. Currently, nausea and pain are requiring IV control and pt continues to meet inpt criteria. Continue on Gent, doxy, Flagyl  - Pain, continue 1mg  Dilaudid IV, q 2 hrs for pain control. Will transition to oral meds when pt able.   - Nausea and emesis, will cont IV meds over oral meds.   - Low Potassium, due to emesis, improvement after 11 runs potassium, K added to maintenance fluids, In range today and will move back to NS.  -  Constipation, add Miralax.  - dispo, needs continued inpt admission for abscess.  - nutrition, poor po intake, will add boost.   Sheila Smigelski A. 04/22/2016 2:55 PM

## 2016-04-22 NOTE — Consult Note (Signed)
[facility]  Date of Admission:  04/18/2016  Date of Consult:  04/22/2016  Reason for Consult: Tubo-ovarian abscess Referring Physician: Valentino Saxon  Impression/Recommendation Tubo-ovarian abscess Would change her atbx to invanz Would consider repeat CT scan in next 48h  HIV -, GC/Chlamydia - on 7-7.   Comment-  Spoke to pt and partner about the course of her tx will be determined by repeat CT scan- possible new drain, possible surgery.  Her Cr has tolerated gent so far, has not gotten GI issues from clinda. Her clinda and flagyl are somewhat duplicative, will simplify her rx.    Thank you so much for this interesting consult,   Bobby Rumpf (pager) [phone] [email or web address]  Sheila Willis is an 30 y.o. female.  HPI: 30 yo F with hx of PCOS, adm to hospital on 7-7 with pelvic pain since 6-29. She had CT scan showing a collapsed ovarian cyst. She was also seen on 6-29 and treated for PUD (ceftriaxone IM x 1. Rx given for doxy/flagyl however she did not fill these). Ovarian cyst was felt to be the cause of her pain until she returned with continuing pain on 7-7 and was found on CT to have 2 abscesses as well as an air pocket. There was also concern that the abscess were involving her colon. Her WBC was 21.5. Her IUD was removed at that time.  She was started on clinda/gent/flagyl.  She was eval by surgery on 7-9 and then by IR on 7-10 and underwent drain placement at that time. She has been afebrile and her WBC has improved to 12.4.  Her g/s showed GPR and GNR. Her Cx is growing GNR.  Her drain has put out 6 cc today, 5 cc yesterday.    Past Medical History  Diagnosis Date  . Gestational diabetes   . Depression   . PCOS (polycystic ovarian syndrome)   . RFXJOITG(549.8)     Past Surgical History  Procedure Laterality Date  . No past surgeries       Allergies  Allergen Reactions  . Latex Itching    Medications:  Scheduled: .  sodium chloride   Intravenous Once  . clindamycin (CLEOCIN) IV  900 mg Intravenous 2 times per day  . famotidine (PEPCID) IV  20 mg Intravenous Q12H  . feeding supplement  1 Container Oral TID BM  . gentamicin (GARAMYCIN) with clindamycin (CLEOCIN) IV   Intravenous Q24H  . metronidazole  500 mg Intravenous Q6H  . polyethylene glycol  17 g Oral Daily    Abtx:  Anti-infectives    Start     Dose/Rate Route Frequency Ordered Stop   04/19/16 0200  clindamycin (CLEOCIN) IVPB 900 mg     900 mg 100 mL/hr over 30 Minutes Intravenous 2 times per day 04/18/16 1607     04/18/16 1800  gentamicin (GARAMYCIN) 300 mg, clindamycin (CLEOCIN) 900 mg in dextrose 5 % 100 mL IVPB     227 mL/hr over 30 Minutes Intravenous Every 24 hours 04/18/16 1637     04/18/16 1700  metroNIDAZOLE (FLAGYL) IVPB 500 mg     500 mg 100 mL/hr over 60 Minutes Intravenous Every 6 hours 04/18/16 1607     04/18/16 1615  gentamicin (GARAMYCIN) 5 mg/kg in dextrose 5 % 100 mL IVPB  Status:  Discontinued     5 mg/kg (Adjusted) 100 mL/hr over 60 Minutes Intravenous Every 24 hours 04/18/16 1607 04/18/16 1635      Total days of  antibiotics: 4 clinda/gent/flagyl          Social History:  reports that she has quit smoking. Her smoking use included Cigarettes. She smoked 0.50 packs per day. She does not have any smokeless tobacco history on file. She reports that she does not drink alcohol or use illicit drugs.  Family History  Problem Relation Age of Onset  . Cancer Other   . Hypertension Other   . Stroke Other   . Heart attack Other   . Heart disease Mother   . Hypertension Mother   . Cancer Mother     ovarian    General ROS: no BM in 2 days. eating well today. normal urine. continued abd pain. denies fever. see HPI.   Blood pressure 109/78, pulse 75, temperature 98.4 F (36.9 C), temperature source Oral, resp. rate 18, height 5' 2"  (1.575 m), weight 60.782 kg (134 lb), SpO2 100 %. General appearance: alert, cooperative  and no distress Eyes: negative findings: conjunctivae and sclerae normal and pupils equal, round, reactive to light and accomodation Throat: lips, mucosa, and tongue normal; teeth and gums normal Neck: no adenopathy and supple, symmetrical, trachea midline Lungs: clear to auscultation bilaterally Heart: regular rate and rhythm Abdomen: normal findings: bowel sounds normal and abnormal findings:  drain in RLQ. tender with gaurding.  Extremities: edema none   Results for orders placed or performed during the hospital encounter of 04/18/16 (from the past 48 hour(s))  CBC with Differential/Platelet     Status: Abnormal   Collection Time: 04/21/16  5:11 AM  Result Value Ref Range   WBC 10.8 (H) 4.0 - 10.5 K/uL   RBC 3.91 3.87 - 5.11 MIL/uL   Hemoglobin 12.0 12.0 - 15.0 g/dL   HCT 35.5 (L) 36.0 - 46.0 %   MCV 90.8 78.0 - 100.0 fL   MCH 30.7 26.0 - 34.0 pg   MCHC 33.8 30.0 - 36.0 g/dL   RDW 13.9 11.5 - 15.5 %   Platelets 393 150 - 400 K/uL   Neutrophils Relative % 78 %   Neutro Abs 8.5 (H) 1.7 - 7.7 K/uL   Lymphocytes Relative 17 %   Lymphs Abs 1.9 0.7 - 4.0 K/uL   Monocytes Relative 4 %   Monocytes Absolute 0.4 0.1 - 1.0 K/uL   Eosinophils Relative 1 %   Eosinophils Absolute 0.1 0.0 - 0.7 K/uL   Basophils Relative 0 %   Basophils Absolute 0.0 0.0 - 0.1 K/uL  Comprehensive metabolic panel     Status: Abnormal   Collection Time: 04/21/16  5:11 AM  Result Value Ref Range   Sodium 134 (L) 135 - 145 mmol/L   Potassium 3.9 3.5 - 5.1 mmol/L    Comment: DELTA CHECK NOTED REPEATED TO VERIFY    Chloride 99 (L) 101 - 111 mmol/L   CO2 26 22 - 32 mmol/L   Glucose, Bld 91 65 - 99 mg/dL   BUN 5 (L) 6 - 20 mg/dL   Creatinine, Ser 0.80 0.44 - 1.00 mg/dL   Calcium 8.1 (L) 8.9 - 10.3 mg/dL   Total Protein 5.8 (L) 6.5 - 8.1 g/dL   Albumin 2.2 (L) 3.5 - 5.0 g/dL   AST 28 15 - 41 U/L   ALT 10 (L) 14 - 54 U/L   Alkaline Phosphatase 77 38 - 126 U/L   Total Bilirubin 0.5 0.3 - 1.2 mg/dL   GFR  calc non Af Amer >60 >60 mL/min   GFR calc Af Amer >60 >60 mL/min  Comment: (NOTE) The eGFR has been calculated using the CKD EPI equation. This calculation has not been validated in all clinical situations. eGFR's persistently <60 mL/min signify possible Chronic Kidney Disease.    Anion gap 9 5 - 15  Protime-INR     Status: Abnormal   Collection Time: 04/21/16  8:39 AM  Result Value Ref Range   Prothrombin Time 16.8 (H) 11.6 - 15.2 seconds   INR 1.35 0.00 - 1.49  Aerobic/Anaerobic Culture (surgical/deep wound)     Status: None (Preliminary result)   Collection Time: 04/21/16 10:59 AM  Result Value Ref Range   Specimen Description      ABSCESS PELVIS DRAINAGE Performed at [facility]    Special Requests NONE    Gram Stain      ABUNDANT WBC PRESENT, PREDOMINANTLY PMN MODERATE GRAM POSITIVE RODS FEW GRAM NEGATIVE RODS NO SQUAMOUS EPITHELIAL CELLS SEEN    Culture      ABUNDANT GRAM NEGATIVE RODS CULTURE REINCUBATED FOR BETTER GROWTH Performed at [facility]    Report Status PENDING   CBC with Differential/Platelet     Status: Abnormal   Collection Time: 04/22/16  5:12 AM  Result Value Ref Range   WBC 12.4 (H) 4.0 - 10.5 K/uL   RBC 3.91 3.87 - 5.11 MIL/uL   Hemoglobin 12.0 12.0 - 15.0 g/dL   HCT 35.5 (L) 36.0 - 46.0 %   MCV 90.8 78.0 - 100.0 fL   MCH 30.7 26.0 - 34.0 pg   MCHC 33.8 30.0 - 36.0 g/dL   RDW 14.0 11.5 - 15.5 %   Platelets 396 150 - 400 K/uL   Neutrophils Relative % 82 %   Neutro Abs 10.1 (H) 1.7 - 7.7 K/uL   Lymphocytes Relative 13 %   Lymphs Abs 1.6 0.7 - 4.0 K/uL   Monocytes Relative 5 %   Monocytes Absolute 0.6 0.1 - 1.0 K/uL   Eosinophils Relative 0 %   Eosinophils Absolute 0.0 0.0 - 0.7 K/uL   Basophils Relative 0 %   Basophils Absolute 0.0 0.0 - 0.1 K/uL  Comprehensive metabolic panel     Status: Abnormal   Collection Time: 04/22/16  5:12 AM  Result Value Ref Range   Sodium 136 135 - 145 mmol/L   Potassium 4.2 3.5  - 5.1 mmol/L   Chloride 102 101 - 111 mmol/L   CO2 25 22 - 32 mmol/L   Glucose, Bld 94 65 - 99 mg/dL   BUN <5 (L) 6 - 20 mg/dL    Comment: REPEATED TO VERIFY   Creatinine, Ser 0.71 0.44 - 1.00 mg/dL   Calcium 8.1 (L) 8.9 - 10.3 mg/dL   Total Protein 5.4 (L) 6.5 - 8.1 g/dL   Albumin 2.2 (L) 3.5 - 5.0 g/dL   AST 25 15 - 41 U/L   ALT 10 (L) 14 - 54 U/L   Alkaline Phosphatase 66 38 - 126 U/L   Total Bilirubin 0.6 0.3 - 1.2 mg/dL   GFR calc non Af Amer >60 >60 mL/min   GFR calc Af Amer >60 >60 mL/min    Comment: (NOTE) The eGFR has been calculated using the CKD EPI equation. This calculation has not been validated in all clinical situations. eGFR's persistently <60 mL/min signify possible Chronic Kidney Disease.    Anion gap 9 5 - 15      Component Value Date/Time   SDES  04/21/2016 1059    ABSCESS PELVIS DRAINAGE Performed at [facility]  SPECREQUEST NONE 04/21/2016 1059   CULT  04/21/2016 1059    ABUNDANT GRAM NEGATIVE RODS CULTURE REINCUBATED FOR BETTER GROWTH Performed at [facility] PENDING 04/21/2016 1059   Ct Abdomen Pelvis W Contrast  04/20/2016  CLINICAL DATA:  Acute onset of pelvic pain.  Initial encounter. EXAM: CT ABDOMEN AND PELVIS WITH CONTRAST TECHNIQUE: Multidetector CT imaging of the abdomen and pelvis was performed using the standard protocol following bolus administration of intravenous contrast. CONTRAST:  171m ISOVUE-300 IOPAMIDOL (ISOVUE-300) INJECTION 61% COMPARISON:  Pelvic ultrasound, and CT of the abdomen and pelvis performed 04/10/2016 FINDINGS: The visualized lung bases are clear. The liver and spleen are unremarkable in appearance. The gallbladder is within normal limits. The pancreas and adrenal glands are unremarkable. The kidneys are unremarkable in appearance. There is no evidence of hydronephrosis. No renal or ureteral stones are seen. No perinephric stranding is appreciated. The small bowel is unremarkable  in appearance. The stomach is within normal limits. No acute vascular abnormalities are seen. There is a peripherally enhancing diffusely thick-walled abscess noted superior to the uterus, measuring approximately 6.1 x 3.1 x 1.9 cm, which appears contiguous with a complex left-sided tubo-ovarian abscess, which measures approximately 4.9 x 3.5 x 3.2 cm. Scattered air is seen tracking about the pelvic abscess and tubo-ovarian abscess, and a prominent 4.7 cm pocket of air is noted superior to the tubo-ovarian abscess. Diffuse surrounding soft tissue inflammation is noted. A fat plane is minimally preserved between the tubo-ovarian abscess and proximal sigmoid colon, but the pelvic abscess directly abuts the mid sigmoid colon, and there is question of a tract between the mid sigmoid colon and pelvic abscess on coronal images. The amount of air within the abscesses is significantly more than expected for a tubo-ovarian abscess, and this raises concern for focal colonic perforation. This could have arisen either from focal sigmoid diverticulitis formation, or possibly erosion at the sigmoid colon from the tubo-ovarian abscess, extending into the pelvis. The appendix is normal in caliber. There is no evidence of appendicitis. The remainder of the colon is grossly unremarkable in appearance. The sigmoid colon is mildly redundant. The bladder is mildly distended and grossly unremarkable in appearance. The uterus is grossly unremarkable, though an abscess is seen above the uterus. The right ovary is grossly unremarkable, aside from mild inflammation extending about the pelvis. No inguinal lymphadenopathy is seen. No acute osseous abnormalities are identified. IMPRESSION: 1. Diffusely thick-walled peripherally enhancing abscess noted superior to the uterus, measuring 6.1 x 3.1 x 1.9 cm. This appears contiguous with a complex left-sided tubo-ovarian abscess, which measures approximately 4.9 x 3.5 x 3.2 cm. Scattered air tracks  about the pelvic abscess and tubo-ovarian abscess, with a prominent 4.7 cm pocket of air noted superior to the tubo-ovarian abscess. Diffuse surrounding soft tissue inflammation noted. 2. The pelvic abscess directly abuts the mid sigmoid colon, and there is question of a tract between the mid sigmoid colon and pelvic abscess on coronal images. The amount of air within the abscesses is significantly more than expected for a tubo-ovarian abscess origin, and this raises concern for focal colonic perforation. This could have arisen either from focal sigmoid diverticulitis with abscess formation, or possibly erosion at the sigmoid colon from the tubo-ovarian abscess extending into the pelvis. 3. If it is deemed clinically necessary to confirm the presence of a tract between the mid sigmoid colon and pelvic abscess, repeat pelvic CT with rectal contrast could be considered for further evaluation. Critical Value/emergent  results were called by telephone at the time of interpretation on 04/20/2016 at 8:03 pm to Dr. Aloha Gell, who verbally acknowledged these results. Electronically Signed   By: Garald Balding M.D.   On: 04/20/2016 20:17   Ct Image Guided Drainage By Percutaneous Catheter  04/21/2016  CLINICAL DATA:  Progressive left tubo-ovarian abscess with gas and fluid and multiple loculations. EXAM: CT GUIDED DRAINAGE OF PELVIC ABSCESS ANESTHESIA/SEDATION: Intravenous Fentanyl and Versed were administered as conscious sedation during continuous monitoring of the patient's level of consciousness and physiological / cardiorespiratory status by the radiology RN, with a total moderate sedation time of 31 minutes. PROCEDURE: The procedure, risks, benefits, and alternatives were explained to the patient. Questions regarding the procedure were encouraged and answered. The patient understands and consents to the procedure. Axial scans through the pelvis were obtained. The collection was localized and an appropriate skin  entry site was determined and marked. The operative field was prepped with chlorhexidinein a sterile fashion, and a sterile drape was applied covering the operative field. A sterile gown and sterile gloves were used for the procedure. Local anesthesia was provided with 1% Lidocaine. Under intermittent CT guidance, a 19 gauge percutaneous entry needle was advanced into the largest superficial pelvic gas and fluid collection just superior to the uterine fundus. A small amount of scratch the gas could be aspirated. Amplatz guidewire advanced easily within the collection, its position confirmed on CT. Tract dilated to facilitate placement of a 12 French pigtail catheter, formed centrally within the collection. Approximately 7 mL blood bloody aspirate returned, sent for Gram stain and culture. Catheter was flushed, secured externally with 0 Prolene suture and StatLock, and placed to gravity drain bag. The patient tolerated the procedure well. COMPLICATIONS: None immediate FINDINGS: Complex left adnexal and pelvic gas and fluid collection, similar to that seen on previous day's exam. Percutaneous drain catheter placed as above. 7 mL bloody aspirate sent for Gram stain and culture. IMPRESSION: 1. Technically successful pelvic abscess drain catheter placement under CT guidance. Electronically Signed   By: Lucrezia Europe M.D.   On: 04/21/2016 14:04   Recent Results (from the past 240 hour(s))  Urine culture     Status: None   Collection Time: 04/18/16  8:00 PM  Result Value Ref Range Status   Specimen Description URINE, CLEAN CATCH  Final   Special Requests NONE  Final   Culture NO GROWTH Performed at [facility]   Final   Report Status 04/20/2016 FINAL  Final  Aerobic/Anaerobic Culture (surgical/deep wound)     Status: None (Preliminary result)   Collection Time: 04/21/16 10:59 AM  Result Value Ref Range Status   Specimen Description   Final    ABSCESS PELVIS DRAINAGE Performed at [facility]    Special Requests NONE  Final   Gram Stain   Final    ABUNDANT WBC PRESENT, PREDOMINANTLY PMN MODERATE GRAM POSITIVE RODS FEW GRAM NEGATIVE RODS NO SQUAMOUS EPITHELIAL CELLS SEEN    Culture   Final    ABUNDANT GRAM NEGATIVE RODS CULTURE REINCUBATED FOR BETTER GROWTH Performed at [facility]    Report Status PENDING  Incomplete      04/22/2016, 4:30 PM     LOS: 4 days    Records and images were personally reviewed where available.

## 2016-04-22 NOTE — Progress Notes (Signed)
Subjective: She feels better, the drain has very little in it and it looks more like old blood than any purulent fluid. Still having some nausea and not eating much even though she is on a regular diet.  Vomited last PM and this AM.    Objective: Vital signs in last 24 hours: Temp:  [98 F (36.7 C)-98.7 F (37.1 C)] 98.4 F (36.9 C) (07/11 1200) Pulse Rate:  [67-86] 75 (07/11 1200) Resp:  [16-19] 18 (07/11 1200) BP: (105-109)/(68-78) 109/78 mmHg (07/11 1200) SpO2:  [96 %-100 %] 100 % (07/11 1200) Last BM Date: 04/20/16 580 Po Emesis 200 yesterday Drain 5 ml Afebrile, VSS WBC 12.4, CMP OK Intake/Output from previous day: 07/10 0701 - 07/11 0700 In: 4142.9 [P.O.:580; I.V.:2662.9; IV Piggyback:900] Out: 3805 [Urine:3600; Emesis/NG output:200; Drains:5] Intake/Output this shift: Total I/O In: -  Out: 750 [Urine:750]  General appearance: alert, cooperative and no distress GI: she is sore and tender still in the RLQ where the drain is, but much better than before.  Soft abdomen, no distension, few BS but hypoacitive.    Lab Results:   Recent Labs  04/21/16 0511 04/22/16 0512  WBC 10.8* 12.4*  HGB 12.0 12.0  HCT 35.5* 35.5*  PLT 393 396    BMET  Recent Labs  04/21/16 0511 04/22/16 0512  NA 134* 136  K 3.9 4.2  CL 99* 102  CO2 26 25  GLUCOSE 91 94  BUN 5* <5*  CREATININE 0.80 0.71  CALCIUM 8.1* 8.1*   PT/INR  Recent Labs  04/21/16 0839  LABPROT 16.8*  INR 1.35     Recent Labs Lab 04/19/16 0624 04/20/16 0633 04/21/16 0511 04/22/16 0512  AST 11* 14* 28 25  ALT 8* 7* 10* 10*  ALKPHOS 89 77 77 66  BILITOT 0.7 0.7 0.5 0.6  PROT 5.9* 6.2* 5.8* 5.4*  ALBUMIN 2.3* 2.2* 2.2* 2.2*     Lipase     Component Value Date/Time   LIPASE 32 04/10/2016 1126     Studies/Results: Ct Abdomen Pelvis W Contrast  04/20/2016  CLINICAL DATA:  Acute onset of pelvic pain.  Initial encounter. EXAM: CT ABDOMEN AND PELVIS WITH CONTRAST TECHNIQUE: Multidetector CT  imaging of the abdomen and pelvis was performed using the standard protocol following bolus administration of intravenous contrast. CONTRAST:  ISOVUE-300 IOPAMIDOL (ISOVUE-300) INJECTION 61% COMPARISON:  Pelvic ultrasound, and CT of the abdomen and pelvis performed 04/10/2016 FINDINGS: The visualized lung bases are clear. The liver and spleen are unremarkable in appearance. The gallbladder is within normal limits. The pancreas and adrenal glands are unremarkable. The kidneys are unremarkable in appearance. There is no evidence of hydronephrosis. No renal or ureteral stones are seen. No perinephric stranding is appreciated. The small bowel is unremarkable in appearance. The stomach is within normal limits. No acute vascular abnormalities are seen. There is a peripherally enhancing diffusely thick-walled abscess noted superior to the uterus, measuring approximately 6.1 x 3.1 x 1.9 cm, which appears contiguous with a complex left-sided tubo-ovarian abscess, which measures approximately 4.9 x 3.5 x 3.2 cm. Scattered air is seen tracking about the pelvic abscess and tubo-ovarian abscess, and a prominent 4.7 cm pocket of air is noted superior to the tubo-ovarian abscess. Diffuse surrounding soft tissue inflammation is noted. A fat plane is minimally preserved between the tubo-ovarian abscess and proximal sigmoid colon, but the pelvic abscess directly abuts the mid sigmoid colon, and there is question of a tract between the mid sigmoid colon and pelvic abscess on  coronal images. The amount of air within the abscesses is significantly more than expected for a tubo-ovarian abscess, and this raises concern for focal colonic perforation. This could have arisen either from focal sigmoid diverticulitis formation, or possibly erosion at the sigmoid colon from the tubo-ovarian abscess, extending into the pelvis. The appendix is normal in caliber. There is no evidence of appendicitis. The remainder of the colon is grossly  unremarkable in appearance. The sigmoid colon is mildly redundant. The bladder is mildly distended and grossly unremarkable in appearance. The uterus is grossly unremarkable, though an abscess is seen above the uterus. The right ovary is grossly unremarkable, aside from mild inflammation extending about the pelvis. No inguinal lymphadenopathy is seen. No acute osseous abnormalities are identified. IMPRESSION: 1. Diffusely thick-walled peripherally enhancing abscess noted superior to the uterus, measuring 6.1 x 3.1 x 1.9 cm. This appears contiguous with a complex left-sided tubo-ovarian abscess, which measures approximately 4.9 x 3.5 x 3.2 cm. Scattered air tracks about the pelvic abscess and tubo-ovarian abscess, with a prominent 4.7 cm pocket of air noted superior to the tubo-ovarian abscess. Diffuse surrounding soft tissue inflammation noted. 2. The pelvic abscess directly abuts the mid sigmoid colon, and there is question of a tract between the mid sigmoid colon and pelvic abscess on coronal images. The amount of air within the abscesses is significantly more than expected for a tubo-ovarian abscess origin, and this raises concern for focal colonic perforation. This could have arisen either from focal sigmoid diverticulitis with abscess formation, or possibly erosion at the sigmoid colon from the tubo-ovarian abscess extending into the pelvis. 3. If it is deemed clinically necessary to confirm the presence of a tract between the mid sigmoid colon and pelvic abscess, repeat pelvic CT with rectal contrast could be considered for further evaluation. Critical Value/emergent results were called by telephone at the time of interpretation on 04/20/2016 at 8:03 pm to Dr. Noland FordyceKELLY FOGLEMAN, who verbally acknowledged these results. Electronically Signed   By: Roanna RaiderJeffery  Chang M.D.   On: 04/20/2016 20:17   Ct Image Guided Drainage By Percutaneous Catheter  04/21/2016  CLINICAL DATA:  Progressive left tubo-ovarian abscess with  gas and fluid and multiple loculations. EXAM: CT GUIDED DRAINAGE OF PELVIC ABSCESS ANESTHESIA/SEDATION: Intravenous Fentanyl and Versed were administered as conscious sedation during continuous monitoring of the patient's level of consciousness and physiological / cardiorespiratory status by the radiology RN, with a total moderate sedation time of 31 minutes. PROCEDURE: The procedure, risks, benefits, and alternatives were explained to the patient. Questions regarding the procedure were encouraged and answered. The patient understands and consents to the procedure. Axial scans through the pelvis were obtained. The collection was localized and an appropriate skin entry site was determined and marked. The operative field was prepped with chlorhexidinein a sterile fashion, and a sterile drape was applied covering the operative field. A sterile gown and sterile gloves were used for the procedure. Local anesthesia was provided with 1% Lidocaine. Under intermittent CT guidance, a 19 gauge percutaneous entry needle was advanced into the largest superficial pelvic gas and fluid collection just superior to the uterine fundus. A small amount of scratch the gas could be aspirated. Amplatz guidewire advanced easily within the collection, its position confirmed on CT. Tract dilated to facilitate placement of a 12 French pigtail catheter, formed centrally within the collection. Approximately 7 mL blood bloody aspirate returned, sent for Gram stain and culture. Catheter was flushed, secured externally with 0 Prolene suture and StatLock, and placed to gravity  drain bag. The patient tolerated the procedure well. COMPLICATIONS: None immediate FINDINGS: Complex left adnexal and pelvic gas and fluid collection, similar to that seen on previous day's exam. Percutaneous drain catheter placed as above. 7 mL bloody aspirate sent for Gram stain and culture. IMPRESSION: 1. Technically successful pelvic abscess drain catheter placement under  CT guidance. Electronically Signed   By: Corlis Leak M.D.   On: 04/21/2016 14:04   Prior to Admission medications   Medication Sig Start Date End Date Taking? Authorizing Provider  ondansetron (ZOFRAN ODT) 4 MG disintegrating tablet Take 1 tablet (4 mg total) by mouth every 8 (eight) hours as needed for nausea or vomiting. 04/10/16  Yes Barrett Henle, PA-C  oxyCODONE-acetaminophen (PERCOCET/ROXICET) 5-325 MG tablet Take 1 tablet by mouth every 2 (two) hours as needed for severe pain.   Yes Historical Provider, MD  oxyCODONE (ROXICODONE) 5 MG immediate release tablet Take 1 tablet (5 mg total) by mouth every 4 (four) hours as needed for severe pain. Patient not taking: Reported on 04/18/2016 04/10/16   Barrett Henle, PA-C     Medications: . sodium chloride   Intravenous Once  . clindamycin (CLEOCIN) IV  900 mg Intravenous 2 times per day  . famotidine (PEPCID) IV  20 mg Intravenous Q12H  . gentamicin (GARAMYCIN) with clindamycin (CLEOCIN) IV   Intravenous Q24H  . metronidazole  500 mg Intravenous Q6H  . polyethylene glycol  17 g Oral Daily   . 0.9 % sodium chloride with kcl 100 mL/hr at 04/22/16 1610   Hx of gestational diabetes Hx of polycystic ovarian syndrome Depression Treated for PID 04/14/16  Assessment/Plan Left tuboovarian cyst with ongoing pelvic pain IR placement of Drain 04/21/16 Hx of gestational diabetes Hx of polycystic ovarian syndrome Depression FEN:  IV fluids/regular diet ID: day 5 clindamycin/gentamycin/ Flagyl added today  (gram negatives on culture yesterday) DVT: SCD  Plan:  Continue antibiotics, back off on PO's, I told her to have them get her up and walk some.  Recheck CBC in AM.       LOS: 4 days    Nahjae Hoeg 04/22/2016 [phone]

## 2016-04-22 NOTE — Progress Notes (Signed)
Patient ID: Sheila Willis, female   DOB: Feb 12, 1986, 30 y.o.   MRN: [id number]    Referring Physician(s): Darrold Span  Supervising Physician: Irish Lack  Patient Status:  Inpatient  Chief Complaint:  Tubo-ovarian abscess  Subjective:  Pt with occ nausea, persistent pelvic pain per nursing; taking liquids but not much solid  Allergies: Latex  Medications: Prior to Admission medications   Medication Sig Start Date End Date Taking? Authorizing Provider  ondansetron (ZOFRAN ODT) 4 MG disintegrating tablet Take 1 tablet (4 mg total) by mouth every 8 (eight) hours as needed for nausea or vomiting. 04/10/16  Yes Barrett Henle, PA-C  oxyCODONE-acetaminophen (PERCOCET/ROXICET) 5-325 MG tablet Take 1 tablet by mouth every 2 (two) hours as needed for severe pain.   Yes Historical Provider, MD  oxyCODONE (ROXICODONE) 5 MG immediate release tablet Take 1 tablet (5 mg total) by mouth every 4 (four) hours as needed for severe pain. Patient not taking: Reported on 04/18/2016 04/10/16   Barrett Henle, PA-C     Vital Signs: BP 109/78 mmHg  Pulse 75  Temp(Src) 98.4 F (36.9 C) (Oral)  Resp 18  Ht 5\' 2"  (1.575 m)  Wt 134 lb (60.782 kg)  BMI 24.50 kg/m2  SpO2 100%  Physical Exam pelvic drain intact, output minimal amt bloody fluid; cx's pend(gm - rods)  Imaging: Ct Abdomen Pelvis W Contrast  04/20/2016  CLINICAL DATA:  Acute onset of pelvic pain.  Initial encounter. EXAM: CT ABDOMEN AND PELVIS WITH CONTRAST TECHNIQUE: Multidetector CT imaging of the abdomen and pelvis was performed using the standard protocol following bolus administration of intravenous contrast. CONTRAST:  ISOVUE-300 IOPAMIDOL (ISOVUE-300) INJECTION 61% COMPARISON:  Pelvic ultrasound, and CT of the abdomen and pelvis performed 04/10/2016 FINDINGS: The visualized lung bases are clear. The liver and spleen are unremarkable in appearance. The gallbladder is within normal limits. The pancreas and  adrenal glands are unremarkable. The kidneys are unremarkable in appearance. There is no evidence of hydronephrosis. No renal or ureteral stones are seen. No perinephric stranding is appreciated. The small bowel is unremarkable in appearance. The stomach is within normal limits. No acute vascular abnormalities are seen. There is a peripherally enhancing diffusely thick-walled abscess noted superior to the uterus, measuring approximately 6.1 x 3.1 x 1.9 cm, which appears contiguous with a complex left-sided tubo-ovarian abscess, which measures approximately 4.9 x 3.5 x 3.2 cm. Scattered air is seen tracking about the pelvic abscess and tubo-ovarian abscess, and a prominent 4.7 cm pocket of air is noted superior to the tubo-ovarian abscess. Diffuse surrounding soft tissue inflammation is noted. A fat plane is minimally preserved between the tubo-ovarian abscess and proximal sigmoid colon, but the pelvic abscess directly abuts the mid sigmoid colon, and there is question of a tract between the mid sigmoid colon and pelvic abscess on coronal images. The amount of air within the abscesses is significantly more than expected for a tubo-ovarian abscess, and this raises concern for focal colonic perforation. This could have arisen either from focal sigmoid diverticulitis formation, or possibly erosion at the sigmoid colon from the tubo-ovarian abscess, extending into the pelvis. The appendix is normal in caliber. There is no evidence of appendicitis. The remainder of the colon is grossly unremarkable in appearance. The sigmoid colon is mildly redundant. The bladder is mildly distended and grossly unremarkable in appearance. The uterus is grossly unremarkable, though an abscess is seen above the uterus. The right ovary is grossly unremarkable, aside from mild inflammation extending about the pelvis.  No inguinal lymphadenopathy is seen. No acute osseous abnormalities are identified. IMPRESSION: 1. Diffusely thick-walled  peripherally enhancing abscess noted superior to the uterus, measuring 6.1 x 3.1 x 1.9 cm. This appears contiguous with a complex left-sided tubo-ovarian abscess, which measures approximately 4.9 x 3.5 x 3.2 cm. Scattered air tracks about the pelvic abscess and tubo-ovarian abscess, with a prominent 4.7 cm pocket of air noted superior to the tubo-ovarian abscess. Diffuse surrounding soft tissue inflammation noted. 2. The pelvic abscess directly abuts the mid sigmoid colon, and there is question of a tract between the mid sigmoid colon and pelvic abscess on coronal images. The amount of air within the abscesses is significantly more than expected for a tubo-ovarian abscess origin, and this raises concern for focal colonic perforation. This could have arisen either from focal sigmoid diverticulitis with abscess formation, or possibly erosion at the sigmoid colon from the tubo-ovarian abscess extending into the pelvis. 3. If it is deemed clinically necessary to confirm the presence of a tract between the mid sigmoid colon and pelvic abscess, repeat pelvic CT with rectal contrast could be considered for further evaluation. Critical Value/emergent results were called by telephone at the time of interpretation on 04/20/2016 at 8:03 pm to Dr. Noland Fordyce, who verbally acknowledged these results. Electronically Signed   By: Roanna Raider M.D.   On: 04/20/2016 20:17   Ct Image Guided Drainage By Percutaneous Catheter  04/21/2016  CLINICAL DATA:  Progressive left tubo-ovarian abscess with gas and fluid and multiple loculations. EXAM: CT GUIDED DRAINAGE OF PELVIC ABSCESS ANESTHESIA/SEDATION: Intravenous Fentanyl and Versed were administered as conscious sedation during continuous monitoring of the patient's level of consciousness and physiological / cardiorespiratory status by the radiology RN, with a total moderate sedation time of 31 minutes. PROCEDURE: The procedure, risks, benefits, and alternatives were explained to  the patient. Questions regarding the procedure were encouraged and answered. The patient understands and consents to the procedure. Axial scans through the pelvis were obtained. The collection was localized and an appropriate skin entry site was determined and marked. The operative field was prepped with chlorhexidinein a sterile fashion, and a sterile drape was applied covering the operative field. A sterile gown and sterile gloves were used for the procedure. Local anesthesia was provided with 1% Lidocaine. Under intermittent CT guidance, a 19 gauge percutaneous entry needle was advanced into the largest superficial pelvic gas and fluid collection just superior to the uterine fundus. A small amount of scratch the gas could be aspirated. Amplatz guidewire advanced easily within the collection, its position confirmed on CT. Tract dilated to facilitate placement of a 12 French pigtail catheter, formed centrally within the collection. Approximately 7 mL blood bloody aspirate returned, sent for Gram stain and culture. Catheter was flushed, secured externally with 0 Prolene suture and StatLock, and placed to gravity drain bag. The patient tolerated the procedure well. COMPLICATIONS: None immediate FINDINGS: Complex left adnexal and pelvic gas and fluid collection, similar to that seen on previous day's exam. Percutaneous drain catheter placed as above. 7 mL bloody aspirate sent for Gram stain and culture. IMPRESSION: 1. Technically successful pelvic abscess drain catheter placement under CT guidance. Electronically Signed   By: Corlis Leak M.D.   On: 04/21/2016 14:04    Labs:  CBC:  Recent Labs  04/19/16 0624 04/20/16 0633 04/21/16 0511 04/22/16 0512  WBC 17.6* 11.4* 10.8* 12.4*  HGB 11.7* 11.2* 12.0 12.0  HCT 34.0* 33.1* 35.5* 35.5*  PLT 377 387 393 396  COAGS:  Recent Labs  04/21/16 0839  INR 1.35    BMP:  Recent Labs  04/19/16 0624 04/20/16 0633 04/21/16 0511 04/22/16 0512  NA 135  137 134* 136  K 3.0* 3.1* 3.9 4.2  CL 99* 101 99* 102  CO2 25 27 26 25   GLUCOSE 99 100* 91 94  BUN 7 6 5* <5*  CALCIUM 7.9* 8.1* 8.1* 8.1*  CREATININE 0.81 0.75 0.80 0.71  GFRNONAA >60 >60 >60 >60  GFRAA >60 >60 >60 >60    LIVER FUNCTION TESTS:  Recent Labs  04/19/16 0624 04/20/16 0633 04/21/16 0511 04/22/16 0512  BILITOT 0.7 0.7 0.5 0.6  AST 11* 14* 28 25  ALT 8* 7* 10* 10*  ALKPHOS 89 77 77 66  PROT 5.9* 6.2* 5.8* 5.4*  ALBUMIN 2.3* 2.2* 2.2* 2.2*    Assessment and Plan: S/p drainage of complex left adnexal gas/fluid collection/TOA 7/10; AF; WBC 12.4(10.8); hgb stable; check fluid cx's/sens(gm neg rods); initiate drain irrigation ; consider ID consult; will need f/u CT within 1 week to reassess collection   Electronically Signed: D. Jeananne RamaKevin Huntington Leverich 04/22/2016, 2:19 PM   I spent a total of 15 minutes at the the patient's bedside AND on the patient's hospital floor or unit, greater than 50% of which was counseling/coordinating care for pelvic abscess drain

## 2016-04-23 LAB — CBC
HCT: 34.8 % — ABNORMAL LOW (ref 36.0–46.0)
Hemoglobin: 11.7 g/dL — ABNORMAL LOW (ref 12.0–15.0)
MCH: 30.7 pg (ref 26.0–34.0)
MCHC: 33.6 g/dL (ref 30.0–36.0)
MCV: 91.3 fL (ref 78.0–100.0)
PLATELETS: 372 10*3/uL (ref 150–400)
RBC: 3.81 MIL/uL — AB (ref 3.87–5.11)
RDW: 14.1 % (ref 11.5–15.5)
WBC: 11 10*3/uL — AB (ref 4.0–10.5)

## 2016-04-23 NOTE — Progress Notes (Signed)
HD#6 TOA POD#2 s/p CT guided percutaneous drain placement S/p 5 days Gent/Clinda/Flagyl now day 2 Invanz  HPI:. Pt notes improvement in abdominal pain and was able to sleep on side. New onset pain in R buttocks. Pt notes discomfort at drain site. Tolerated small po (small slice of pizza and quesadilla) for dinner. Not taking Boost due to poor taste. No emesis since yesterday afternoon. No BM since 7/9 but flatus today. Pain- rates 9/10, does respond to IV Dilaudid, getting  IV q 2 hrs though twice overnight was able to stretch to q3hrs. No fevers.  Walked twice yesterday.   PE: Filed Vitals:   04/22/16 1200 04/22/16 1758 04/22/16 2117 04/23/16 0556  BP: 109/78 107/71 119/76 103/71  Pulse: 75 81 91 64  Temp: 98.4 F (36.9 C) 98.7 F (37.1 C) 98.2 F (36.8 C) 98.2 F (36.8 C)  TempSrc: Oral Oral Oral Oral  Resp: Height:      Weight:      SpO2: 100% 98% 99% 100%   Gen: Alert, cooperative, no acute distress Abd: soft, no distension, no rebound, no guarding, pain with palpation in BLQ, L>R, no longer having referred pain with palpation in upper abdomen, no tympany.  Pain improved from yesterday. Percutaneous drain in place, minimal serosanguinous output.  GU: def today LE: no edema, NT, SCDs in place  UCx negative GC/CT neg/ HIV neg Gram stain from fluid: G pos rods, Gram neg rods- prelim  CBC Latest Ref Rng 04/23/2016 04/22/2016 04/21/2016  WBC 4.0 - 10.5 K/uL 11.0(H) 12.4(H) 10.8(H)  Hemoglobin 12.0 - 15.0 g/dL 11.7(L) 12.0 12.0  Hematocrit 36.0 - 46.0 % 34.8(L) 35.5(L) 35.5(L)  Platelets 150 - 400 K/uL 372 396 393   CT 7/9: Diffusely thick-walled peripherally enhancing abscess noted superior to the uterus, measuring 6.1 x 3.1 x 1.9 cm. This appears contiguous with a complex left-sided tubo-ovarian abscess, which measures approximately 4.9 x 3.5 x 3.2 cm. Scattered air tracks about the pelvic abscess and tubo-ovarian abscess, with a prominent 4.7 cm pocket of air  noted superior to the tubo-ovarian abscess. Diffuse surrounding soft tissue inflammation noted. 2. The pelvic abscess directly abuts the mid sigmoid colon, and there is question of a tract between the mid sigmoid colon and pelvic abscess on coronal images. The amount of air within the abscesses is significantly more than expected for a tubo-ovarian abscess origin, and this raises concern for focal colonic perforation. This could have arisen either from focal sigmoid diverticulitis with abscess formation, or possibly erosion at the sigmoid colon from the tubo-ovarian abscess extending into the Pelvis.  CMP     Component Value Date/Time   NA 136 04/22/2016 0512   K 4.2 04/22/2016 0512   CL 102 04/22/2016 0512   CO2 25 04/22/2016 0512   GLUCOSE 94 04/22/2016 0512   BUN <5* 04/22/2016 0512   CREATININE 0.71 04/22/2016 0512   CALCIUM 8.1* 04/22/2016 0512   PROT 5.4* 04/22/2016 0512   ALBUMIN 2.2* 04/22/2016 0512   AST 25 04/22/2016 0512   ALT 10* 04/22/2016 0512   ALKPHOS 66 04/22/2016 0512   BILITOT 0.6 04/22/2016 0512   GFRNONAA >60 04/22/2016 0512   GFRAA >60 04/22/2016 8295     A/P:29 yo G2P2, non-preg patient with acute pelvic pain due to TOA. [name]. GC/CT/ cultures negative. Species identification pending from percutaneous drain placed into abscess by [facility] on 04/21/16. Since placement, only small amount of drainage. Pt did 5 days  IV gent, clindamycin and flagyl, started on admission and switched on 7/11 to Invanz at the recommendation of ID. Clinically pt is improving with improvement in pain and resolution of leukocytosis. Suspicion for sigmoid perforation on CT 04/20/16 and appreciate input from general surgery who do not think there is a  bowel perforation They are recommending non-surgical approach at this time due to high risks of operative intervention in the setting of acute infection in a stable patient. This aligns with recommended care for a TOA- treat with  antibiotics, drain if possible, consider surgery if pt worsening or an interval surgery with persistent pain once infection and inflammation resolved. Appreciated general surgery consult. [facility] managing drain- defer to them for repeat imaging study and decision to remove drain. For TOA would plan at least 2 wks antibiotics but once pain controlled and pt able to tolerate po, she can be followed as an outpt. Currently, nausea and pain are requiring IV control and pt continues to meet inpt criteria. Appreciate ID expertise in choosing antibiotic coverage for IV and for oral/ outpt use.   - Pain, continue 1mg  Dilaudid IV, q 2 hrs for pain control. Will transition to oral meds when pt able to tolerate better po.   - Nausea and emesis, continue meds as needed.  Constipation, add Miralax.  - dispo, needs continued inpt admission for antibiotics, pain control. Will continue to work toward discharge criteria.   - SCD's, walk each shift   - Contraception. IUD removed. Alternative options d/w pt. Will plan Nexplanon as outpt. Avoid intrauterine contraception for at least 3 months.   Sheila Willis A. 04/23/2016 8:26 AM

## 2016-04-23 NOTE — Progress Notes (Signed)
INFECTIOUS DISEASE PROGRESS NOTE  ID: Glendine A Eldredge is a 30 y.o. female with  Active Problems:   Pelvic pain in female   TOA (tubo-ovarian abscess)  Subjective: Feels better BM x2 today  Abtx:  Anti-infectives    Start     Dose/Rate Route Frequency Ordered Stop   04/22/16 1800  ertapenem (INVANZ) 1 g in sodium chloride 0.9 % 50 mL IVPB     1 g 100 mL/hr over 30 Minutes Intravenous Every 24 hours 04/22/16 1707     04/22/16 1700  ertapenem [facility]) injection 1 g  Status:  Discontinued     1 g Intramuscular Every 24 hours 04/22/16 1653 04/22/16 1707   04/19/16 0200  clindamycin (CLEOCIN) IVPB 900 mg  Status:  Discontinued     900 mg 100 mL/hr over 30 Minutes Intravenous 2 times per day 04/18/16 1607 04/22/16 1653   04/18/16 1800  gentamicin (GARAMYCIN) 300 mg, clindamycin (CLEOCIN) 900 mg in dextrose 5 % 100 mL IVPB  Status:  Discontinued     227 mL/hr over 30 Minutes Intravenous Every 24 hours 04/18/16 1637 04/22/16 1653   04/18/16 1700  metroNIDAZOLE (FLAGYL) IVPB 500 mg  Status:  Discontinued     500 mg 100 mL/hr over 60 Minutes Intravenous Every 6 hours 04/18/16 1607 04/22/16 1653   04/18/16 1615  gentamicin (GARAMYCIN) 5 mg/kg in dextrose 5 % 100 mL IVPB  Status:  Discontinued     5 mg/kg (Adjusted) 100 mL/hr over 60 Minutes Intravenous Every 24 hours 04/18/16 1607 04/18/16 1635      Medications:  Scheduled: . sodium chloride   Intravenous Once  . ertapenem  1 g Intravenous Q24H  . famotidine (PEPCID) IV  20 mg Intravenous Q12H  . feeding supplement  1 Container Oral TID BM  . polyethylene glycol  17 g Oral Daily    Objective: Vital signs in last 24 hours: Temp:  [98.2 F (36.8 C)-98.7 F (37.1 C)] 98.2 F (36.8 C) (07/12 1200) Pulse Rate:  [64-91] 86 (07/12 1200) Resp:  [14-18] 16 (07/12 1200) BP: (103-119)/(71-76) 108/76 mmHg (07/12 1200) SpO2:  [98 %-100 %] 100 % (07/12 1200)   General appearance: alert, cooperative and no distress Resp: clear to  auscultation bilaterally Cardio: regular rate and rhythm GI: normal findings: bowel sounds normal and sot, gaurding with palpation of lower abd.   Lab Results  Recent Labs  04/21/16 0511 04/22/16 0512 04/23/16 0401  WBC 10.8* 12.4* 11.0*  HGB 12.0 12.0 11.7*  HCT 35.5* 35.5* 34.8*  NA 134* 136  --   K 3.9 4.2  --   CL 99* 102  --   CO2 26 25  --   BUN 5* <5*  --   CREATININE 0.80 0.71  --    Liver Panel  Recent Labs  04/21/16 0511 04/22/16 0512  PROT 5.8* 5.4*  ALBUMIN 2.2* 2.2*  AST 28 25  ALT 10* 10*  ALKPHOS 77 66  BILITOT 0.5 0.6   Sedimentation Rate No results for input(s): ESRSEDRATE in the last 72 hours. C-Reactive Protein No results for input(s): CRP in the last 72 hours.  Microbiology: Recent Results (from the past 240 hour(s))  Urine culture     Status: None   Collection Time: 04/18/16  8:00 PM  Result Value Ref Range Status   Specimen Description URINE, CLEAN CATCH  Final   Special Requests NONE  Final   Culture NO GROWTH Performed at [facility]   Final  Report Status 04/20/2016 FINAL  Final  Aerobic/Anaerobic Culture (surgical/deep wound)     Status: None (Preliminary result)   Collection Time: 04/21/16 10:59 AM  Result Value Ref Range Status   Specimen Description   Final    ABSCESS PELVIS DRAINAGE Performed at [facility]    Special Requests NONE  Final   Gram Stain   Final    ABUNDANT WBC PRESENT, PREDOMINANTLY PMN MODERATE GRAM POSITIVE RODS FEW GRAM NEGATIVE RODS NO SQUAMOUS EPITHELIAL CELLS SEEN Performed [facility]    Culture   Final    ABUNDANT GRAM NEGATIVE RODS IDENTIFICATION AND SUSCEPTIBILITIES TO FOLLOW NO ANAEROBES ISOLATED; CULTURE IN PROGRESS FOR 5 DAYS    Report Status PENDING  Incomplete    Studies/Results: No results found.   Assessment/Plan: TOA  Better, less pain  Continue invanz for now Await C & S, hopefully will allow us to narrow her atbx Await repeat  CT  Total days of antibiotics: 5 invanz         Johny SaxJeffrey Eartha Vonbehren Infectious Diseases (pager) [phone] [email or web address] 04/23/2016, 1:44 PM  LOS: 5 days

## 2016-04-23 NOTE — Progress Notes (Signed)
Subjective: Perhaps incrementally better, site OK.  The drain is a cloudy old blood color.  16 ml yesterday.    Objective: Vital signs in last 24 hours: Temp:  [98.2 F (36.8 C)-98.7 F (37.1 C)] 98.2 F (36.8 C) (07/12 0556) Pulse Rate:  [64-91] 64 (07/12 0556) Resp:  [14-18] 14 (07/12 0556) BP: (103-119)/(71-78) 103/71 mmHg (07/12 0556) SpO2:  [98 %-100 %] 100 % (07/12 0556) Last BM Date: 04/21/16 480 PO 2500 urine Afebrile, VSS WBC down to 11K Intake/Output from previous day: 07/11 0701 - 07/12 0700 In: 480 [P.O.:480] Out: 2516 [Urine:2500; Drains:16] Intake/Output this shift: Total I/O In: 480 [P.O.:480] Out: 860 [Urine:850; Drains:10]  General appearance: alert, cooperative and no distress GI: soft, still tender RLQ and some in the mid lower abdominal area.  No peritonitis, abdomen is soft, + BS.  Lab Results:   Recent Labs  04/22/16 0512 04/23/16 0401  WBC 12.4* 11.0*  HGB 12.0 11.7*  HCT 35.5* 34.8*  PLT 396 372    BMET  Recent Labs  04/21/16 0511 04/22/16 0512  NA 134* 136  K 3.9 4.2  CL 99* 102  CO2 26 25  GLUCOSE 91 94  BUN 5* <5*  CREATININE 0.80 0.71  CALCIUM 8.1* 8.1*   PT/INR  Recent Labs  04/21/16 0839  LABPROT 16.8*  INR 1.35     Recent Labs Lab 04/19/16 0624 04/20/16 0633 04/21/16 0511 04/22/16 0512  AST 11* 14* 28 25  ALT 8* 7* 10* 10*  ALKPHOS 89 77 77 66  BILITOT 0.7 0.7 0.5 0.6  PROT 5.9* 6.2* 5.8* 5.4*  ALBUMIN 2.3* 2.2* 2.2* 2.2*     Lipase     Component Value Date/Time   LIPASE 32 04/10/2016 1126     Studies/Results: Ct Image Guided Drainage By Percutaneous Catheter  04/21/2016  CLINICAL DATA:  Progressive left tubo-ovarian abscess with gas and fluid and multiple loculations. EXAM: CT GUIDED DRAINAGE OF PELVIC ABSCESS ANESTHESIA/SEDATION: Intravenous Fentanyl and Versed were administered as conscious sedation during continuous monitoring of the patient's level of consciousness and physiological /  cardiorespiratory status by the radiology RN, with a total moderate sedation time of 31 minutes. PROCEDURE: The procedure, risks, benefits, and alternatives were explained to the patient. Questions regarding the procedure were encouraged and answered. The patient understands and consents to the procedure. Axial scans through the pelvis were obtained. The collection was localized and an appropriate skin entry site was determined and marked. The operative field was prepped with chlorhexidinein a sterile fashion, and a sterile drape was applied covering the operative field. A sterile gown and sterile gloves were used for the procedure. Local anesthesia was provided with 1% Lidocaine. Under intermittent CT guidance, a 19 gauge percutaneous entry needle was advanced into the largest superficial pelvic gas and fluid collection just superior to the uterine fundus. A small amount of scratch the gas could be aspirated. Amplatz guidewire advanced easily within the collection, its position confirmed on CT. Tract dilated to facilitate placement of a 12 French pigtail catheter, formed centrally within the collection. Approximately 7 mL blood bloody aspirate returned, sent for Gram stain and culture. Catheter was flushed, secured externally with 0 Prolene suture and StatLock, and placed to gravity drain bag. The patient tolerated the procedure well. COMPLICATIONS: None immediate FINDINGS: Complex left adnexal and pelvic gas and fluid collection, similar to that seen on previous day's exam. Percutaneous drain catheter placed as above. 7 mL bloody aspirate sent for Gram stain and culture. IMPRESSION: 1.  Technically successful pelvic abscess drain catheter placement under CT guidance. Electronically Signed   By: Corlis Leak  Hassell M.D.   On: 04/21/2016 14:04    Medications: . sodium chloride   Intravenous Once  . ertapenem  1 g Intravenous Q24H  . famotidine (PEPCID) IV  20 mg Intravenous Q12H  . feeding supplement  1 Container Oral  TID BM  . polyethylene glycol  17 g Oral Daily    Assessment/Plan Left tuboovarian cyst with ongoing pelvic pain IR placement of Drain 04/21/16 Hx of gestational diabetes Hx of polycystic ovarian syndrome Depression FEN: IV fluids/regular diet ID: day 5 clindamycin/gentamycin/ Flagyl/ Invanz started on 04/22/16 day 2 (gram negatives on culture yesterday) DVT: SCD   Plan: continue medical management      LOS: 5 days    Sehar Sedano 04/23/2016 [phone]

## 2016-04-24 ENCOUNTER — Ambulatory Visit (HOSPITAL_COMMUNITY)
Admit: 2016-04-24 | Discharge: 2016-04-24 | Disposition: A | Discharge status: Home / Self Care | Payer: Commercial Managed Care - PPO | Attending: General Surgery | Admitting: General Surgery

## 2016-04-24 ENCOUNTER — Inpatient Hospital Stay (HOSPITAL_COMMUNITY): Payer: Commercial Managed Care - PPO

## 2016-04-24 DIAGNOSIS — B9620 Unspecified Escherichia coli [E. coli] as the cause of diseases classified elsewhere: Secondary | ICD-10-CM

## 2016-04-24 DIAGNOSIS — L988 Other specified disorders of the skin and subcutaneous tissue: Secondary | ICD-10-CM

## 2016-04-24 LAB — CBC WITH DIFFERENTIAL/PLATELET
BASOS ABS: 0 10*3/uL (ref 0.0–0.1)
BASOS PCT: 0 %
EOS PCT: 1 %
Eosinophils Absolute: 0.1 10*3/uL (ref 0.0–0.7)
HEMATOCRIT: 34 % — AB (ref 36.0–46.0)
Hemoglobin: 11.2 g/dL — ABNORMAL LOW (ref 12.0–15.0)
LYMPHS PCT: 28 %
Lymphs Abs: 2.9 10*3/uL (ref 0.7–4.0)
MCH: 30.3 pg (ref 26.0–34.0)
MCHC: 32.9 g/dL (ref 30.0–36.0)
MCV: 91.9 fL (ref 78.0–100.0)
MONOS PCT: 5 %
Monocytes Absolute: 0.5 10*3/uL (ref 0.1–1.0)
NEUTROS PCT: 66 %
Neutro Abs: 6.9 10*3/uL (ref 1.7–7.7)
Other: 0 %
Platelets: 370 10*3/uL (ref 150–400)
RBC: 3.7 MIL/uL — ABNORMAL LOW (ref 3.87–5.11)
RDW: 14.3 % (ref 11.5–15.5)
WBC: 10.4 10*3/uL (ref 4.0–10.5)

## 2016-04-24 LAB — COMPREHENSIVE METABOLIC PANEL
ALK PHOS: 71 U/L (ref 38–126)
ALT: 10 U/L — ABNORMAL LOW (ref 14–54)
ANION GAP: 5 (ref 5–15)
AST: 19 U/L (ref 15–41)
Albumin: 2.1 g/dL — ABNORMAL LOW (ref 3.5–5.0)
BILIRUBIN TOTAL: 0.5 mg/dL (ref 0.3–1.2)
CALCIUM: 8 mg/dL — AB (ref 8.9–10.3)
CO2: 27 mmol/L (ref 22–32)
Chloride: 104 mmol/L (ref 101–111)
Creatinine, Ser: 0.78 mg/dL (ref 0.44–1.00)
GFR calc Af Amer: >60 mL/min (ref >60)
GLUCOSE: 108 mg/dL — AB (ref 65–99)
Potassium: 3.9 mmol/L (ref 3.5–5.1)
Sodium: 136 mmol/L (ref 135–145)
TOTAL PROTEIN: 5.1 g/dL — AB (ref 6.5–8.1)

## 2016-04-24 MED ORDER — MIDAZOLAM HCL 2 MG/2ML IJ SOLN
INTRAMUSCULAR | Status: AC
  Filled 2016-04-24: qty 6

## 2016-04-24 MED ORDER — OXYCODONE-ACETAMINOPHEN 5-325 MG PO TABS
1.0000 | ORAL_TABLET | ORAL | Status: DC | PRN
  Administered 2016-04-24 – 2016-04-25 (×2): 2 via ORAL
  Administered 2016-04-25: 1 via ORAL
  Administered 2016-04-25 – 2016-04-27 (×9): 2 via ORAL
  Filled 2016-04-24: qty 1
  Filled 2016-04-24 (×13): qty 2

## 2016-04-24 MED ORDER — IOPAMIDOL (ISOVUE-300) INJECTION 61%
50.0000 mL | Freq: Once | INTRAVENOUS | Status: AC | PRN
  Administered 2016-04-24: 12 mL

## 2016-04-24 MED ORDER — HYDROMORPHONE HCL 2 MG/ML IJ SOLN
INTRAMUSCULAR | Status: AC
  Administered 2016-04-24: 1 mg
  Filled 2016-04-24: qty 1

## 2016-04-24 MED ORDER — FENTANYL CITRATE (PF) 100 MCG/2ML IJ SOLN
INTRAMUSCULAR | Status: AC
  Filled 2016-04-24: qty 4

## 2016-04-24 MED ORDER — MIDAZOLAM HCL 2 MG/2ML IJ SOLN
INTRAMUSCULAR | Status: AC | PRN
Start: 2016-04-24 — End: 2016-04-24
  Administered 2016-04-24: 1 mg via INTRAVENOUS

## 2016-04-24 MED ORDER — IOPAMIDOL (ISOVUE-300) INJECTION 61%
100.0000 mL | Freq: Once | INTRAVENOUS | Status: AC | PRN
  Administered 2016-04-24: 100 mL via INTRAVENOUS

## 2016-04-24 MED ORDER — FENTANYL CITRATE (PF) 100 MCG/2ML IJ SOLN
INTRAMUSCULAR | Status: AC | PRN
  Administered 2016-04-24 (×2): 50 ug via INTRAVENOUS

## 2016-04-24 MED ORDER — HEPARIN SODIUM (PORCINE) 5000 UNIT/ML IJ SOLN
5000.0000 [IU] | Freq: Three times a day (TID) | INTRAMUSCULAR | Status: DC
  Administered 2016-04-25 – 2016-05-01 (×19): 5000 [IU] via SUBCUTANEOUS
  Filled 2016-04-24 (×19): qty 1

## 2016-04-24 MED ORDER — FAMOTIDINE 20 MG PO TABS
20.0000 mg | ORAL_TABLET | Freq: Two times a day (BID) | ORAL | Status: DC
  Administered 2016-04-24 – 2016-05-01 (×14): 20 mg via ORAL
  Filled 2016-04-24 (×13): qty 1

## 2016-04-24 NOTE — Progress Notes (Signed)
HD#7 TOA POD#3 s/p CT guided percutaneous drain placement S/p 5 days Gent/Clinda/Flagyl now day 3 Invanz  HPI:. Pt notes marked improvement in abdominal pain and was able to sleep on side. Decreased IV pain med use. Pain in R buttocks worsening, pt unsure if it's from uncomfortable bed. Pt notes discomfort at drain site. Tolerated regular po yesterday and had BM. No emesis for almost 2 days. No fevers.   PE: Filed Vitals:   04/23/16 1724 04/23/16 2205 04/24/16 0528 04/24/16 1000  BP: 105/63 108/76 110/74 97/69  Pulse: 91 61 67 73  Temp: 98.5 F (36.9 C) 98.9 F (37.2 C) 97.7 F (36.5 C) 98 F (36.7 C)  TempSrc: Oral Oral Oral Oral  Resp: 16 18 16 16   Height:      Weight:      SpO2: 100% 96% 100% 100%   Gen: Alert, cooperative, no acute distress Abd: soft, no distension, no rebound, no guarding, pain with palpation in BLQ, L>R but mouch improved from prior, no tympany.  Percutaneous drain in place, minimal serosanguinous output.  GU: def today LE: no edema, NT, SCDs in place  UCx negative GC/CT neg/ HIV neg Gram stain from fluid: E. Coli.   CBC Latest Ref Rng 04/24/2016 04/23/2016 04/22/2016  WBC 4.0 - 10.5 K/uL 10.4 11.0(H) 12.4(H)  Hemoglobin 12.0 - 15.0 g/dL 11.2(L) 11.7(L) 12.0  Hematocrit 36.0 - 46.0 % 34.0(L) 34.8(L) 35.5(L)  Platelets 150 - 400 K/uL 370 372 396   CT 7/9: Diffusely thick-walled peripherally enhancing abscess noted superior to the uterus, measuring 6.1 x 3.1 x 1.9 cm. This appears contiguous with a complex left-sided tubo-ovarian abscess, which measures approximately 4.9 x 3.5 x 3.2 cm. Scattered air tracks about the pelvic abscess and tubo-ovarian abscess, with a prominent 4.7 cm pocket of air noted superior to the tubo-ovarian abscess. Diffuse surrounding soft tissue inflammation noted. 2. The pelvic abscess directly abuts the mid sigmoid colon, and there is question of a tract between the mid sigmoid colon and pelvic abscess on coronal images. The  amount of air within the abscesses is significantly more than expected for a tubo-ovarian abscess origin, and this raises concern for focal colonic perforation. This could have arisen either from focal sigmoid diverticulitis with abscess formation, or possibly erosion at the sigmoid colon from the tubo-ovarian abscess extending into the Pelvis.  CT 7/13: resolution of abscess with drain catheter, residual deep pelvic abscess present. Fistulous tract from abscess cavity to sigmoid.  CMP     Component Value Date/Time   NA 136 04/24/2016 0557   K 3.9 04/24/2016 0557   CL 104 04/24/2016 0557   CO2 27 04/24/2016 0557   GLUCOSE 108* 04/24/2016 0557   BUN <5* 04/24/2016 0557   CREATININE 0.78 04/24/2016 0557   CALCIUM 8.0* 04/24/2016 0557   PROT 5.1* 04/24/2016 0557   ALBUMIN 2.1* 04/24/2016 0557   AST 19 04/24/2016 0557   ALT 10* 04/24/2016 0557   ALKPHOS 71 04/24/2016 0557   BILITOT 0.5 04/24/2016 0557   GFRNONAA >60 04/24/2016 0557   GFRAA >60 04/24/2016 0557     A/P:30 yo G2P2, non-preg patient with acute pelvic pain due to TOA. Now with resolution of 1 of 2 abscess cavities and persistence of 2nd deep pelvic abscess. New confirmation of fistulous tract to sigmoid.   - Fistula. Will be managed by general surgery and pt will be transferred to general surgery service. Per Dr. Derrell LollingIngram, they will attempt to manage non-surgically at this time with drains,  antibiotics, liquid diet. Appreciate their expert management.  - PID/ TOA:  [name] 04/18/16. GC/CT/ cultures negative. Improvement in 1 abscess after CT guided percutaneous drain and plan to drain 2nd abscess with CT guidance. ID consulting on antibiotic selection.  Pt did 5 days  IV gent, clindamycin and flagyl, started on admission and switched on 7/11 to Invanz at the recommendation of ID. Clinically pt is improving with improvement in pain and resolution of leukocytosis. For typical treatment of TOA we would manage with  antibiotics and consider surgery if persistent pain or hydrosalpynx several months after infection resolved. If sepsis or worsening clinical course, surgery may be necessary and would likely be LSO. Pt aware.   - Pain, continue  Dilaudid IV, q 2 hrs for pain control. Will transition to oral meds when pt able to tolerate better po.   - Nausea and emesis, continue meds as needed.   - dispo, needs continued inpt admission for antibiotics, pain control. Transferring to general surgery service.  - SCD's, walk each shift   - Contraception. IUD removed. Alternative options d/w pt. Will plan Nexplanon as outpt. Avoid intrauterine contraception for at least 3 months.   Sheila Willis A. 04/24/2016 1:42 PM    For 7/14-04/19/15 Dr. Juliene Pina will be covering for this patient.

## 2016-04-24 NOTE — Progress Notes (Signed)
Subjective: Pt currently at [facility] for new drain placement.  I discussed findings.  She is still tender and anxious over everything, but stable on exam.    Objective: Vital signs in last 24 hours: Temp:  [97.7 F (36.5 C)-98.9 F (37.2 C)] 98 F (36.7 C) (07/13 1000) Pulse Rate:  [61-91] 73 (07/13 1000) Resp:  [16-18] 16 (07/13 1000) BP: (97-110)/(63-76) 97/69 mmHg (07/13 1000) SpO2:  [96 %-100 %] 100 % (07/13 1000) Last BM Date: 04/23/16 800 PO Urine 3200 Drain 11 BM x 2 Afebrile, VSS,  Labs OK WBC improved CT scan:  No fluid visualized in the region of the catheter tip. Catheter tip placement within a loop of sigmoid colon cannot be entirely excluded by imaging. Patient is scheduled for catheter injection. IR sinus fistula injection:  Stable drain catheter position. Fistula from the collapsed abscess cavity drain catheter site to the adjacent sigmoid. Retention loop of the drain catheter appears to contact the sigmoid wall. Intake/Output from previous day: 07/12 0701 - 07/13 0700 In: 805 [P.O.:800] Out: 3213 [Urine:3200; Drains:11; Stool:2] Intake/Output this shift: Total I/O In: -  Out: 850 [Urine:850]  General appearance: alert, cooperative and rather anxious, over new findings, but no physical distress. Resp: clear to auscultation bilaterally GI: soft, perhaps a little more tender than yesterday, + BS, No distenion or peritonitis.    Lab Results:   Recent Labs  04/23/16 0401 04/24/16 0557  WBC 11.0* 10.4  HGB 11.7* 11.2*  HCT 34.8* 34.0*  PLT 372 370    BMET  Recent Labs  04/22/16 0512 04/24/16 0557  NA 136 136  K 4.2 3.9  CL 102 104  CO2 25 27  GLUCOSE 94 108*  BUN <5* <5*  CREATININE 0.71 0.78  CALCIUM 8.1* 8.0*   PT/INR No results for input(s): LABPROT, INR in the last 72 hours.   Recent Labs Lab 04/19/16 0624 04/20/16 0633 04/21/16 0511 04/22/16 0512 04/24/16 0557  AST 11* 14* ALT 8* 7* 10* 10* 10*  ALKPHOS 89 77 77 66  71  BILITOT 0.7 0.7 0.5 0.6 0.5  PROT 5.9* 6.2* 5.8* 5.4* 5.1*  ALBUMIN 2.3* 2.2* 2.2* 2.2* 2.1*     Lipase     Component Value Date/Time   LIPASE 32 04/10/2016 1126     Studies/Results: Ct Pelvis W Contrast  04/24/2016  CLINICAL DATA:  Left lower quadrant drain placement. EXAM: CT PELVIS WITH CONTRAST TECHNIQUE: Multidetector CT imaging of the pelvis was performed using the standard protocol following the bolus administration of intravenous contrast. CONTRAST:  ISOVUE-300 IOPAMIDOL (ISOVUE-300) INJECTION 61% COMPARISON:  04/21/2016.  04/20/2016. FINDINGS: Fluid collections seen previously in the region of the catheter tip is no longer evident. Catheter tip appears coiled in very close proximity to the sigmoid colon and intraluminal tip placement within the colon cannot be excluded. There is a persistent complex 4.6 x 2.8 cm fluid collection in the left adnexal region, suspicious for residual abscess. Small amount of free fluid is evident in the cul-de-sac. Gas in the urinary bladder is presumably related to recent instrumentation. IMPRESSION: No fluid visualized in the region of the catheter tip. Catheter tip placement within a loop of sigmoid colon cannot be entirely excluded by imaging. Patient is scheduled for catheter injection. Electronically Signed   By: Kennith Center M.D.   On: 04/24/2016 13:07   Ir Sinus/fist Tube Chk-non Gi  04/24/2016  CLINICAL DATA:  Pelvic abscess, status post percutaneous drain 04/21/2016. Catheter output is  fecal contaminated. Assess for fistula. EXAM: SINUS TRACT INJECTION/FISTULOGRAM COMPARISON:  04/21/2016 FINDINGS: Catheter injection demonstrates a collapsed abscess cavity. There is a fistula from the collapsed abscess drain catheter site to the adjacent sigmoid. The retention loop of the drain catheter appears to contact the sigmoid wall at the site of the fistula. IMPRESSION: Stable drain catheter position. Fistula from the collapsed abscess cavity drain  catheter site to the adjacent sigmoid. Retention loop of the drain catheter appears to contact the sigmoid wall. Electronically Signed   By: Judie PetitM.  Shick M.D.   On: 04/24/2016 13:18    Medications: . sodium chloride   Intravenous Once  . ertapenem  1 g Intravenous Q24H  . famotidine  20 mg Oral BID  . feeding supplement  1 Container Oral TID BM  . 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   Plan:  [facility] and [facility] service, full liquids, new drain to be placed this afternoon.  Recheck CBC in AM, family ask about nutrition, and I have added a prealbumin to current labs.      LOS: 6 days    Jediah Horger 04/24/2016 [phone]

## 2016-04-24 NOTE — Progress Notes (Signed)
Patient ID: Sheila Willis, female   DOB: 05/12/1986, 30 y.o.   MRN: [id number]    Referring Physician(s): Darrold Span  Supervising Physician: Ruel Favors  Patient Status:  Inpatient  Chief Complaint:  Pelvic abscesses  Subjective: Pt still with persistent pelvic pain; just completed f/u CT/pelvic drain injection   Allergies: Latex  Medications: Prior to Admission medications   Medication Sig Start Date End Date Taking? Authorizing Provider  ondansetron (ZOFRAN ODT) 4 MG disintegrating tablet Take 1 tablet (4 mg total) by mouth every 8 (eight) hours as needed for nausea or vomiting. 04/10/16  Yes Barrett Henle, PA-C  oxyCODONE-acetaminophen (PERCOCET/ROXICET) 5-325 MG tablet Take 1 tablet by mouth every 2 (two) hours as needed for severe pain.   Yes Historical Provider, MD  oxyCODONE (ROXICODONE) 5 MG immediate release tablet Take 1 tablet (5 mg total) by mouth every 4 (four) hours as needed for severe pain. Patient not taking: Reported on 04/18/2016 04/10/16   Barrett Henle, PA-C     Vital Signs: BP 97/69 mmHg  Pulse 73  Temp(Src) 98 F (36.7 C) (Oral)  Resp 16  Ht 5\' 2"  (1.575 m)  Wt 134 lb (60.782 kg)  BMI 24.50 kg/m2  SpO2 100%  Physical Exam awake/alert; tearful; pelvic drain intact, insertion site ok,mild-mod tender, output previously minimal but now feculent; chest- CTA bilat; heart- RRR; LE without edema  Imaging: Ct Pelvis W Contrast  04/24/2016  CLINICAL DATA:  Left lower quadrant drain placement. EXAM: CT PELVIS WITH CONTRAST TECHNIQUE: Multidetector CT imaging of the pelvis was performed using the standard protocol following the bolus administration of intravenous contrast. CONTRAST:  ISOVUE-300 IOPAMIDOL (ISOVUE-300) INJECTION 61% COMPARISON:  04/21/2016.  04/20/2016. FINDINGS: Fluid collections seen previously in the region of the catheter tip is no longer evident. Catheter tip appears coiled in very close proximity to the sigmoid  colon and intraluminal tip placement within the colon cannot be excluded. There is a persistent complex 4.6 x 2.8 cm fluid collection in the left adnexal region, suspicious for residual abscess. Small amount of free fluid is evident in the cul-de-sac. Gas in the urinary bladder is presumably related to recent instrumentation. IMPRESSION: No fluid visualized in the region of the catheter tip. Catheter tip placement within a loop of sigmoid colon cannot be entirely excluded by imaging. Patient is scheduled for catheter injection. Electronically Signed   By: Kennith Center M.D.   On: 04/24/2016 13:07   Ct Abdomen Pelvis W Contrast  04/20/2016  CLINICAL DATA:  Acute onset of pelvic pain.  Initial encounter. EXAM: CT ABDOMEN AND PELVIS WITH CONTRAST TECHNIQUE: Multidetector CT imaging of the abdomen and pelvis was performed using the standard protocol following bolus administration of intravenous contrast. CONTRAST:  ISOVUE-300 IOPAMIDOL (ISOVUE-300) INJECTION 61% COMPARISON:  Pelvic ultrasound, and CT of the abdomen and pelvis performed 04/10/2016 FINDINGS: The visualized lung bases are clear. The liver and spleen are unremarkable in appearance. The gallbladder is within normal limits. The pancreas and adrenal glands are unremarkable. The kidneys are unremarkable in appearance. There is no evidence of hydronephrosis. No renal or ureteral stones are seen. No perinephric stranding is appreciated. The small bowel is unremarkable in appearance. The stomach is within normal limits. No acute vascular abnormalities are seen. There is a peripherally enhancing diffusely thick-walled abscess noted superior to the uterus, measuring approximately 6.1 x 3.1 x 1.9 cm, which appears contiguous with a complex left-sided tubo-ovarian abscess, which measures approximately 4.9 x 3.5 x 3.2 cm. Scattered air  is seen tracking about the pelvic abscess and tubo-ovarian abscess, and a prominent 4.7 cm pocket of air is noted superior to  the tubo-ovarian abscess. Diffuse surrounding soft tissue inflammation is noted. A fat plane is minimally preserved between the tubo-ovarian abscess and proximal sigmoid colon, but the pelvic abscess directly abuts the mid sigmoid colon, and there is question of a tract between the mid sigmoid colon and pelvic abscess on coronal images. The amount of air within the abscesses is significantly more than expected for a tubo-ovarian abscess, and this raises concern for focal colonic perforation. This could have arisen either from focal sigmoid diverticulitis formation, or possibly erosion at the sigmoid colon from the tubo-ovarian abscess, extending into the pelvis. The appendix is normal in caliber. There is no evidence of appendicitis. The remainder of the colon is grossly unremarkable in appearance. The sigmoid colon is mildly redundant. The bladder is mildly distended and grossly unremarkable in appearance. The uterus is grossly unremarkable, though an abscess is seen above the uterus. The right ovary is grossly unremarkable, aside from mild inflammation extending about the pelvis. No inguinal lymphadenopathy is seen. No acute osseous abnormalities are identified. IMPRESSION: 1. Diffusely thick-walled peripherally enhancing abscess noted superior to the uterus, measuring 6.1 x 3.1 x 1.9 cm. This appears contiguous with a complex left-sided tubo-ovarian abscess, which measures approximately 4.9 x 3.5 x 3.2 cm. Scattered air tracks about the pelvic abscess and tubo-ovarian abscess, with a prominent 4.7 cm pocket of air noted superior to the tubo-ovarian abscess. Diffuse surrounding soft tissue inflammation noted. 2. The pelvic abscess directly abuts the mid sigmoid colon, and there is question of a tract between the mid sigmoid colon and pelvic abscess on coronal images. The amount of air within the abscesses is significantly more than expected for a tubo-ovarian abscess origin, and this raises concern for focal  colonic perforation. This could have arisen either from focal sigmoid diverticulitis with abscess formation, or possibly erosion at the sigmoid colon from the tubo-ovarian abscess extending into the pelvis. 3. If it is deemed clinically necessary to confirm the presence of a tract between the mid sigmoid colon and pelvic abscess, repeat pelvic CT with rectal contrast could be considered for further evaluation. Critical Value/emergent results were called by telephone at the time of interpretation on 04/20/2016 at 8:03 pm to Dr. Noland FordyceKELLY FOGLEMAN, who verbally acknowledged these results. Electronically Signed   By: Roanna RaiderJeffery  Chang M.D.   On: 04/20/2016 20:17   Ct Image Guided Drainage By Percutaneous Catheter  04/21/2016  CLINICAL DATA:  Progressive left tubo-ovarian abscess with gas and fluid and multiple loculations. EXAM: CT GUIDED DRAINAGE OF PELVIC ABSCESS ANESTHESIA/SEDATION: Intravenous Fentanyl and Versed were administered as conscious sedation during continuous monitoring of the patient's level of consciousness and physiological / cardiorespiratory status by the radiology RN, with a total moderate sedation time of 31 minutes. PROCEDURE: The procedure, risks, benefits, and alternatives were explained to the patient. Questions regarding the procedure were encouraged and answered. The patient understands and consents to the procedure. Axial scans through the pelvis were obtained. The collection was localized and an appropriate skin entry site was determined and marked. The operative field was prepped with chlorhexidinein a sterile fashion, and a sterile drape was applied covering the operative field. A sterile gown and sterile gloves were used for the procedure. Local anesthesia was provided with 1% Lidocaine. Under intermittent CT guidance, a 19 gauge percutaneous entry needle was advanced into the largest superficial pelvic gas and fluid collection  just superior to the uterine fundus. A small amount of scratch  the gas could be aspirated. Amplatz guidewire advanced easily within the collection, its position confirmed on CT. Tract dilated to facilitate placement of a 12 French pigtail catheter, formed centrally within the collection. Approximately 7 mL blood bloody aspirate returned, sent for Gram stain and culture. Catheter was flushed, secured externally with 0 Prolene suture and StatLock, and placed to gravity drain bag. The patient tolerated the procedure well. COMPLICATIONS: None immediate FINDINGS: Complex left adnexal and pelvic gas and fluid collection, similar to that seen on previous day's exam. Percutaneous drain catheter placed as above. 7 mL bloody aspirate sent for Gram stain and culture. IMPRESSION: 1. Technically successful pelvic abscess drain catheter placement under CT guidance. Electronically Signed   By: Corlis Leak M.D.   On: 04/21/2016 14:04    Labs:  CBC:  Recent Labs  04/21/16 0511 04/22/16 0512 04/23/16 0401 04/24/16 0557  WBC 10.8* 12.4* 11.0* 10.4  HGB 12.0 12.0 11.7* 11.2*  HCT 35.5* 35.5* 34.8* 34.0*  PLT 393 396 372 370    COAGS:  Recent Labs  04/21/16 0839  INR 1.35    BMP:  Recent Labs  04/20/16 0633 04/21/16 0511 04/22/16 0512 04/24/16 0557  NA 137 134* 136 136  K 3.1* 3.9 4.2 3.9  CL 101 99* 102 104  CO2 GLUCOSE 100* 91 94 108*  BUN 6 5* <5* <5*  CALCIUM 8.1* 8.1* 8.1* 8.0*  CREATININE 0.75 0.80 0.71 0.78  GFRNONAA >60 >60 >60 >60  GFRAA >60 >60 >60 >60    LIVER FUNCTION TESTS:  Recent Labs  04/20/16 0633 04/21/16 0511 04/22/16 0512 04/24/16 0557  BILITOT 0.7 0.5 0.6 0.5  AST 14* ALT 7* 10* 10* 10*  ALKPHOS 77 77 66 71  PROT 6.2* 5.8* 5.4* 5.1*  ALBUMIN 2.2* 2.2* 2.2* 2.1*    Assessment and Plan: S/p drainage of complex left adnexal gas/fluid collection/TOA 7/10; AF; WBC nl; HGB 11.2, creat nl; drain fluid cx's- e coli; f/u CT today reveals no fluid in the region of the catheter tip; there is persistent  complex fluid collection in the left adnexal region suspicious for residual abscess. 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 Above findings discussed with Dr. Derrell Lolling. Plan now is for additional CT-guided  drainage of the residual abscess/ collection noted. Details/risks of above procedure, including but not limited to, internal bleeding, infection, injury to adjacent structures, need for prolonged drainage and possible surgery discussed with patient with her understanding and consent.   Electronically Signed: D. Jeananne Rama 04/24/2016, 1:19 PM   I spent a total of 20 minutes at the the patient's bedside AND on the patient's hospital floor or unit, greater than 50% of which was counseling/coordinating care for CT-guided drainage of pelvic abscess

## 2016-04-24 NOTE — Progress Notes (Addendum)
General surgery:    I discussed today's imaging studies with Dr. Ruel Favorsrevor Shick in interventional radiology.  He states that there is no primary disease of the left colon, but rectal contrast was not performed.  The upper abscess appears adequately drained by the pigtail catheter but there is a separate, discontinuous deep pelvic abscess that needs to be drained.  I asked him to schedule drainage of that abscess today or tomorrow.   I asked Dr. Mariel CraftSchick's opinion  as to the pathophysiology of her pelvic abscesses, and he said that he believed this was a tubo-ovarian abscess.  Injection study of the pigtail catheter shows that it immediately enters the sigmoid colon.  The pigtail catheter is immediately adjacent to the colon.  I cannot tell if the tip of the catheter is actually in the lumen of the colon or not. I asked Dr. Denny LevySchick to review this.  Colon shows no diverticula or primary disease process.  My opinion is that this is very unusual.   Neither I nor Dr. Ernestina PennaFogleman recall a case of tubo-ovarian abscess eroding into the colon. , and there does not appear to be any primary disease process of the colon.   Plan: We agreed that [facility] management of her drains by general surgery..  Dr. Ernestina PennaFogleman will continue to follow for advice regarding management of her tubo-ovarian abscess. She will be placed on a full liquid diet.   Continue antibiotics as recommended by infectious disease.   Proceed with scheduling and drainage of deep pelvic abscess. 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   Angelia MouldHaywood M. Derrell LollingIngram, M.D., [facility] General and Minimally invasive Surgery Breast and Colorectal Surgery Office:   [phone]

## 2016-04-24 NOTE — Procedures (Signed)
Successful CT LLQ PELVIC ABSCESS DRAIN INSERTION NO COMP STABLE 10CC DEBRIS FLD ASPIRATED FULL REPORT IN PACS

## 2016-04-24 NOTE — Progress Notes (Signed)
INFECTIOUS DISEASE PROGRESS NOTE  ID: Sheila Willis is a 30 y.o. female with  Active Problems:   Pelvic pain in female   TOA (tubo-ovarian abscess)  Subjective: C/o abd discomfort after drain placement Had BM this AM  Abtx:  Anti-infectives    Start     Dose/Rate Route Frequency Ordered Stop   04/22/16 1800  ertapenem (INVANZ) 1 g in sodium chloride 0.9 % 50 mL IVPB     1 g 100 mL/hr over 30 Minutes Intravenous Every 24 hours 04/22/16 1707     04/22/16 1700  ertapenem (INVANZ) injection 1 g  Status:  Discontinued     1 g Intramuscular Every 24 hours 04/22/16 1653 04/22/16 1707   04/19/16 0200  clindamycin (CLEOCIN) IVPB 900 mg  Status:  Discontinued     900 mg 100 mL/hr over 30 Minutes Intravenous 2 times per day 04/18/16 1607 04/22/16 1653   04/18/16 1800  gentamicin (GARAMYCIN) 300 mg, clindamycin (CLEOCIN) 900 mg in dextrose 5 % 100 mL IVPB  Status:  Discontinued     227 mL/hr over 30 Minutes Intravenous Every 24 hours 04/18/16 1637 04/22/16 1653   04/18/16 1700  metroNIDAZOLE (FLAGYL) IVPB 500 mg  Status:  Discontinued     500 mg 100 mL/hr over 60 Minutes Intravenous Every 6 hours 04/18/16 1607 04/22/16 1653   04/18/16 1615  gentamicin (GARAMYCIN) 5 mg/kg in dextrose 5 % 100 mL IVPB  Status:  Discontinued     5 mg/kg (Adjusted) 100 mL/hr over 60 Minutes Intravenous Every 24 hours 04/18/16 1607 04/18/16 1635      Medications:  Scheduled: . sodium chloride   Intravenous Once  . ertapenem  1 g Intravenous Q24H  . famotidine  20 mg Oral BID  . feeding supplement  1 Container Oral TID BM  . [START ON 04/25/2016] heparin subcutaneous  5,000 Units Subcutaneous Q8H  . polyethylene glycol  17 g Oral Daily    Objective: Vital signs in last 24 hours: Temp:  [97.7 F (36.5 C)-98.9 F (37.2 C)] 98 F (36.7 C) (07/13 1000) Pulse Rate:  [61-80] 74 (07/13 1717) Resp:  [14-22] 15 (07/13 1717) BP: (96-110)/(54-79) 101/54 mmHg (07/13 1717) SpO2:  [96 %-100 %] 98 % (07/13  1717)   General appearance: alert, cooperative and mild distress Resp: clear to auscultation bilaterally Cardio: regular rate and rhythm GI: abnormal findings:  distended and hypoactive bowel sounds  Lab Results  Recent Labs  04/22/16 0512 04/23/16 0401 04/24/16 0557  WBC 12.4* 11.0* 10.4  HGB 12.0 11.7* 11.2*  HCT 35.5* 34.8* 34.0*  NA 136  --  136  K 4.2  --  3.9  CL 102  --  104  CO2 25  --  27  BUN <5*  --  <5*  CREATININE 0.71  --  0.78   Liver Panel  Recent Labs  04/22/16 0512 04/24/16 0557  PROT 5.4* 5.1*  ALBUMIN 2.2* 2.1*  AST 25 19  ALT 10* 10*  ALKPHOS 66 71  BILITOT 0.6 0.5   Sedimentation Rate No results for input(s): ESRSEDRATE in the last 72 hours. C-Reactive Protein No results for input(s): CRP in the last 72 hours.  Microbiology: Recent Results (from the past 240 hour(s))  Urine culture     Status: None   Collection Time: 04/18/16  8:00 PM  Result Value Ref Range Status   Specimen Description URINE, CLEAN CATCH  Final   Special Requests NONE  Final   Culture NO GROWTH  Performed at [facility]   Final   Report Status 04/20/2016 FINAL  Final  Aerobic/Anaerobic Culture (surgical/deep wound)     Status: None (Preliminary result)   Collection Time: 04/21/16 10:59 AM  Result Value Ref Range Status   Specimen Description   Final    ABSCESS PELVIS DRAINAGE Performed at [facility]    Special Requests NONE  Final   Gram Stain   Final    ABUNDANT WBC PRESENT, PREDOMINANTLY PMN MODERATE GRAM POSITIVE RODS FEW GRAM NEGATIVE RODS NO SQUAMOUS EPITHELIAL CELLS SEEN Performed at [facility]    Culture   Final    ABUNDANT ESCHERICHIA COLI NO ANAEROBES ISOLATED; CULTURE IN PROGRESS FOR 5 DAYS    Report Status PENDING  Incomplete   Organism ID, Bacteria ESCHERICHIA COLI  Final      Susceptibility   Escherichia coli - MIC*    AMPICILLIN >=32 RESISTANT Resistant     CEFAZOLIN <=4 SENSITIVE Sensitive      CEFEPIME <=1 SENSITIVE Sensitive     CEFTAZIDIME <=1 SENSITIVE Sensitive     CEFTRIAXONE <=1 SENSITIVE Sensitive     CIPROFLOXACIN <=0.25 SENSITIVE Sensitive     GENTAMICIN >=16 RESISTANT Resistant     IMIPENEM <=0.25 SENSITIVE Sensitive     TRIMETH/SULFA >=320 RESISTANT Resistant     AMPICILLIN/SULBACTAM 16 INTERMEDIATE Intermediate     PIP/TAZO <=4 SENSITIVE Sensitive     * ABUNDANT ESCHERICHIA COLI    Studies/Results: Ct Pelvis W Contrast  04/24/2016  CLINICAL DATA:  Left lower quadrant drain placement. EXAM: CT PELVIS WITH CONTRAST TECHNIQUE: Multidetector CT imaging of the pelvis was performed using the standard protocol following the bolus administration of intravenous contrast. CONTRAST:  ISOVUE-300 IOPAMIDOL (ISOVUE-300) INJECTION 61% COMPARISON:  04/21/2016.  04/20/2016. FINDINGS: Fluid collections seen previously in the region of the catheter tip is no longer evident. Catheter tip appears coiled in very close proximity to the sigmoid colon and intraluminal tip placement within the colon cannot be excluded. There is a persistent complex 4.6 x 2.8 cm fluid collection in the left adnexal region, suspicious for residual abscess. Small amount of free fluid is evident in the cul-de-sac. Gas in the urinary bladder is presumably related to recent instrumentation. IMPRESSION: No fluid visualized in the region of the catheter tip. Catheter tip placement within a loop of sigmoid colon cannot be entirely excluded by imaging. Patient is scheduled for catheter injection. Electronically Signed   By: Kennith Center M.D.   On: 04/24/2016 13:07   Ir Sinus/fist Tube Chk-non Gi  04/24/2016  CLINICAL DATA:  Pelvic abscess, status post percutaneous drain 04/21/2016. Catheter output is fecal contaminated. Assess for fistula. EXAM: SINUS TRACT INJECTION/FISTULOGRAM COMPARISON:  04/21/2016 FINDINGS: Catheter injection demonstrates a collapsed abscess cavity. There is a fistula from the collapsed abscess drain  catheter site to the adjacent sigmoid. The retention loop of the drain catheter appears to contact the sigmoid wall at the site of the fistula. IMPRESSION: Stable drain catheter position. Fistula from the collapsed abscess cavity drain catheter site to the adjacent sigmoid. Retention loop of the drain catheter appears to contact the sigmoid wall. Electronically Signed   By: Judie Petit.  Shick M.D.   On: 04/24/2016 13:18     Assessment/Plan: TOA  CT shows fistula She is now on CCS service  Continue invanz for now, consider change to ceftriaxone when draining well  I spoke at length (~ 30 min) with pt, her family about possible outcomes, length of recovery, length  of atbx.  Will f/u in AM.  Total days of antibiotics: 6 invanz         Johny Sax Infectious Diseases (pager) [phone] [email or web address] 04/24/2016, 5:35 PM  LOS: 6 days

## 2016-04-24 NOTE — Progress Notes (Signed)
The patient is receiving Famotidine by the intravenous route.  Based on criteria approved by the Pharmacy and Therapeutics Committee and the Medical Executive Committee, the medication is being converted to the equivalent oral dose form.  These criteria include: -No Active GI bleeding -Able to tolerate diet of full liquids (or better) or tube feeding -Able to tolerate other medications by the oral or enteral route  If you have any questions about this conversion, please contact the Pharmacy Department (ext [phone]).  Thank you.  

## 2016-04-24 NOTE — Progress Notes (Signed)
Spoke to SherwoodDana, Consulting civil engineercharge RN regarding getting [facility] [place]  She stated that the transfer could only be made once the patient was physically transferred to their unit, but that a bed was pended for the patient.  Pt with lab order for pre-albumin as an add on to this morning's labs.  [facility] said that the blood specimen collected earlier this morning was too old to run the test.  Desma MaximAlerted Dana that the lab would need [facility].

## 2016-04-25 DIAGNOSIS — K632 Fistula of intestine: Secondary | ICD-10-CM

## 2016-04-25 MED ORDER — SACCHAROMYCES BOULARDII 250 MG PO CAPS
250.0000 mg | ORAL_CAPSULE | Freq: Two times a day (BID) | ORAL | Status: DC
  Administered 2016-04-25 – 2016-05-01 (×13): 250 mg via ORAL
  Filled 2016-04-25 (×13): qty 1

## 2016-04-25 NOTE — Progress Notes (Signed)
Subjective: She is sore, but otherwise OK, wanted more food.  Drains are both having clear fluid, the right drain looks mostly like irrigation, new left drain is clear serosanguinous stuff.  Objective: Vital signs in last 24 hours: Temp:  [98 F (36.7 C)-98.7 F (37.1 C)] 98.1 F (36.7 C) (07/14 0637) Pulse Rate:  [70-82] 74 (07/14 0637) Resp:  [14-22] 15 (07/14 0637) BP: (96-110)/(54-79) 101/62 mmHg (07/14 0637) SpO2:  [98 %-100 %] 100 % (07/14 0637) Last BM Date: 04/23/16 360 PO 850 urine  Afebrile, VSS Labs OK yesterday Second drain placed 04/24/16. Intake/Output from previous day: 07/13 0701 - 07/14 0700 In: 1520 [P.O.:360; I.V.:1100; IV Piggyback:50] Out: 850 [Urine:850] Intake/Output this shift:    General appearance: alert, cooperative and no distress Resp: clear to auscultation bilaterally GI: soft, sore, Left drain is new, right drain is the older drain, drainage as above.  No distension , normal tenderness around drains .   Lab Results:   Recent Labs  04/23/16 0401 04/24/16 0557  WBC 11.0* 10.4  HGB 11.7* 11.2*  HCT 34.8* 34.0*  PLT 372 370    BMET  Recent Labs  04/24/16 0557  NA 136  K 3.9  CL 104  CO2 27  GLUCOSE 108*  BUN <5*  CREATININE 0.78  CALCIUM 8.0*   PT/INR No results for input(s): LABPROT, INR in the last 72 hours.   Recent Labs Lab 04/19/16 0624 04/20/16 0633 04/21/16 0511 04/22/16 0512 04/24/16 0557  AST 11* 14* 28 25 19   ALT 8* 7* 10* 10* 10*  ALKPHOS 89 77 77 66 71  BILITOT 0.7 0.7 0.5 0.6 0.5  PROT 5.9* 6.2* 5.8* 5.4* 5.1*  ALBUMIN 2.3* 2.2* 2.2* 2.2* 2.1*     Lipase     Component Value Date/Time   LIPASE 32 04/10/2016 1126     Studies/Results: Ct Pelvis W Contrast  04/24/2016  CLINICAL DATA:  Left lower quadrant drain placement. EXAM: CT PELVIS WITH CONTRAST TECHNIQUE: Multidetector CT imaging of the pelvis was performed using the standard protocol following the bolus administration of intravenous  contrast. CONTRAST:  100mL ISOVUE-300 IOPAMIDOL (ISOVUE-300) INJECTION 61% COMPARISON:  04/21/2016.  04/20/2016. FINDINGS: Fluid collections seen previously in the region of the catheter tip is no longer evident. Catheter tip appears coiled in very close proximity to the sigmoid colon and intraluminal tip placement within the colon cannot be excluded. There is a persistent complex 4.6 x 2.8 cm fluid collection in the left adnexal region, suspicious for residual abscess. Small amount of free fluid is evident in the cul-de-sac. Gas in the urinary bladder is presumably related to recent instrumentation. IMPRESSION: No fluid visualized in the region of the catheter tip. Catheter tip placement within a loop of sigmoid colon cannot be entirely excluded by imaging. Patient is scheduled for catheter injection. Electronically Signed   By: Kennith CenterEric  Mansell M.D.   On: 04/24/2016 13:07   Ir Sinus/fist Tube Chk-non Gi  04/24/2016  CLINICAL DATA:  Pelvic abscess, status post percutaneous drain 04/21/2016. Catheter output is fecal contaminated. Assess for fistula. EXAM: SINUS TRACT INJECTION/FISTULOGRAM COMPARISON:  04/21/2016 FINDINGS: Catheter injection demonstrates a collapsed abscess cavity. There is a fistula from the collapsed abscess drain catheter site to the adjacent sigmoid. The retention loop of the drain catheter appears to contact the sigmoid wall at the site of the fistula. IMPRESSION: Stable drain catheter position. Fistula from the collapsed abscess cavity drain catheter site to the adjacent sigmoid. Retention loop of the drain catheter appears to  contact the sigmoid wall. Electronically Signed   By: Judie Petit.  Shick M.D.   On: 04/24/2016 13:18    Medications: . sodium chloride   Intravenous Once  . ertapenem  1 g Intravenous Q24H  . famotidine  20 mg Oral BID  . feeding supplement  1 Container Oral TID BM  . heparin subcutaneous  5,000 Units Subcutaneous Q8H  . 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    Culture: ABUNDANT ESCHERICHIA COLI  NO ANAEROBES ISOLATED; CULTURE IN PROGRESS FOR 5 DAYS    PLAN: Continue drains, antibiotics, bowel rest.  Appreciate Dr. Moshe Cipro help   LOS: 7 days    Ayo Guarino 04/25/2016 [phone]

## 2016-04-25 NOTE — Progress Notes (Signed)
INFECTIOUS DISEASE PROGRESS NOTE  ID: Sheila Willis is a 30 y.o. female with  Active Problems:   Pelvic pain in female   TOA (tubo-ovarian abscess)  Subjective: C/o pain  Abtx:  Anti-infectives    Start     Dose/Rate Route Frequency Ordered Stop   04/22/16 1800  ertapenem (INVANZ) 1 g in sodium chloride 0.9 % 50 mL IVPB     1 g 100 mL/hr over 30 Minutes Intravenous Every 24 hours 04/22/16 1707     04/22/16 1700  ertapenem (INVANZ) injection 1 g  Status:  Discontinued     1 g Intramuscular Every 24 hours 04/22/16 1653 04/22/16 1707   04/19/16 0200  clindamycin (CLEOCIN) IVPB 900 mg  Status:  Discontinued     900 mg 100 mL/hr over 30 Minutes Intravenous 2 times per day 04/18/16 1607 04/22/16 1653   04/18/16 1800  gentamicin (GARAMYCIN) 300 mg, clindamycin (CLEOCIN) 900 mg in dextrose 5 % 100 mL IVPB  Status:  Discontinued     227 mL/hr over 30 Minutes Intravenous Every 24 hours 04/18/16 1637 04/22/16 1653   04/18/16 1700  metroNIDAZOLE (FLAGYL) IVPB 500 mg  Status:  Discontinued     500 mg 100 mL/hr over 60 Minutes Intravenous Every 6 hours 04/18/16 1607 04/22/16 1653   04/18/16 1615  gentamicin (GARAMYCIN) 5 mg/kg in dextrose 5 % 100 mL IVPB  Status:  Discontinued     5 mg/kg (Adjusted) 100 mL/hr over 60 Minutes Intravenous Every 24 hours 04/18/16 1607 04/18/16 1635      Medications:  Scheduled: . sodium chloride   Intravenous Once  . ertapenem  1 g Intravenous Q24H  . famotidine  20 mg Oral BID  . feeding supplement  1 Container Oral TID BM  . heparin subcutaneous  5,000 Units Subcutaneous Q8H  . polyethylene glycol  17 g Oral Daily  . saccharomyces boulardii  250 mg Oral BID    Objective: Vital signs in last 24 hours: Temp:  [98.1 F (36.7 C)-98.7 F (37.1 C)] 98.1 F (36.7 C) (07/14 0951) Pulse Rate:  [70-82] 78 (07/14 0951) Resp:  [14-22] 16 (07/14 0951) BP: (96-110)/(54-79) 110/74 mmHg (07/14 0951) SpO2:  [98 %-100 %] 99 % (07/14 0951)   General  appearance: alert, cooperative and no distress Resp: clear to auscultation bilaterally Cardio: regular rate and rhythm GI: normal findings: bowel sounds normal and soft, non-tender  Lab Results  Recent Labs  04/23/16 0401 04/24/16 0557  WBC 11.0* 10.4  HGB 11.7* 11.2*  HCT 34.8* 34.0*  NA  --  136  K  --  3.9  CL  --  104  CO2  --  27  BUN  --  <5*  CREATININE  --  0.78   Liver Panel  Recent Labs  04/24/16 0557  PROT 5.1*  ALBUMIN 2.1*  AST 19  ALT 10*  ALKPHOS 71  BILITOT 0.5   Sedimentation Rate No results for input(s): ESRSEDRATE in the last 72 hours. C-Reactive Protein No results for input(s): CRP in the last 72 hours.  Microbiology: Recent Results (from the past 240 hour(s))  Urine culture     Status: None   Collection Time: 04/18/16  8:00 PM  Result Value Ref Range Status   Specimen Description URINE, CLEAN CATCH  Final   Special Requests NONE  Final   Culture NO GROWTH [facility]   Final   Report Status 04/20/2016 FINAL  Final  Aerobic/Anaerobic Culture (surgical/deep wound)  Status: None (Preliminary result)   Collection Time: 04/21/16 10:59 AM  Result Value Ref Range Status   Specimen Description   Final    ABSCESS PELVIS DRAINAGE Performed at [facility]    Special Requests NONE  Final   Gram Stain   Final    ABUNDANT WBC PRESENT, PREDOMINANTLY PMN MODERATE GRAM POSITIVE RODS FEW GRAM NEGATIVE RODS NO SQUAMOUS EPITHELIAL CELLS SEEN Performed at [facility]    Culture   Final    ABUNDANT ESCHERICHIA COLI NO ANAEROBES ISOLATED; CULTURE IN PROGRESS FOR 5 DAYS    Report Status PENDING  Incomplete   Organism ID, Bacteria ESCHERICHIA COLI  Final      Susceptibility   Escherichia coli - MIC*    AMPICILLIN >=32 RESISTANT Resistant     CEFAZOLIN <=4 SENSITIVE Sensitive     CEFEPIME <=1 SENSITIVE Sensitive     CEFTAZIDIME <=1 SENSITIVE Sensitive     CEFTRIAXONE <=1 SENSITIVE Sensitive      CIPROFLOXACIN <=0.25 SENSITIVE Sensitive     GENTAMICIN >=16 RESISTANT Resistant     IMIPENEM <=0.25 SENSITIVE Sensitive     TRIMETH/SULFA >=320 RESISTANT Resistant     AMPICILLIN/SULBACTAM 16 INTERMEDIATE Intermediate     PIP/TAZO <=4 SENSITIVE Sensitive     * ABUNDANT ESCHERICHIA COLI  Aerobic/Anaerobic Culture (surgical/deep wound)     Status: None (Preliminary result)   Collection Time: 04/24/16  5:35 PM  Result Value Ref Range Status   Specimen Description ABSCESS LEFT PELVIC  Final   Special Requests Normal  Final   Gram Stain   Final    ABUNDANT WBC PRESENT,BOTH PMN AND MONONUCLEAR NO ORGANISMS SEEN Performed at [facility]    Culture PENDING  Incomplete   Report Status PENDING  Incomplete    Studies/Results: Ct Pelvis W Contrast  04/24/2016  CLINICAL DATA:  Left lower quadrant drain placement. EXAM: CT PELVIS WITH CONTRAST TECHNIQUE: Multidetector CT imaging of the pelvis was performed using the standard protocol following the bolus administration of intravenous contrast. CONTRAST:  ISOVUE-300 IOPAMIDOL (ISOVUE-300) INJECTION 61% COMPARISON:  04/21/2016.  04/20/2016. FINDINGS: Fluid collections seen previously in the region of the catheter tip is no longer evident. Catheter tip appears coiled in very close proximity to the sigmoid colon and intraluminal tip placement within the colon cannot be excluded. There is a persistent complex 4.6 x 2.8 cm fluid collection in the left adnexal region, suspicious for residual abscess. Small amount of free fluid is evident in the cul-de-sac. Gas in the urinary bladder is presumably related to recent instrumentation. IMPRESSION: No fluid visualized in the region of the catheter tip. Catheter tip placement within a loop of sigmoid colon cannot be entirely excluded by imaging. Patient is scheduled for catheter injection. Electronically Signed   By: Kennith Center M.D.   On: 04/24/2016 13:07   Ir Sinus/fist Tube Chk-non Gi  04/24/2016   CLINICAL DATA:  Pelvic abscess, status post percutaneous drain 04/21/2016. Catheter output is fecal contaminated. Assess for fistula. EXAM: SINUS TRACT INJECTION/FISTULOGRAM COMPARISON:  04/21/2016 FINDINGS: Catheter injection demonstrates a collapsed abscess cavity. There is a fistula from the collapsed abscess drain catheter site to the adjacent sigmoid. The retention loop of the drain catheter appears to contact the sigmoid wall at the site of the fistula. IMPRESSION: Stable drain catheter position. Fistula from the collapsed abscess cavity drain catheter site to the adjacent sigmoid. Retention loop of the drain catheter appears to contact the sigmoid wall. Electronically Signed   By:  M.  Shick M.D.   On: 04/24/2016 13:18   Ct Image Guided Drainage By Percutaneous Catheter  04/25/2016  INDICATION: Residual left pelvic tubo-ovarian abscess. EXAM: CT GUIDED DRAINAGE OF LEFT PELVIC ABSCESS MEDICATIONS: The patient is currently admitted to the hospital and receiving intravenous antibiotics. The antibiotics were administered within an appropriate time frame prior to the initiation of the procedure. ANESTHESIA/SEDATION: 2.0 mg IV Versed 100 mcg IV Fentanyl Moderate Sedation Time:  17 MINUTES The patient was continuously monitored during the procedure by the interventional radiology nurse under my direct supervision. COMPLICATIONS: None immediate. TECHNIQUE: Informed written consent was obtained from the patient after a thorough discussion of the procedural risks, benefits and alternatives. All questions were addressed. Maximal Sterile Barrier Technique was utilized including caps, mask, sterile gowns, sterile gloves, sterile drape, hand hygiene and skin antiseptic. A timeout was performed prior to the initiation of the procedure. PROCEDURE: The anterior left lower quadrant was prepped with ChloraPrep in a sterile fashion, and a sterile drape was applied covering the operative field. A sterile gown and sterile gloves  were used for the procedure. Local anesthesia was provided with 1% Lidocaine. No previous imaging reviewed. Patient positioned supine. Noncontrast localization CT performed through the pelvis. The undrained left pelvic complex loculated fluid collection was localized compatible with a tubo-ovarian abscess. Under sterile conditions and local anesthesia, an 18 gauge 10 cm access needle was advanced from an anterior left lower quadrant oblique approach. Needle position confirmed in the complex fluid collection. Syringe aspiration yielded serosanguineous fluid with exudative debris. Guidewire inserted followed by tract dilatation to insert a 12 [place] drain. Drain catheter position confirmed with CT. This was secured with a Prolene suture and connected to external gravity drainage. Sample sent for Gram stain and culture. Sterile dressing applied. No immediate complication FINDINGS: Imaging confirms needle placement in the left pelvic abscess for drain insertion IMPRESSION: Successful CT-guided left pelvic abscess drain insertion Electronically Signed   By: Judie Petit.  Shick M.D.   On: 04/25/2016 09:03     Assessment/Plan: TOA Fistula  Drains have put out 5/5 Will change her to ceftriaxone when she has repeat CT, if it shows improvement.   Dr Daiva Eves available as needed over w/e.   Total days of antibiotics: 7 (invanz)         Johny Sax Infectious Diseases (pager) [phone] [email or web address] 04/25/2016, 11:24 AM  LOS: 7 days

## 2016-04-25 NOTE — Progress Notes (Signed)
Nutrition Brief Note RD consulted for pt with sigmoid fistula, new pelvic drain  Wt Readings from Last 15 Encounters:  04/18/16 134 lb (60.782 kg)  09/27/14 170 lb (77.111 kg)  11/13/12 210 lb (95.255 kg)  11/08/12 217 lb (98.431 kg)  09/30/12 205 lb 1.6 oz (93.033 kg)   Pt denies weight loss, claims 134# to be normal wt. PO intake good, consumed half of tray today, appetite still recovering post surgery but patient consumed all of jello and ice cream. Denies nausea/vomiting Denies chewing/swallowing problems  Body mass index is 24.5 kg/(m^2). Patient meets criteria for normal based on current BMI.   Current diet order is full liquid, patient is consuming approximately 50-100% of meals at this time. Labs and medications reviewed.   No nutrition interventions warranted at this time. If nutrition issues arise, please consult RD.   Dionne AnoWilliam M. Jaequan Propes, MS, RD LDN Inpatient Clinical Dietitian Pager [phone]

## 2016-04-25 NOTE — Progress Notes (Signed)
Patient ID: Sheila Willis, female   DOB: [birth year], 30 y.o.   MRN: [id number]    Referring Physician(s): Fogleman,K/Ingram,H  Supervising Physician: Gilmer MorWagner, Jaime  Patient Status:  Inpatient  Chief Complaint:  Pelvic abscesses  Subjective:  Pt feeling a little better this am; still sore as expected at pelvic drain sites; denies N/V; no BM; has ambulated  Allergies: Latex  Medications: Prior to Admission medications   Medication Sig Start Date End Date Taking? Authorizing Provider  ondansetron (ZOFRAN ODT) 4 MG disintegrating tablet Take 1 tablet (4 mg total) by mouth every 8 (eight) hours as needed for nausea or vomiting. 04/10/16  Yes Barrett HenleNicole Elizabeth Nadeau, PA-C  oxyCODONE-acetaminophen (PERCOCET/ROXICET) 5-325 MG tablet Take 1 tablet by mouth every 2 (two) hours as needed for severe pain.   Yes Historical Provider, MD  oxyCODONE (ROXICODONE) 5 MG immediate release tablet Take 1 tablet (5 mg total) by mouth every 4 (four) hours as needed for severe pain. Patient not taking: Reported on 04/18/2016 04/10/16   Barrett HenleNicole Elizabeth Nadeau, PA-C     Vital Signs: BP 110/74 mmHg  Pulse 78  Temp(Src) 98.1 F (36.7 C) (Oral)  Resp 16  Ht 5\' 2"  (1.575 m)  Wt 134 lb (60.782 kg)  BMI 24.50 kg/m2  SpO2 99%  Physical Exam rt pelvic drain intact, output small amt air/liquid stool; left pelvic drain with small amt bloody fluid ; insertion sites ok, mild- mod tender; cx's- left pelvic drain pend; right pelvic drain - e coli  Imaging: Ct Pelvis W Contrast  04/24/2016  CLINICAL DATA:  Left lower quadrant drain placement. EXAM: CT PELVIS WITH CONTRAST TECHNIQUE: Multidetector CT imaging of the pelvis was performed using the standard protocol following the bolus administration of intravenous contrast. CONTRAST:  100mL ISOVUE-300 IOPAMIDOL (ISOVUE-300) INJECTION 61% COMPARISON:  04/21/2016.  04/20/2016. FINDINGS: Fluid collections seen previously in the region of the catheter tip is no longer  evident. Catheter tip appears coiled in very close proximity to the sigmoid colon and intraluminal tip placement within the colon cannot be excluded. There is a persistent complex 4.6 x 2.8 cm fluid collection in the left adnexal region, suspicious for residual abscess. Small amount of free fluid is evident in the cul-de-sac. Gas in the urinary bladder is presumably related to recent instrumentation. IMPRESSION: No fluid visualized in the region of the catheter tip. Catheter tip placement within a loop of sigmoid colon cannot be entirely excluded by imaging. Patient is scheduled for catheter injection. Electronically Signed   By: Kennith CenterEric  Mansell M.D.   On: 04/24/2016 13:07   Ir Sinus/fist Tube Chk-non Gi  04/24/2016  CLINICAL DATA:  Pelvic abscess, status post percutaneous drain 04/21/2016. Catheter output is fecal contaminated. Assess for fistula. EXAM: SINUS TRACT INJECTION/FISTULOGRAM COMPARISON:  04/21/2016 FINDINGS: Catheter injection demonstrates a collapsed abscess cavity. There is a fistula from the collapsed abscess drain catheter site to the adjacent sigmoid. The retention loop of the drain catheter appears to contact the sigmoid wall at the site of the fistula. IMPRESSION: Stable drain catheter position. Fistula from the collapsed abscess cavity drain catheter site to the adjacent sigmoid. Retention loop of the drain catheter appears to contact the sigmoid wall. Electronically Signed   By: Judie PetitM.  Shick M.D.   On: 04/24/2016 13:18   Ct Image Guided Drainage By Percutaneous Catheter  04/25/2016  INDICATION: Residual left pelvic tubo-ovarian abscess. EXAM: CT GUIDED DRAINAGE OF LEFT PELVIC ABSCESS MEDICATIONS: The patient is currently admitted to the hospital and receiving intravenous antibiotics. The  antibiotics were administered within an appropriate time frame prior to the initiation of the procedure. ANESTHESIA/SEDATION: 2.0 mg IV Versed 100 mcg IV Fentanyl Moderate Sedation Time:  17 MINUTES The  patient was continuously monitored during the procedure by the interventional radiology nurse under my direct supervision. COMPLICATIONS: None immediate. TECHNIQUE: Informed written consent was obtained from the patient after a thorough discussion of the procedural risks, benefits and alternatives. All questions were addressed. Maximal Sterile Barrier Technique was utilized including caps, mask, sterile gowns, sterile gloves, sterile drape, hand hygiene and skin antiseptic. A timeout was performed prior to the initiation of the procedure. PROCEDURE: The anterior left lower quadrant was prepped with ChloraPrep in a sterile fashion, and a sterile drape was applied covering the operative field. A sterile gown and sterile gloves were used for the procedure. Local anesthesia was provided with 1% Lidocaine. No previous imaging reviewed. Patient positioned supine. Noncontrast localization CT performed through the pelvis. The undrained left pelvic complex loculated fluid collection was localized compatible with a tubo-ovarian abscess. Under sterile conditions and local anesthesia, an 18 gauge 10 cm access needle was advanced from an anterior left lower quadrant oblique approach. Needle position confirmed in the complex fluid collection. Syringe aspiration yielded serosanguineous fluid with exudative debris. Guidewire inserted followed by tract dilatation to insert a 12 [place] drain. Drain catheter position confirmed with CT. This was secured with a Prolene suture and connected to external gravity drainage. Sample sent for Gram stain and culture. Sterile dressing applied. No immediate complication FINDINGS: Imaging confirms needle placement in the left pelvic abscess for drain insertion IMPRESSION: Successful CT-guided left pelvic abscess drain insertion Electronically Signed   By: Judie Petit.  Shick M.D.   On: 04/25/2016 09:03    Labs:  CBC:  Recent Labs  04/21/16 0511 04/22/16 0512 04/23/16 0401 04/24/16 0557  WBC  10.8* 12.4* 11.0* 10.4  HGB 12.0 12.0 11.7* 11.2*  HCT 35.5* 35.5* 34.8* 34.0*  PLT 393 396 372 370    COAGS:  Recent Labs  04/21/16 0839  INR 1.35    BMP:  Recent Labs  04/20/16 0633 04/21/16 0511 04/22/16 0512 04/24/16 0557  NA 137 134* 136 136  K 3.1* 3.9 4.2 3.9  CL 101 99* 102 104  CO2 27 26 25 27   GLUCOSE 100* 91 94 108*  BUN 6 5* <5* <5*  CALCIUM 8.1* 8.1* 8.1* 8.0*  CREATININE 0.75 0.80 0.71 0.78  GFRNONAA >60 >60 >60 >60  GFRAA >60 >60 >60 >60    LIVER FUNCTION TESTS:  Recent Labs  04/20/16 0633 04/21/16 0511 04/22/16 0512 04/24/16 0557  BILITOT 0.7 0.5 0.6 0.5  AST 14* 28 25 19   ALT 7* 10* 10* 10*  ALKPHOS 77 77 66 71  PROT 6.2* 5.8* 5.4* 5.1*  ALBUMIN 2.2* 2.2* 2.2* 2.1*    Assessment and Plan: Pelvic/TOA abscesses, s/p drainage 7/10 and 7/13; 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 Jeananne Rama 04/25/2016, 11:28 AM   I spent a total of 15 minutes at the the patient's bedside AND on the patient's hospital floor or unit, greater than 50%  of which was counseling/coordinating care for pelvic abscess drains

## 2016-04-26 LAB — AEROBIC/ANAEROBIC CULTURE (SURGICAL/DEEP WOUND)

## 2016-04-26 LAB — AEROBIC/ANAEROBIC CULTURE W GRAM STAIN (SURGICAL/DEEP WOUND)

## 2016-04-26 MED ORDER — LIP MEDEX EX OINT
TOPICAL_OINTMENT | CUTANEOUS | Status: AC
  Administered 2016-04-26: 23:00:00
  Filled 2016-04-26: qty 7

## 2016-04-26 NOTE — Progress Notes (Signed)
Progress Note: General Surgery Service   Subjective: Pain moderate, worse with standing  Objective: Vital signs in last 24 hours: Temp:  [97.9 F (36.6 C)-99.2 F (37.3 C)] 97.9 F (36.6 C) (07/15 0552) Pulse Rate:  [72-88] 76 (07/15 0552) Resp:  [15-16] 16 (07/15 0552) BP: (90-114)/(66-80) 114/80 mmHg (07/15 0552) SpO2:  [96 %-100 %] 100 % (07/15 0552) Last BM Date: 04/25/16  Intake/Output from previous day: 07/14 0701 - 07/15 0700 In: 1588 [P.O.:360; I.V.:1200] Out: 400 [Urine:400] Intake/Output this shift:    Lungs: CTAB  Cardiovascular: RRR  Abd: soft, ATTP in lower abdomen, no guarding or rebound  Extremities: no edema  Neuro: AOx4  Lab Results: CBC   Recent Labs  04/24/16 0557  WBC 10.4  HGB 11.2*  HCT 34.0*  PLT 370   BMET  Recent Labs  04/24/16 0557  NA 136  K 3.9  CL 104  CO2 27  GLUCOSE 108*  BUN <5*  CREATININE 0.78  CALCIUM 8.0*   PT/INR No results for input(s): LABPROT, INR in the last 72 hours. ABG No results for input(s): PHART, HCO3 in the last 72 hours.  Invalid input(s): PCO2, PO2  Studies/Results:  Anti-infectives: Anti-infectives    Start     Dose/Rate Route Frequency Ordered Stop   04/22/16 1800  ertapenem (INVANZ) 1 g in sodium chloride 0.9 % 50 mL IVPB     1 g 100 mL/hr over 30 Minutes Intravenous Every 24 hours 04/22/16 1707     04/22/16 1700  ertapenem [facility]) injection 1 g  Status:  Discontinued     1 g Intramuscular Every 24 hours 04/22/16 1653 04/22/16 1707   04/19/16 0200  clindamycin (CLEOCIN) IVPB 900 mg  Status:  Discontinued     900 mg 100 mL/hr over 30 Minutes Intravenous 2 times per day 04/18/16 1607 04/22/16 1653   04/18/16 1800  gentamicin (GARAMYCIN) 300 mg, clindamycin (CLEOCIN) 900 mg in dextrose 5 % 100 mL IVPB  Status:  Discontinued     227 mL/hr over 30 Minutes Intravenous Every 24 hours 04/18/16 1637 04/22/16 1653   04/18/16 1700  metroNIDAZOLE (FLAGYL) IVPB 500 mg  Status:  Discontinued      500 mg 100 mL/hr over 60 Minutes Intravenous Every 6 hours 04/18/16 1607 04/22/16 1653   04/18/16 1615  gentamicin (GARAMYCIN) 5 mg/kg in dextrose 5 % 100 mL IVPB  Status:  Discontinued     5 mg/kg (Adjusted) 100 mL/hr over 60 Minutes Intravenous Every 24 hours 04/18/16 1607 04/18/16 1635      Medications: Scheduled Meds: . sodium chloride   Intravenous Once  . ertapenem  1 g Intravenous Q24H  . famotidine  20 mg Oral BID  . feeding supplement  1 Container Oral TID BM  . heparin subcutaneous  5,000 Units Subcutaneous Q8H  . polyethylene glycol  17 g Oral Daily  . saccharomyces boulardii  250 mg Oral BID   Continuous Infusions: . sodium chloride 100 mL/hr at 04/26/16 0226   PRN Meds:.guaiFENesin, HYDROmorphone (DILAUDID) injection, menthol-cetylpyridinium, ondansetron **OR** ondansetron (ZOFRAN) IV, oxyCODONE-acetaminophen, promethazine, zolpidem  Assessment/Plan: Patient Active Problem List   Diagnosis Date Noted  . TOA (tubo-ovarian abscess) 04/19/2016  . Pelvic pain in female 04/18/2016  . PCOS (polycystic ovarian syndrome) 11/14/2012  . Gestational diabetes mellitus, class A1 11/14/2012  . SVD (spontaneous vaginal delivery) 11/13/2012  . Postpartum care following vaginal delivery (2/1) 11/13/2012  -tuboovarian abscess s/p 2 drainage procedures -continue IV abx (ertapenem started 7/11, Abx gent/flagyl/clinda started 7/7) -  continue drain management -ok for regular diet -WBC in normal range -f/u final cultures   LOS: 8 days   Rodman PickleLuke Aaron Kinsinger, MD Pg# [phone] [facility]

## 2016-04-27 NOTE — Progress Notes (Signed)
Pt complained of chest pain behind left breast after ambulating in the hallway. Pt's vitals were P 94 BP 108/70 O2 100 on RA. Paged Dr. Sheliah HatchKinsinger and received verbal order for a 12 lead EKG. EKG read NSR and Dr. Sheliah HatchKinsinger was notified of EKG results. Will continue to monitor pt.

## 2016-04-27 NOTE — Progress Notes (Signed)
Referring Physician(s): Dr Claud Kelp  Supervising Physician: Simonne Come  Patient Status:  Inpatient  Chief Complaint:  Pelvic abscesses  Subjective:  Rt pelvic drain placed 7/10 + fistula Lt pelvic abscess drain placed 7/13  Pt does feel better daily Minimal output both Bloody/serous on left Brown/fecelunt on Rt  Allergies: Latex  Medications: Prior to Admission medications   Medication Sig Start Date End Date Taking? Authorizing Provider  ondansetron (ZOFRAN ODT) 4 MG disintegrating tablet Take 1 tablet (4 mg total) by mouth every 8 (eight) hours as needed for nausea or vomiting. 04/10/16  Yes Barrett Henle, PA-C  oxyCODONE-acetaminophen (PERCOCET/ROXICET) 5-325 MG tablet Take 1 tablet by mouth every 2 (two) hours as needed for severe pain.   Yes Historical Provider, MD  oxyCODONE (ROXICODONE) 5 MG immediate release tablet Take 1 tablet (5 mg total) by mouth every 4 (four) hours as needed for severe pain. Patient not taking: Reported on 04/18/2016 04/10/16   Barrett Henle, PA-C     Vital Signs: BP 114/71 mmHg  Pulse 90  Temp(Src) 98.7 F (37.1 C) (Oral)  Resp 18  Ht  (1.575 m)  Wt 134 lb (60.782 kg)  BMI 24.50 kg/m2  SpO2 100%  Physical Exam  Abdominal: Soft. Bowel sounds are normal. There is tenderness.  Skin: Skin is warm and dry.  Sites of abscess drain clean and dry Tender B No bleeding  Rt output 20 cc yesterday Minimal in bag now: +brown fecelunt Rt drain Cx +ecoli  Lt output 10 cc yesterday; bloody Minimal in bag Lt drain Cx NO growth   Nursing note and vitals reviewed.   Imaging: Ct Pelvis W Contrast  04/24/2016  CLINICAL DATA:  Left lower quadrant drain placement. EXAM: CT PELVIS WITH CONTRAST TECHNIQUE: Multidetector CT imaging of the pelvis was performed using the standard protocol following the bolus administration of intravenous contrast. CONTRAST:  ISOVUE-300 IOPAMIDOL (ISOVUE-300) INJECTION 61%  COMPARISON:  04/21/2016.  04/20/2016. FINDINGS: Fluid collections seen previously in the region of the catheter tip is no longer evident. Catheter tip appears coiled in very close proximity to the sigmoid colon and intraluminal tip placement within the colon cannot be excluded. There is a persistent complex 4.6 x 2.8 cm fluid collection in the left adnexal region, suspicious for residual abscess. Small amount of free fluid is evident in the cul-de-sac. Gas in the urinary bladder is presumably related to recent instrumentation. IMPRESSION: No fluid visualized in the region of the catheter tip. Catheter tip placement within a loop of sigmoid colon cannot be entirely excluded by imaging. Patient is scheduled for catheter injection. Electronically Signed   By: Kennith Center M.D.   On: 04/24/2016 13:07   Ir Sinus/fist Tube Chk-non Gi  04/24/2016  CLINICAL DATA:  Pelvic abscess, status post percutaneous drain 04/21/2016. Catheter output is fecal contaminated. Assess for fistula. EXAM: SINUS TRACT INJECTION/FISTULOGRAM COMPARISON:  04/21/2016 FINDINGS: Catheter injection demonstrates a collapsed abscess cavity. There is a fistula from the collapsed abscess drain catheter site to the adjacent sigmoid. The retention loop of the drain catheter appears to contact the sigmoid wall at the site of the fistula. IMPRESSION: Stable drain catheter position. Fistula from the collapsed abscess cavity drain catheter site to the adjacent sigmoid. Retention loop of the drain catheter appears to contact the sigmoid wall. Electronically Signed   By: Judie Petit.  Shick M.D.   On: 04/24/2016 13:18   Ct Image Guided Drainage By Percutaneous Catheter  04/25/2016  INDICATION: Residual left  pelvic tubo-ovarian abscess. EXAM: CT GUIDED DRAINAGE OF LEFT PELVIC ABSCESS MEDICATIONS: The patient is currently admitted to the hospital and receiving intravenous antibiotics. The antibiotics were administered within an appropriate time frame prior to the  initiation of the procedure. ANESTHESIA/SEDATION: 2.0 mg IV Versed 100 mcg IV Fentanyl Moderate Sedation Time:  17 MINUTES The patient was continuously monitored during the procedure by the interventional radiology nurse under my direct supervision. COMPLICATIONS: None immediate. TECHNIQUE: Informed written consent was obtained from the patient after a thorough discussion of the procedural risks, benefits and alternatives. All questions were addressed. Maximal Sterile Barrier Technique was utilized including caps, mask, sterile gowns, sterile gloves, sterile drape, hand hygiene and skin antiseptic. A timeout was performed prior to the initiation of the procedure. PROCEDURE: The anterior left lower quadrant was prepped with ChloraPrep in a sterile fashion, and a sterile drape was applied covering the operative field. A sterile gown and sterile gloves were used for the procedure. Local anesthesia was provided with 1% Lidocaine. No previous imaging reviewed. Patient positioned supine. Noncontrast localization CT performed through the pelvis. The undrained left pelvic complex loculated fluid collection was localized compatible with a tubo-ovarian abscess. Under sterile conditions and local anesthesia, an 18 gauge 10 cm access needle was advanced from an anterior left lower quadrant oblique approach. Needle position confirmed in the complex fluid collection. Syringe aspiration yielded serosanguineous fluid with exudative debris. Guidewire inserted followed by tract dilatation to insert a 12 [place]French drain. Drain catheter position confirmed with CT. This was secured with a Prolene suture and connected to external gravity drainage. Sample sent for Gram stain and culture. Sterile dressing applied. No immediate complication FINDINGS: Imaging confirms needle placement in the left pelvic abscess for drain insertion IMPRESSION: Successful CT-guided left pelvic abscess drain insertion Electronically Signed   By: Judie PetitM.  Shick M.D.    On: 04/25/2016 09:03    Labs:  CBC:  Recent Labs  04/21/16 0511 04/22/16 0512 04/23/16 0401 04/24/16 0557  WBC 10.8* 12.4* 11.0* 10.4  HGB 12.0 12.0 11.7* 11.2*  HCT 35.5* 35.5* 34.8* 34.0*  PLT 393 396 372 370    COAGS:  Recent Labs  04/21/16 0839  INR 1.35    BMP:  Recent Labs  04/20/16 0633 04/21/16 0511 04/22/16 0512 04/24/16 0557  NA 137 134* 136 136  K 3.1* 3.9 4.2 3.9  CL 101 99* 102 104  CO2 27 26 25 27   GLUCOSE 100* 91 94 108*  BUN 6 5* <5* <5*  CALCIUM 8.1* 8.1* 8.1* 8.0*  CREATININE 0.75 0.80 0.71 0.78  GFRNONAA >60 >60 >60 >60  GFRAA >60 >60 >60 >60    LIVER FUNCTION TESTS:  Recent Labs  04/20/16 0633 04/21/16 0511 04/22/16 0512 04/24/16 0557  BILITOT 0.7 0.5 0.6 0.5  AST 14* 28 25 19   ALT 7* 10* 10* 10*  ALKPHOS 77 77 66 71  PROT 6.2* 5.8* 5.4* 5.1*  ALBUMIN 2.2* 2.2* 2.2* 2.1*    Assessment and Plan:  Pelvic abscesses Drains in place For CT Mon per [facility]  Electronically Signed: Tonesha Tsou A 04/27/2016, 10:48 AM   I spent a total of 15 Minutes at the the patient's bedside AND on the patient's hospital floor or unit, greater than 50% of which was counseling/coordinating care for pelvic abscesses

## 2016-04-27 NOTE — Progress Notes (Signed)
Progress Note: General Surgery Service   Subjective: Pain controlled, tolerating diet, +BM, ambulating  Objective: Vital signs in last 24 hours: Temp:  [98.3 F (36.8 C)-99.6 F (37.6 C)] 98.7 F (37.1 C) (07/16 0428) Pulse Rate:  [85-90] 90 (07/16 0428) Resp:  [14-18] 18 (07/16 0428) BP: (106-122)/(70-81) 114/71 mmHg (07/16 0428) SpO2:  [99 %-100 %] 100 % (07/16 0428) Last BM Date: 04/26/16  Intake/Output from previous day: 07/15 0701 - 07/16 0700 In: 1770 [P.O.:540; I.V.:1200] Out: 30 [Drains:30] Intake/Output this shift:    Lungs: CTAB  Cardiovascular: RRR  Abd: soft, ATTP by drains, no guarding or rebound, left drain with serosang drainage, right drain with brown liquid and some air in bag  Extremities: no edema  Neuro: AOx4  Lab Results: CBC  No results for input(s): WBC, HGB, HCT, PLT in the last 72 hours. BMET No results for input(s): NA, K, CL, CO2, GLUCOSE, BUN, CREATININE, CALCIUM in the last 72 hours. PT/INR No results for input(s): LABPROT, INR in the last 72 hours. ABG No results for input(s): PHART, HCO3 in the last 72 hours.  Invalid input(s): PCO2, PO2  Studies/Results:  Anti-infectives: Anti-infectives    Start     Dose/Rate Route Frequency Ordered Stop   04/22/16 1800  ertapenem (INVANZ) 1 g in sodium chloride 0.9 % 50 mL IVPB     1 g 100 mL/hr over 30 Minutes Intravenous Every 24 hours 04/22/16 1707     04/22/16 1700  ertapenem [facility](INVANZ) injection 1 g  Status:  Discontinued     1 g Intramuscular Every 24 hours 04/22/16 1653 04/22/16 1707   04/19/16 0200  clindamycin (CLEOCIN) IVPB 900 mg  Status:  Discontinued     900 mg 100 mL/hr over 30 Minutes Intravenous 2 times per day 04/18/16 1607 04/22/16 1653   04/18/16 1800  gentamicin (GARAMYCIN) 300 mg, clindamycin (CLEOCIN) 900 mg in dextrose 5 % 100 mL IVPB  Status:  Discontinued     227 mL/hr over 30 Minutes Intravenous Every 24 hours 04/18/16 1637 04/22/16 1653   04/18/16 1700   metroNIDAZOLE (FLAGYL) IVPB 500 mg  Status:  Discontinued     500 mg 100 mL/hr over 60 Minutes Intravenous Every 6 hours 04/18/16 1607 04/22/16 1653   04/18/16 1615  gentamicin (GARAMYCIN) 5 mg/kg in dextrose 5 % 100 mL IVPB  Status:  Discontinued     5 mg/kg (Adjusted) 100 mL/hr over 60 Minutes Intravenous Every 24 hours 04/18/16 1607 04/18/16 1635      Medications: Scheduled Meds: . sodium chloride   Intravenous Once  . ertapenem  1 g Intravenous Q24H  . famotidine  20 mg Oral BID  . feeding supplement  1 Container Oral TID BM  . heparin subcutaneous  5,000 Units Subcutaneous Q8H  . polyethylene glycol  17 g Oral Daily  . saccharomyces boulardii  250 mg Oral BID   Continuous Infusions: . sodium chloride 100 mL/hr at 04/26/16 1628   PRN Meds:.guaiFENesin, HYDROmorphone (DILAUDID) injection, menthol-cetylpyridinium, ondansetron **OR** ondansetron (ZOFRAN) IV, oxyCODONE-acetaminophen, promethazine, zolpidem  Assessment/Plan: Patient Active Problem List   Diagnosis Date Noted  . TOA (tubo-ovarian abscess) 04/19/2016  . Pelvic pain in female 04/18/2016  . PCOS (polycystic ovarian syndrome) 11/14/2012  . Gestational diabetes mellitus, class A1 11/14/2012  . SVD (spontaneous vaginal delivery) 11/13/2012  . 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 -reimage tomorrow with PO/PR/IV contrast -CBC in am -continue abx -continue  drain management   LOS: 9 days   Rodman Pickle, MD Pg# [phone] [facility]

## 2016-04-28 ENCOUNTER — Inpatient Hospital Stay (HOSPITAL_COMMUNITY): Payer: Commercial Managed Care - PPO

## 2016-04-28 ENCOUNTER — Encounter (HOSPITAL_COMMUNITY): Payer: Self-pay | Admitting: Radiology

## 2016-04-28 LAB — CBC
HEMATOCRIT: 35.2 % — AB (ref 36.0–46.0)
HEMOGLOBIN: 11.5 g/dL — AB (ref 12.0–15.0)
MCH: 30.4 pg (ref 26.0–34.0)
MCHC: 32.7 g/dL (ref 30.0–36.0)
MCV: 93.1 fL (ref 78.0–100.0)
Platelets: 505 10*3/uL — ABNORMAL HIGH (ref 150–400)
RBC: 3.78 MIL/uL — ABNORMAL LOW (ref 3.87–5.11)
RDW: 14.4 % (ref 11.5–15.5)
WBC: 14.1 10*3/uL — AB (ref 4.0–10.5)

## 2016-04-28 MED ORDER — IOPAMIDOL (ISOVUE-300) INJECTION 61%
100.0000 mL | Freq: Once | INTRAVENOUS | Status: AC | PRN
  Administered 2016-04-28: 100 mL via INTRAVENOUS

## 2016-04-28 MED ORDER — HYDROMORPHONE HCL 1 MG/ML IJ SOLN
1.0000 mg | INTRAMUSCULAR | Status: DC | PRN
  Administered 2016-04-28 – 2016-04-29 (×6): 1 mg via INTRAVENOUS
  Filled 2016-04-28 (×6): qty 1

## 2016-04-28 MED ORDER — DEXTROSE 5 % IV SOLN
2.0000 g | INTRAVENOUS | Status: DC
  Administered 2016-04-28 – 2016-05-01 (×4): 2 g via INTRAVENOUS
  Filled 2016-04-28 (×5): qty 2

## 2016-04-28 MED ORDER — OXYCODONE HCL 5 MG PO TABS
5.0000 mg | ORAL_TABLET | ORAL | Status: DC | PRN
  Administered 2016-04-28 (×3): 15 mg via ORAL
  Administered 2016-04-28 – 2016-04-29 (×2): 10 mg via ORAL
  Administered 2016-04-29 (×4): 15 mg via ORAL
  Administered 2016-04-30: 10 mg via ORAL
  Administered 2016-04-30: 15 mg via ORAL
  Administered 2016-04-30: 10 mg via ORAL
  Administered 2016-04-30 – 2016-05-01 (×4): 15 mg via ORAL
  Filled 2016-04-28: qty 2
  Filled 2016-04-28: qty 3
  Filled 2016-04-28: qty 2
  Filled 2016-04-28: qty 3
  Filled 2016-04-28: qty 2
  Filled 2016-04-28 (×11): qty 3

## 2016-04-28 MED ORDER — DIATRIZOATE MEGLUMINE & SODIUM 66-10 % PO SOLN: 30.0000 mL | ORAL | Status: DC | PRN

## 2016-04-28 MED ORDER — ACETAMINOPHEN 500 MG PO TABS
1000.0000 mg | ORAL_TABLET | Freq: Four times a day (QID) | ORAL | Status: DC
  Administered 2016-04-28 – 2016-05-01 (×10): 1000 mg via ORAL
  Filled 2016-04-28 (×12): qty 2

## 2016-04-28 NOTE — Progress Notes (Signed)
  Subjective: Stable this AM, nothing in either drain, for CT this AM.  No real complaints, she forgot where she was this AM.   She is slowly decreasing her dilaudid (5 mg yesterday, 3 mg this AM. Percocet 8 tabs yesterday) Objective: Vital signs in last 24 hours: Temp:  [98.1 F (36.7 C)-98.4 F (36.9 C)] 98.1 F (36.7 C) (07/17 0557) Pulse Rate:  [94-105] 95 (07/17 0557) Resp:  [18] 18 (07/17 0557) BP: (108-120)/(70-83) 112/72 mmHg (07/17 0557) SpO2:  [99 %-100 %] 99 % (07/17 0557) Last BM Date: 04/27/16 840 PO Afebrile, VSS still a little tachycardic WBC 14.1 this AM CT last drain placed 04/25/16  Repeat CT ordered for today  Intake/Output from previous day: 07/16 0701 - 07/17 0700 In: 3196.7 [P.O.:840; I.V.:2336.7] Out: 20 [Drains:20] Intake/Output this shift:    General appearance: alert, cooperative and no distress Resp: clear to auscultation bilaterally GI: soft, sore, sites ok , nothing in either drain, some air in bag on the right.  Lab Results:   Recent Labs  04/28/16 0409  WBC 14.1*  HGB 11.5*  HCT 35.2*  PLT 505*    BMET No results for input(s): NA, K, CL, CO2, GLUCOSE, BUN, CREATININE, CALCIUM in the last 72 hours. PT/INR No results for input(s): LABPROT, INR in the last 72 hours.   Recent Labs Lab 04/22/16 0512 04/24/16 0557  AST 25 19  ALT 10* 10*  ALKPHOS 66 71  BILITOT 0.6 0.5  PROT 5.4* 5.1*  ALBUMIN 2.2* 2.1*     Lipase     Component Value Date/Time   LIPASE 32 04/10/2016 1126     Studies/Results: No results found.  Medications: . sodium chloride   Intravenous Once  . ertapenem  1 g Intravenous Q24H  . famotidine  20 mg Oral BID  . feeding supplement  1 Container Oral TID BM  . heparin subcutaneous  5,000 Units Subcutaneous Q8H  . polyethylene glycol  17 g Oral Daily  . 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        LOS: 10 days    Sheila Willis 04/28/2016 [phone]

## 2016-04-28 NOTE — Progress Notes (Signed)
Late entry, pt seen at 6:30p  HD#[id number] POD#7 s/p CT guided percutaneous drain placement #1 POD#3 s/p CT guided percutaneou drain placement #2  HPI:. Pt notes continued improvement in abdominal pain. Had been tolerating regular diet but NPO today for repeat CT scan. Placed on clears post-CT due to ileus. Pt states she has been tolerating her Percocet and with decreased IV pain med use. Pt states BM yesterday. Pt ambulating several times per day but has discomfort from drains when trying to sit in chair. Pt notes continued back pain. No vaginal bleeding, no vaginal itching.   PE: Filed Vitals:   04/27/16 1655 04/27/16 2154 04/28/16 0557 04/28/16 1400  BP: 108/70 120/83 112/72 115/73  Pulse: 94 105 95 86  Temp: 98.3 F (36.8 C) 98.4 F (36.9 C) 98.1 F (36.7 C) 98.7 F (37.1 C)  TempSrc: Oral Oral Oral Oral  Resp: 18 18 18 17   Height:      Weight:      SpO2: 100% 100% 99% 100%   Gen: Alert, cooperative, no acute distress Abd: soft, no distension, no rebound, no guarding, mild pain with palpation in BLQ,  no tympany.  Percutaneous drain in place x 2, minimal serosanguinous output from drain #2, feculent material in drain #1.  GU: def today LE: no edema, NT Psych: tearful on exam initially but appropriate for her situation, maintains good eye contact, frustrated with prolonged course but still hopeful.  UCx negative GC/CT neg/ HIV neg Gram stain from fluid: E. Coli.   CBC Latest Ref Rng 04/28/2016 04/24/2016 04/23/2016  WBC 4.0 - 10.5 K/uL 14.1(H) 10.4 11.0(H)  Hemoglobin 12.0 - 15.0 g/dL 11.5(L) 11.2(L) 11.7(L)  Hematocrit 36.0 - 46.0 % 35.2(L) 34.0(L) 34.8(L)  Platelets 150 - 400 K/uL 505(H) 370 372   CT 7/9: Diffusely thick-walled peripherally enhancing abscess noted superior to the uterus, measuring 6.1 x 3.1 x 1.9 cm. This appears contiguous with a complex left-sided tubo-ovarian abscess, which measures approximately 4.9 x 3.5 x 3.2 cm. Scattered air tracks about the pelvic  abscess and tubo-ovarian abscess, with a prominent 4.7 cm pocket of air noted superior to the tubo-ovarian abscess. Diffuse surrounding soft tissue inflammation noted. 2. The pelvic abscess directly abuts the mid sigmoid colon, and there is question of a tract between the mid sigmoid colon and pelvic abscess on coronal images. The amount of air within the abscesses is significantly more than expected for a tubo-ovarian abscess origin, and this raises concern for focal colonic perforation. This could have arisen either from focal sigmoid diverticulitis with abscess formation, or possibly erosion at the sigmoid colon from the tubo-ovarian abscess extending into the Pelvis.  CT 7/13: resolution of abscess with drain catheter, residual deep pelvic abscess present. Fistulous tract from abscess cavity to sigmoid.   A/P:29 yo G2P2, non-preg patient with acute pelvic pain due to TOA. Improving abscess cavities but now with fistulous tract to sigmoid.   - Fistula. Managed by general surgery with plans for non-surgical approach at this time. Continue with drain and antibiotics.   - Ileus. Finding d/w pt today.  - PID/ TOA:  [name] 04/18/16. GC/CT/ cultures negative. Appreciate ID management and antibiotic selection.  Pt did 5 days  IV gent, clindamycin and flagyl, started on admission and switched on 7/11 to Invanz at the recommendation of ID. Now with plans to switch to ceftriaxone. Clinically pt is improving with improvement in pain and lack of fevers. Increase in white count today deserves close attention. For  typical treatment of TOA we would manage with antibiotics and consider surgery if persistent pain or hydrosalpynx several months after infection resolved. If sepsis or worsening clinical course, surgery may be necessary and would likely be LSO. Pt aware.   - Pain. IV/po narcotics  - Contraception. IUD removed. Alternative options d/w pt. Will plan Nexplanon as outpt. Avoid  intrauterine contraception for at least 3 months.   Helvi Royals A. 04/28/2016 9:50 PM   [phone], please call with any gyn concerns. I will see pt as needed.

## 2016-04-28 NOTE — Progress Notes (Signed)
INFECTIOUS DISEASE PROGRESS NOTE  ID: Sheila Willis is a 30 y.o. female with  Active Problems:   Pelvic pain in female   TOA (tubo-ovarian abscess)  Subjective: C/o continued pain, c/o pain from CT scan.   Abtx:  Anti-infectives    Start     Dose/Rate Route Frequency Ordered Stop   04/22/16 1800  ertapenem (INVANZ) 1 g in sodium chloride 0.9 % 50 mL IVPB     1 g 100 mL/hr over 30 Minutes Intravenous Every 24 hours 04/22/16 1707     04/22/16 1700  ertapenem [facility]) injection 1 g  Status:  Discontinued     1 g Intramuscular Every 24 hours 04/22/16 1653 04/22/16 1707   04/19/16 0200  clindamycin (CLEOCIN) IVPB 900 mg  Status:  Discontinued     900 mg 100 mL/hr over 30 Minutes Intravenous 2 times per day 04/18/16 1607 04/22/16 1653   04/18/16 1800  gentamicin (GARAMYCIN) 300 mg, clindamycin (CLEOCIN) 900 mg in dextrose 5 % 100 mL IVPB  Status:  Discontinued     227 mL/hr over 30 Minutes Intravenous Every 24 hours 04/18/16 1637 04/22/16 1653   04/18/16 1700  metroNIDAZOLE (FLAGYL) IVPB 500 mg  Status:  Discontinued     500 mg 100 mL/hr over 60 Minutes Intravenous Every 6 hours 04/18/16 1607 04/22/16 1653   04/18/16 1615  gentamicin (GARAMYCIN) 5 mg/kg in dextrose 5 % 100 mL IVPB  Status:  Discontinued     5 mg/kg (Adjusted) 100 mL/hr over 60 Minutes Intravenous Every 24 hours 04/18/16 1607 04/18/16 1635      Medications:  Scheduled: . sodium chloride   Intravenous Once  . acetaminophen  1,000 mg Oral Q6H  . ertapenem  1 g Intravenous Q24H  . famotidine  20 mg Oral BID  . feeding supplement  1 Container Oral TID BM  . heparin subcutaneous  5,000 Units Subcutaneous Q8H  . polyethylene glycol  17 g Oral Daily  . saccharomyces boulardii  250 mg Oral BID    Objective: Vital signs in last 24 hours: Temp:  [98.1 F (36.7 C)-98.7 F (37.1 C)] 98.7 F (37.1 C) (07/17 1400) Pulse Rate:  [86-105] 86 (07/17 1400) Resp:  [17-18] 17 (07/17 1400) BP: (112-120)/(72-83) 115/73  mmHg (07/17 1400) SpO2:  [99 %-100 %] 100 % (07/17 1400)   General appearance: alert, cooperative and no distress Resp: clear to auscultation bilaterally Cardio: regular rate and rhythm GI: normal findings: bowel sounds normal and soft, non-tender  Lab Results  Recent Labs  04/28/16 0409  WBC 14.1*  HGB 11.5*  HCT 35.2*   Liver Panel No results for input(s): PROT, ALBUMIN, AST, ALT, ALKPHOS, BILITOT, BILIDIR, IBILI in the last 72 hours. Sedimentation Rate No results for input(s): ESRSEDRATE in the last 72 hours. C-Reactive Protein No results for input(s): CRP in the last 72 hours.  Microbiology: Recent Results (from the past 240 hour(s))  Urine culture     Status: None   Collection Time: 04/18/16  8:00 PM  Result Value Ref Range Status   Specimen Description URINE, CLEAN CATCH  Final   Special Requests NONE  Final   Culture NO GROWTH Performed at [facility]   Final   Report Status 04/20/2016 FINAL  Final  Aerobic/Anaerobic Culture (surgical/deep wound)     Status: None   Collection Time: 04/21/16 10:59 AM  Result Value Ref Range Status   Specimen Description   Final    ABSCESS PELVIS DRAINAGE Performed at [facility]    Special Requests NONE  Final   Gram Stain   Final    ABUNDANT WBC PRESENT, PREDOMINANTLY PMN MODERATE GRAM POSITIVE RODS FEW GRAM NEGATIVE RODS NO SQUAMOUS EPITHELIAL CELLS SEEN    Culture   Final    ABUNDANT ESCHERICHIA COLI NO ANAEROBES ISOLATED Performed at [facility]    Report Status 04/26/2016 FINAL  Final   Organism ID, Bacteria ESCHERICHIA COLI  Final      Susceptibility   Escherichia coli - MIC*    AMPICILLIN >=32 RESISTANT Resistant     CEFAZOLIN <=4 SENSITIVE Sensitive     CEFEPIME <=1 SENSITIVE Sensitive     CEFTAZIDIME <=1 SENSITIVE Sensitive     CEFTRIAXONE <=1 SENSITIVE Sensitive     CIPROFLOXACIN <=0.25 SENSITIVE Sensitive     GENTAMICIN >=16 RESISTANT Resistant     IMIPENEM <=0.25  SENSITIVE Sensitive     TRIMETH/SULFA >=320 RESISTANT Resistant     AMPICILLIN/SULBACTAM 16 INTERMEDIATE Intermediate     PIP/TAZO <=4 SENSITIVE Sensitive     * ABUNDANT ESCHERICHIA COLI  Aerobic/Anaerobic Culture (surgical/deep wound)     Status: None (Preliminary result)   Collection Time: 04/24/16  5:35 PM  Result Value Ref Range Status   Specimen Description ABSCESS LEFT PELVIC  Final   Special Requests Normal  Final   Gram Stain   Final    ABUNDANT WBC PRESENT,BOTH PMN AND MONONUCLEAR NO ORGANISMS SEEN    Culture   Final    NO GROWTH 2 DAYS NO ANAEROBES ISOLATED; CULTURE IN PROGRESS FOR 5 DAYS Performed at [facility]    Report Status PENDING  Incomplete    Studies/Results: Ct Abdomen Pelvis W Contrast  04/28/2016  CLINICAL DATA:  Pelvic abscesses. EXAM: CT ABDOMEN AND PELVIS WITH CONTRAST TECHNIQUE: Multidetector CT imaging of the abdomen and pelvis was performed using the standard protocol following bolus administration of intravenous contrast. CONTRAST:  100mL ISOVUE-300 IOPAMIDOL (ISOVUE-300) INJECTION 61% COMPARISON:  CT scan of April 20, 2016. FINDINGS: Visualized lung bases are unremarkable. No significant osseous abnormality is noted. No gallstones are noted. The liver, spleen and pancreas unremarkable. Nonobstructive right renal calculus is noted. No hydronephrosis or renal obstruction is noted. Adrenal glands are unremarkable. Diffuse colonic dilatation is noted concerning for ileus. The appendix appears normal. No small bowel dilatation is noted. Interval placement of 2 drainage catheters in the pelvis. There is a mild amount of residual fluid seen around the fluid collection in the left adnexal region which is significantly improved compared to prior exam. Fluid collection seen anteriorly in the pelvis appears to be almost completely decompressed. Urinary bladder appears normal. Rectal tube is noted. IMPRESSION: There is been interval placement of 2 drainage catheters  into fluid collections of the pelvis, both of which appear to be significantly improved compared to prior exam. Diffuse colonic dilatation is noted most consistent with ileus. Rectal tube is noted. Small nonobstructive right renal calculus. Electronically Signed   By: Lupita RaiderJames  Green Jr, M.D.   On: 04/28/2016 16:11     Assessment/Plan: TOA Fistula  Drain put out 20 last 24h Will change her to ceftriaxone as repeat CT shows improvement.  Would plan for 3 weeks of atnbx, repeat CT scan at that time.  Please call as needed.   Total days of antibiotics: 10 (invanz)         Johny SaxJeffrey Tyah Acord Infectious Diseases (pager) [phone] [email or web address] 04/28/2016, 5:02 PM  LOS: 10 days

## 2016-04-28 NOTE — Progress Notes (Signed)
Patient ID: Sheila Willis, female   DOB: [birth year], 30 y.o.   MRN: [id number]    Referring Physician(s): Dr. Claud Kelp  Supervising Physician: Simonne Come  Patient Status: inpt  Chief Complaint: Pelvic abscesses  Subjective: Patient with minimal abdominal pain currently, tolerating a regular diet.  Both drains with minimal output according to patient.  Allergies: Latex  Medications: Prior to Admission medications   Medication Sig Start Date End Date Taking? Authorizing Provider  ondansetron (ZOFRAN ODT) 4 MG disintegrating tablet Take 1 tablet (4 mg total) by mouth every 8 (eight) hours as needed for nausea or vomiting. 04/10/16  Yes Barrett Henle, PA-C  oxyCODONE-acetaminophen (PERCOCET/ROXICET) 5-325 MG tablet Take 1 tablet by mouth every 2 (two) hours as needed for severe pain.   Yes Historical Provider, MD  oxyCODONE (ROXICODONE) 5 MG immediate release tablet Take 1 tablet (5 mg total) by mouth every 4 (four) hours as needed for severe pain. Patient not taking: Reported on 04/18/2016 04/10/16   Barrett Henle, PA-C    Vital Signs: BP 112/72 mmHg  Pulse 95  Temp(Src) 98.1 F (36.7 C) (Oral)  Resp 18  Ht  (1.575 m)  Wt 134 lb (60.782 kg)  BMI 24.50 kg/m2  SpO2 99%  Physical Exam: Abd: RLQ drain with feculent appearing output, but mostly lining the drain.  Minimal output.  LLQ drain with no output in the bag currently.  Both sites are c/d/i.  No tenderness to palpation.   Imaging: Ir Sinus/fist Tube Chk-non Gi  04/24/2016  CLINICAL DATA:  Pelvic abscess, status post percutaneous drain 04/21/2016. Catheter output is fecal contaminated. Assess for fistula. EXAM: SINUS TRACT INJECTION/FISTULOGRAM COMPARISON:  04/21/2016 FINDINGS: Catheter injection demonstrates a collapsed abscess cavity. There is a fistula from the collapsed abscess drain catheter site to the adjacent sigmoid. The retention loop of the drain catheter appears to contact the sigmoid  wall at the site of the fistula. IMPRESSION: Stable drain catheter position. Fistula from the collapsed abscess cavity drain catheter site to the adjacent sigmoid. Retention loop of the drain catheter appears to contact the sigmoid wall. Electronically Signed   By: Judie Petit.  Shick M.D.   On: 04/24/2016 13:18   Ct Image Guided Drainage By Percutaneous Catheter  04/25/2016  INDICATION: Residual left pelvic tubo-ovarian abscess. EXAM: CT GUIDED DRAINAGE OF LEFT PELVIC ABSCESS MEDICATIONS: The patient is currently admitted to the hospital and receiving intravenous antibiotics. The antibiotics were administered within an appropriate time frame prior to the initiation of the procedure. ANESTHESIA/SEDATION: 2.0 mg IV Versed 100 mcg IV Fentanyl Moderate Sedation Time:  17 MINUTES The patient was continuously monitored during the procedure by the interventional radiology nurse under my direct supervision. COMPLICATIONS: None immediate. TECHNIQUE: Informed written consent was obtained from the patient after a thorough discussion of the procedural risks, benefits and alternatives. All questions were addressed. Maximal Sterile Barrier Technique was utilized including caps, mask, sterile gowns, sterile gloves, sterile drape, hand hygiene and skin antiseptic. A timeout was performed prior to the initiation of the procedure. PROCEDURE: The anterior left lower quadrant was prepped with ChloraPrep in a sterile fashion, and a sterile drape was applied covering the operative field. A sterile gown and sterile gloves were used for the procedure. Local anesthesia was provided with 1% Lidocaine. No previous imaging reviewed. Patient positioned supine. Noncontrast localization CT performed through the pelvis. The undrained left pelvic complex loculated fluid collection was localized compatible with a tubo-ovarian abscess. Under sterile conditions and local anesthesia, an  18 gauge 10 cm access needle was advanced from an anterior left lower  quadrant oblique approach. Needle position confirmed in the complex fluid collection. Syringe aspiration yielded serosanguineous fluid with exudative debris. Guidewire inserted followed by tract dilatation to insert a 12 [place]French drain. Drain catheter position confirmed with CT. This was secured with a Prolene suture and connected to external gravity drainage. Sample sent for Gram stain and culture. Sterile dressing applied. No immediate complication FINDINGS: Imaging confirms needle placement in the left pelvic abscess for drain insertion IMPRESSION: Successful CT-guided left pelvic abscess drain insertion Electronically Signed   By: Judie PetitM.  Shick M.D.   On: 04/25/2016 09:03    Labs:  CBC:  Recent Labs  04/22/16 0512 04/23/16 0401 04/24/16 0557 04/28/16 0409  WBC 12.4* 11.0* 10.4 14.1*  HGB 12.0 11.7* 11.2* 11.5*  HCT 35.5* 34.8* 34.0* 35.2*  PLT 396 372 370 505*    COAGS:  Recent Labs  04/21/16 0839  INR 1.35    BMP:  Recent Labs  04/20/16 0633 04/21/16 0511 04/22/16 0512 04/24/16 0557  NA 137 134* 136 136  K 3.1* 3.9 4.2 3.9  CL 101 99* 102 104  CO2 27 26 25 27   GLUCOSE 100* 91 94 108*  BUN 6 5* <5* <5*  CALCIUM 8.1* 8.1* 8.1* 8.0*  CREATININE 0.75 0.80 0.71 0.78  GFRNONAA >60 >60 >60 >60  GFRAA >60 >60 >60 >60    LIVER FUNCTION TESTS:  Recent Labs  04/20/16 0633 04/21/16 0511 04/22/16 0512 04/24/16 0557  BILITOT 0.7 0.5 0.6 0.5  AST 14* 28 25 19   ALT 7* 10* 10* 10*  ALKPHOS 77 77 66 71  PROT 6.2* 5.8* 5.4* 5.1*  ALBUMIN 2.2* 2.2* 2.2* 2.1*    Assessment and Plan: 1. TOA/pelvic abscesses, s/p perc drain on 7/10 in RLQ (colonic fistula), 7/13 LLQ drain placed -patient doing well.  She is scheduled for a repeat CT scan today. 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  This will need to remain in place until her fistula has healed. -cont treatment per primary services. -we will follow.  Electronically  Signed: Letha CapeSBORNE,Nox Talent E 04/28/2016, 12:17 PM   I spent a total of 15 Minutes at the the patient's bedside AND on the patient's hospital floor or unit, greater than 50% of which was counseling/coordinating care for pelvic abscesses

## 2016-04-28 NOTE — Progress Notes (Signed)
Nurse changed dressings to bilateral abdominal drains. No drainage noted on dressings removed prior to applying new dressings.

## 2016-04-29 ENCOUNTER — Inpatient Hospital Stay (HOSPITAL_COMMUNITY): Payer: Commercial Managed Care - PPO

## 2016-04-29 LAB — AEROBIC/ANAEROBIC CULTURE W GRAM STAIN (SURGICAL/DEEP WOUND): Special Requests: NORMAL

## 2016-04-29 LAB — BASIC METABOLIC PANEL
ANION GAP: 6 (ref 5–15)
BUN: 6 mg/dL (ref 6–20)
CO2: 28 mmol/L (ref 22–32)
Calcium: 8.8 mg/dL — ABNORMAL LOW (ref 8.9–10.3)
Chloride: 102 mmol/L (ref 101–111)
Creatinine, Ser: 0.83 mg/dL (ref 0.44–1.00)
GLUCOSE: 91 mg/dL (ref 65–99)
POTASSIUM: 3.9 mmol/L (ref 3.5–5.1)
Sodium: 136 mmol/L (ref 135–145)

## 2016-04-29 LAB — CBC
HCT: 36.5 % (ref 36.0–46.0)
Hemoglobin: 11.7 g/dL — ABNORMAL LOW (ref 12.0–15.0)
MCH: 30.2 pg (ref 26.0–34.0)
MCHC: 32.1 g/dL (ref 30.0–36.0)
MCV: 94.1 fL (ref 78.0–100.0)
PLATELETS: 538 10*3/uL — AB (ref 150–400)
RBC: 3.88 MIL/uL (ref 3.87–5.11)
RDW: 14.4 % (ref 11.5–15.5)
WBC: 9.8 10*3/uL (ref 4.0–10.5)

## 2016-04-29 LAB — AEROBIC/ANAEROBIC CULTURE (SURGICAL/DEEP WOUND): CULTURE: NO GROWTH

## 2016-04-29 MED ORDER — FLUCONAZOLE 100 MG PO TABS
100.0000 mg | ORAL_TABLET | Freq: Every day | ORAL | Status: AC
  Administered 2016-04-29 – 2016-04-30 (×2): 100 mg via ORAL
  Filled 2016-04-29 (×2): qty 1

## 2016-04-29 MED ORDER — HYDROMORPHONE HCL 1 MG/ML IJ SOLN
1.0000 mg | INTRAMUSCULAR | Status: DC | PRN
  Administered 2016-04-29 – 2016-04-30 (×3): 1 mg via INTRAVENOUS
  Filled 2016-04-29 (×3): qty 1

## 2016-04-29 NOTE — Progress Notes (Signed)
[facility]  Patient Status:  [facility]  [facility] is providing the following services: HHRN and home Infusion Pharmacy team for home IV ABX. [facility] will provide in hospital hands on teaching with pt/caregiver to ensure safe, solid plan for DC to home. [facility] will follow and support DC when ordered.   If patient discharges after hours, please call [phone].   Sedalia Mutaamela S Chandler 04/29/2016, 11:39 AM

## 2016-04-29 NOTE — Care Management Note (Signed)
Case Management Note  Patient Details  Name: Sheila Willis MRN: [id number] Date of Birth: Apr 16, 1986  Subjective/Objective:                  Left tuboovarian cyst with ongoing pelvic pain Action/Plan: Discharge planning Expected Discharge Date:  04/30/16               Expected Discharge Plan:  [place]  In-House Referral:     Discharge planning Services  CM Consult  Post Acute Care Choice:  Home Health Choice offered to:  Patient  DME Arranged:  IV pump/equipment DME Agency:  [place] Arranged:  RN [facility] Agency:  [place]  Status of Service:  Completed, signed off  If discussed at [place] of Stay Meetings, dates discussed:    Additional Comments: Cm met with pt in room to offer choice of home health agency. Pt chooses [facility] to render [facility] for IV ABX and drain management.  Referral called to [facility].  PICC to be placed today.  NO other CM needs were communicated. Dellie Catholic, RN 04/29/2016, 10:43 AM

## 2016-04-29 NOTE — Progress Notes (Signed)
Subjective: Dilaudid 7 mg yesterday 55 mg Oxycodone yesterday She feels better this AM.  Sites ok  The right drain is dark and does not look all that good.  I don't know if this is old or new.  She also has a vaginal itch like she has candida infection.     Objective: Vital signs in last 24 hours: Temp:  [97.7 F (36.5 C)-98.7 F (37.1 C)] 98.4 F (36.9 C) (07/18 0526) Pulse Rate:  [74-86] 74 (07/18 0526) Resp:  [17-18] 18 (07/18 0526) BP: (106-118)/(65-81) 106/65 mmHg (07/18 0526) SpO2:  [99 %-100 %] 99 % (07/18 0526) Last BM Date: 04/29/16 IV 2338 BM x 2 Drain 10 ml right none on left Afebrile, VSS Lab OK CT yesterday:  There is been interval placement of 2 drainage catheters into fluid collections of the pelvis, both of which appear to be significantly improved compared to prior exam.  Diffuse colonic dilatation is noted most consistent with ileus. Rectal tube is noted.  Small nonobstructive right renal calculus.    Intake/Output from previous day: 07/17 0701 - 07/18 0700 In: 2338.3 [I.V.:2338.3] Out: 910 [Urine:900; Drains:10] Intake/Output this shift:    General appearance: alert, cooperative and no distress GI: soft not tender or distended this AM, less pain.  tolerating clear liquids well.  feels better since BM.    Lab Results:   Recent Labs  04/28/16 0409 04/29/16 0413  WBC 14.1* 9.8  HGB 11.5* 11.7*  HCT 35.2* 36.5  PLT 505* 538*    BMET  Recent Labs  04/29/16 0413  NA 136  K 3.9  CL 102  CO2 28  GLUCOSE 91  BUN 6  CREATININE 0.83  CALCIUM 8.8*   PT/INR No results for input(s): LABPROT, INR in the last 72 hours.   Recent Labs Lab 04/24/16 0557  AST 19  ALT 10*  ALKPHOS 71  BILITOT 0.5  PROT 5.1*  ALBUMIN 2.1*     Lipase     Component Value Date/Time   LIPASE 32 04/10/2016 1126     Studies/Results: Ct Abdomen Pelvis W Contrast  04/28/2016  CLINICAL DATA:  Pelvic abscesses. EXAM: CT ABDOMEN AND PELVIS WITH CONTRAST  TECHNIQUE: Multidetector CT imaging of the abdomen and pelvis was performed using the standard protocol following bolus administration of intravenous contrast. CONTRAST:  100mL ISOVUE-300 IOPAMIDOL (ISOVUE-300) INJECTION 61% COMPARISON:  CT scan of April 20, 2016. FINDINGS: Visualized lung bases are unremarkable. No significant osseous abnormality is noted. No gallstones are noted. The liver, spleen and pancreas unremarkable. Nonobstructive right renal calculus is noted. No hydronephrosis or renal obstruction is noted. Adrenal glands are unremarkable. Diffuse colonic dilatation is noted concerning for ileus. The appendix appears normal. No small bowel dilatation is noted. Interval placement of 2 drainage catheters in the pelvis. There is a mild amount of residual fluid seen around the fluid collection in the left adnexal region which is significantly improved compared to prior exam. Fluid collection seen anteriorly in the pelvis appears to be almost completely decompressed. Urinary bladder appears normal. Rectal tube is noted. IMPRESSION: There is been interval placement of 2 drainage catheters into fluid collections of the pelvis, both of which appear to be significantly improved compared to prior exam. Diffuse colonic dilatation is noted most consistent with ileus. Rectal tube is noted. Small nonobstructive right renal calculus. Electronically Signed   By: Lupita RaiderJames  Green Jr, M.D.   On: 04/28/2016 16:11    Medications: . sodium chloride   Intravenous Once  . acetaminophen  1,000 mg Oral Q6H  . cefTRIAXone (ROCEPHIN)  IV  2 g Intravenous Q24H  . famotidine  20 mg Oral BID  . feeding supplement  1 Container Oral TID BM  . heparin subcutaneous  5,000 Units Subcutaneous Q8H  . polyethylene glycol  17 g Oral Daily  . 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  Starting Ceftriaxone day 1/21. Culture is below DVT: SCD/Heparin    Plan:   Dr. Ninetta Lights wants to continue IV Rocephin for 3 weeks.  I will order a PICC line for this, Diflucan for probable candida infection.  Go back to a soft diet.  I ordered a film that still isn't done.  I told her to go slow the narcotics and the food.  Hopefully home soon.  I will ask Case Manager to see and start making home arrangements.    LOS: 11 days    Tito Ausmus 04/29/2016 [phone]

## 2016-04-30 MED ORDER — ENSURE ENLIVE PO LIQD
237.0000 mL | Freq: Three times a day (TID) | ORAL | Status: DC
  Administered 2016-04-30 – 2016-05-01 (×4): 237 mL via ORAL

## 2016-04-30 MED ORDER — SIMETHICONE 80 MG PO CHEW
80.0000 mg | CHEWABLE_TABLET | Freq: Four times a day (QID) | ORAL | Status: DC | PRN
  Administered 2016-05-01 (×2): 80 mg via ORAL
  Filled 2016-04-30 (×2): qty 1

## 2016-04-30 MED ORDER — PSYLLIUM 95 % PO PACK
1.0000 | PACK | Freq: Every day | ORAL | Status: DC
  Administered 2016-04-30 – 2016-05-01 (×2): 1 via ORAL
  Filled 2016-04-30 (×2): qty 1

## 2016-04-30 MED ORDER — SODIUM CHLORIDE 0.9% FLUSH: 10.0000 mL | INTRAVENOUS | Status: DC | PRN

## 2016-04-30 MED ORDER — IBUPROFEN 400 MG PO TABS: 600.0000 mg | ORAL_TABLET | Freq: Four times a day (QID) | ORAL | Status: DC | PRN

## 2016-04-30 MED ORDER — KETOROLAC TROMETHAMINE 15 MG/ML IJ SOLN
15.0000 mg | Freq: Four times a day (QID) | INTRAMUSCULAR | Status: DC | PRN
  Administered 2016-04-30 – 2016-05-01 (×3): 15 mg via INTRAVENOUS
  Filled 2016-04-30 (×3): qty 1

## 2016-04-30 NOTE — Progress Notes (Signed)
Peripherally Inserted Central Catheter/Midline Placement  The IV Nurse has discussed with the patient and/or persons authorized to consent for the patient, the purpose of this procedure and the potential benefits and risks involved with this procedure.  The benefits include less needle sticks, lab draws from the catheter and patient may be discharged home with the catheter.  Risks include, but not limited to, infection, bleeding, blood clot (thrombus formation), and puncture of an artery; nerve damage and irregular heat beat.  Alternatives to this procedure were also discussed.  PICC/Midline Placement Documentation        Timmothy Soursewman, [place] Emmick Renee 04/30/2016, 9:32 AM

## 2016-04-30 NOTE — Progress Notes (Signed)
Subjective: Tylenol 4 gram yesterday  Dilaudid 3 mg yesterday and 2 mg this AM so far Oxycodone 70 mg yesterday and 20 mg so far this AM  More stool in the right drain bag, still using allot of pain meds as noted above.  Taking Miralax because she wants her stools very loose.  They are less painful that way.   Objective: Vital signs in last 24 hours: Temp:  [97.5 F (36.4 C)-98.9 F (37.2 C)] 97.5 F (36.4 C) (07/19 0519) Pulse Rate:  [64-94] 64 (07/19 0519) Resp:  [16-19] 16 (07/19 0519) BP: (105-112)/(63-77) 105/63 mmHg (07/19 0519) SpO2:  [98 %-100 %] 100 % (07/19 0519) Last BM Date: 04/30/16 240 PO Drain 2.5 600 stool  Afebrile, VSS No labs this AM Film yesterday shows ongoing mild colonic ileus Intake/Output from previous day: 07/18 0701 - 07/19 0700 In: 2220 [P.O.:240; I.V.:1970] Out: 3452.5 [Urine:2850; Drains:2.5; Stool:600] Intake/Output this shift:    General appearance: alert, cooperative and no distress GI: soft still sore RLQ.  + BS, and BM.  Right drain more feculent material present.  left drain is clear.  Lab Results:   Recent Labs  04/28/16 0409 04/29/16 0413  WBC 14.1* 9.8  HGB 11.5* 11.7*  HCT 35.2* 36.5  PLT 505* 538*    BMET  Recent Labs  04/29/16 0413  NA 136  K 3.9  CL 102  CO2 28  GLUCOSE 91  BUN 6  CREATININE 0.83  CALCIUM 8.8*   PT/INR No results for input(s): LABPROT, INR in the last 72 hours.   Recent Labs Lab 04/24/16 0557  AST 19  ALT 10*  ALKPHOS 71  BILITOT 0.5  PROT 5.1*  ALBUMIN 2.1*     Lipase     Component Value Date/Time   LIPASE 32 04/10/2016 1126     Studies/Results: Ct Abdomen Pelvis W Contrast  04/28/2016  CLINICAL DATA:  Pelvic abscesses. EXAM: CT ABDOMEN AND PELVIS WITH CONTRAST TECHNIQUE: Multidetector CT imaging of the abdomen and pelvis was performed using the standard protocol following bolus administration of intravenous contrast. CONTRAST:  ISOVUE-300 IOPAMIDOL (ISOVUE-300)  INJECTION 61% COMPARISON:  CT scan of April 20, 2016. FINDINGS: Visualized lung bases are unremarkable. No significant osseous abnormality is noted. No gallstones are noted. The liver, spleen and pancreas unremarkable. Nonobstructive right renal calculus is noted. No hydronephrosis or renal obstruction is noted. Adrenal glands are unremarkable. Diffuse colonic dilatation is noted concerning for ileus. The appendix appears normal. No small bowel dilatation is noted. Interval placement of 2 drainage catheters in the pelvis. There is a mild amount of residual fluid seen around the fluid collection in the left adnexal region which is significantly improved compared to prior exam. Fluid collection seen anteriorly in the pelvis appears to be almost completely decompressed. Urinary bladder appears normal. Rectal tube is noted. IMPRESSION: There is been interval placement of 2 drainage catheters into fluid collections of the pelvis, both of which appear to be significantly improved compared to prior exam. Diffuse colonic dilatation is noted most consistent with ileus. Rectal tube is noted. Small nonobstructive right renal calculus. Electronically Signed   By: Lupita Raider, M.D.   On: 04/28/2016 16:11   Dg Abd 2 Views  04/29/2016  CLINICAL DATA:  Ileus.  Abdominal pain.  Tubo-ovarian abscess. EXAM: ABDOMEN - 2 VIEW COMPARISON:  CT on 04/28/2016 FINDINGS: Bilateral percutaneous drainage catheter seen within the pelvis. Mild diffuse colonic dilatation is seen, with residual oral contrast from recent CT. No evidence  of dilated small bowel loops or free intraperitoneal air. IMPRESSION: Mild colonic ileus, without significant change compared to recent CT. Bilateral percutaneous drainage catheter is seen within the pelvis. Electronically Signed   By: Myles RosenthalJohn  Stahl M.D.   On: 04/29/2016 10:44    Medications: . sodium chloride   Intravenous Once  . acetaminophen  1,000 mg Oral Q6H  . cefTRIAXone (ROCEPHIN)  IV  2 g  Intravenous Q24H  . famotidine  20 mg Oral BID  . feeding supplement  1 Container Oral TID BM  . fluconazole  100 mg Oral Daily  . heparin subcutaneous  5,000 Units Subcutaneous Q8H  . polyethylene glycol  17 g Oral Daily  . saccharomyces boulardii  250 mg Oral BID   . 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 Starting Ceftriaxone day 3/21. Diflucan day 2 Culture is below DVT: SCD/Heparin  Plan:  Continue to back down on pain meds, I have stopped the dilaudid and given her IV toradol as back up.  Stop the Miralax, and start her on some fiber.  IR will review and look at taking out the left drain.  Will also need to make arrangements for follow up CT in drain clinic.        LOS: 12 days    Sheila Willis 04/30/2016 [phone]

## 2016-04-30 NOTE — Progress Notes (Signed)
Patient ID: Sheila Willis, female   DOB: [birth year], 30 y.o.   MRN: [id number]    Referring Physician(s): Dr. Claud KelpHaywood Ingram  Supervising Physician: Dr. Fredia SorrowYamagata  Patient Status: inpt  Chief Complaint: Pelvic abscesses  Subjective: Patient with minimal abdominal pain currently, tolerating a regular diet.  Both drains with minimal output according to patient. CT reviewed with Dr. Fredia SorrowYamagata Cx from left site was negative, has been scant output past few days D/W who requests left drain out.  Allergies: Latex  Medications:  Current facility-administered medications:  .  0.9 %  sodium chloride infusion, , Intravenous, Once, Noland FordyceKelly Fogleman, MD .  0.9 %  sodium chloride infusion, , Intravenous, Continuous, Sherrie GeorgeWillard Jennings, PA-C, Last Rate: 50 mL/hr at 04/30/16 1042 .  acetaminophen (TYLENOL) tablet 1,000 mg, 1,000 mg, Oral, Q6H, Sherrie GeorgeWillard Jennings, PA-C, 1,000 mg at 04/30/16 1030 .  cefTRIAXone (ROCEPHIN) 2 g in dextrose 5 % 50 mL IVPB, 2 g, Intravenous, Q24H, Ginnie SmartJeffrey C Hatcher, MD, 2 g at 04/29/16 1743 .  diatrizoate meglumine-sodium (GASTROGRAFIN) 66-10 % solution 30 mL, 30 mL, Oral, PRN, De BlanchLuke Aaron Kinsinger, MD .  famotidine (PEPCID) tablet 20 mg, 20 mg, Oral, BID, Viviano SimasGiang T Le, RPH, 20 mg at 04/30/16 1033 .  feeding supplement (ENSURE ENLIVE) (ENSURE ENLIVE) liquid 237 mL, 237 mL, Oral, TID BM, Sherrie GeorgeWillard Jennings, PA-C .  guaiFENesin (ROBITUSSIN) 100 MG/5ML solution 300 mg, 15 mL, Oral, Q4H PRN, Noland FordyceKelly Fogleman, MD .  heparin injection 5,000 Units, 5,000 Units, Subcutaneous, Q8H, Sherrie GeorgeWillard Jennings, PA-C, 5,000 Units at 04/30/16 [phone] .  ketorolac (TORADOL) 15 MG/ML injection 15 mg, 15 mg, Intravenous, Q6H PRN, Sherrie GeorgeWillard Jennings, PA-C, 15 mg at 04/30/16 1033 .  menthol-cetylpyridinium (CEPACOL) lozenge 3 mg, 1 lozenge, Oral, Q2H PRN, Noland FordyceKelly Fogleman, MD .  ondansetron [facility](ZOFRAN) tablet 4 mg, 4 mg, Oral, Q6H PRN **OR** ondansetron (ZOFRAN) injection 4 mg, 4 mg, Intravenous, Q6H PRN, Noland FordyceKelly  Fogleman, MD, 4 mg at 04/22/16 1941 .  oxyCODONE (Oxy IR/ROXICODONE) immediate release tablet 5-15 mg, 5-15 mg, Oral, Q4H PRN, Sherrie GeorgeWillard Jennings, PA-C, 15 mg at 04/30/16 1030 .  promethazine (PHENERGAN) injection 12.5 mg, 12.5 mg, Intravenous, Q6H PRN, Noland FordyceKelly Fogleman, MD, 12.5 mg at 04/21/16 2136 .  psyllium (HYDROCIL/METAMUCIL) packet 1 packet, 1 packet, Oral, Daily, Sherrie GeorgeWillard Jennings, PA-C .  saccharomyces boulardii (FLORASTOR) capsule 250 mg, 250 mg, Oral, BID, Sherrie GeorgeWillard Jennings, PA-C, 250 mg at 04/30/16 1033 .  sodium chloride flush (NS) 0.9 % injection 10-40 mL, 10-40 mL, Intracatheter, PRN, Sherrie GeorgeWillard Jennings, PA-C .  zolpidem (AMBIEN) tablet 5 mg, 5 mg, Oral, QHS PRN, Noland FordyceKelly Fogleman, MD   Vital Signs: BP 105/63 mmHg  Pulse 64  Temp(Src) 97.5 F (36.4 C) (Oral)  Resp 16  Ht 5\' 2"  (1.575 m)  Wt 134 lb (60.782 kg)  BMI 24.50 kg/m2  SpO2 100%  Physical Exam: Abd: RLQ drain with feculent appearing output, LLQ drain with scant serosanguinous output in the bag currently.  Both sites are c/d/i.   No tenderness to palpation.   Imaging: Ct Abdomen Pelvis W Contrast  04/28/2016  CLINICAL DATA:  Pelvic abscesses. EXAM: CT ABDOMEN AND PELVIS WITH CONTRAST TECHNIQUE: Multidetector CT imaging of the abdomen and pelvis was performed using the standard protocol following bolus administration of intravenous contrast. CONTRAST:  100mL ISOVUE-300 IOPAMIDOL (ISOVUE-300) INJECTION 61% COMPARISON:  CT scan of April 20, 2016. FINDINGS: Visualized lung bases are unremarkable. No significant osseous abnormality is noted. No gallstones are noted. The liver, spleen and pancreas unremarkable. Nonobstructive right renal calculus  is noted. No hydronephrosis or renal obstruction is noted. Adrenal glands are unremarkable. Diffuse colonic dilatation is noted concerning for ileus. The appendix appears normal. No small bowel dilatation is noted. Interval placement of 2 drainage catheters in the pelvis. There is a mild amount  of residual fluid seen around the fluid collection in the left adnexal region which is significantly improved compared to prior exam. Fluid collection seen anteriorly in the pelvis appears to be almost completely decompressed. Urinary bladder appears normal. Rectal tube is noted. IMPRESSION: There is been interval placement of 2 drainage catheters into fluid collections of the pelvis, both of which appear to be significantly improved compared to prior exam. Diffuse colonic dilatation is noted most consistent with ileus. Rectal tube is noted. Small nonobstructive right renal calculus. Electronically Signed   By: Lupita Raider, M.D.   On: 04/28/2016 16:11   Dg Abd 2 Views  04/29/2016  CLINICAL DATA:  Ileus.  Abdominal pain.  Tubo-ovarian abscess. EXAM: ABDOMEN - 2 VIEW COMPARISON:  CT on 04/28/2016 FINDINGS: Bilateral percutaneous drainage catheter seen within the pelvis. Mild diffuse colonic dilatation is seen, with residual oral contrast from recent CT. No evidence of dilated small bowel loops or free intraperitoneal air. IMPRESSION: Mild colonic ileus, without significant change compared to recent CT. Bilateral percutaneous drainage catheter is seen within the pelvis. Electronically Signed   By: Myles Rosenthal M.D.   On: 04/29/2016 10:44    Labs:  CBC:  Recent Labs  04/23/16 0401 04/24/16 0557 04/28/16 0409 04/29/16 0413  WBC 11.0* 10.4 14.1* 9.8  HGB 11.7* 11.2* 11.5* 11.7*  HCT 34.8* 34.0* 35.2* 36.5  PLT 372 370 505* 538*    COAGS:  Recent Labs  04/21/16 0839  INR 1.35    BMP:  Recent Labs  04/21/16 0511 04/22/16 0512 04/24/16 0557 04/29/16 0413  NA 134* 136 136 136  K 3.9 4.2 3.9 3.9  CL 99* 102 104 102  CO2 GLUCOSE 91 94 108* 91  BUN 5* <5* <5* 6  CALCIUM 8.1* 8.1* 8.0* 8.8*  CREATININE 0.80 0.71 0.78 0.83  GFRNONAA >60 >60 >60 >60  GFRAA >60 >60 >60 >60    LIVER FUNCTION TESTS:  Recent Labs  04/20/16 0633 04/21/16 0511 04/22/16 0512  04/24/16 0557  BILITOT 0.7 0.5 0.6 0.5  AST 14* ALT 7* 10* 10* 10*  ALKPHOS 77 77 66 71  PROT 6.2* 5.8* 5.4* 5.1*  ALBUMIN 2.2* 2.2* 2.2* 2.1*    Assessment and Plan: 1. TOA/pelvic abscesses, s/p perc drain on 7/10 in RLQ (colonic fistula), 7/13 LLQ drain placed -patient doing well.   CT shows improvement, pretty much resolved left sided collection. 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  This will need to remain in place until her fistula has healed. Anticipate poss DC tomorrow.  Electronically Signed: Brayton El 04/30/2016, 1:34 PM   I spent a total of 15 Minutes at the the patient's bedside AND on the patient's hospital floor or unit, greater than 50% of which was counseling/coordinating care for pelvic abscesses

## 2016-05-01 ENCOUNTER — Other Ambulatory Visit: Payer: Self-pay | Admitting: Radiology

## 2016-05-01 ENCOUNTER — Encounter: Payer: Self-pay | Admitting: General Surgery

## 2016-05-01 DIAGNOSIS — N7093 Salpingitis and oophoritis, unspecified: Secondary | ICD-10-CM

## 2016-05-01 MED ORDER — DEXTROSE 5 % IV SOLN: 2.0000 g | INTRAVENOUS | Status: DC

## 2016-05-01 MED ORDER — PSYLLIUM 95 % PO PACK
PACK | ORAL | Status: DC
Start: 2016-05-01 — End: 2016-06-05

## 2016-05-01 MED ORDER — IBUPROFEN 200 MG PO TABS
ORAL_TABLET | ORAL | Status: DC
Start: 2016-05-01 — End: 2016-06-05

## 2016-05-01 MED ORDER — FAMOTIDINE 20 MG PO TABS: ORAL_TABLET | ORAL | Status: DC

## 2016-05-01 MED ORDER — OXYCODONE HCL 10 MG PO TABS: 5.0000 mg | ORAL_TABLET | ORAL | Status: DC | PRN

## 2016-05-01 MED ORDER — SACCHAROMYCES BOULARDII 250 MG PO CAPS: ORAL_CAPSULE | ORAL | Status: DC

## 2016-05-01 MED ORDER — ACETAMINOPHEN 325 MG PO TABS: ORAL_TABLET | ORAL | Status: DC

## 2016-05-01 MED ORDER — ALUM & MAG HYDROXIDE-SIMETH 200-200-20 MG/5ML PO SUSP
15.0000 mL | ORAL | Status: AC | PRN
  Administered 2016-05-01: 15 mL via ORAL
  Filled 2016-05-01: qty 30

## 2016-05-01 MED ORDER — HEPARIN SOD (PORK) LOCK FLUSH 100 UNIT/ML IV SOLN
250.0000 [IU] | INTRAVENOUS | Status: AC | PRN
Start: 2016-05-01 — End: 2016-05-01
  Administered 2016-05-01: 250 [IU]

## 2016-05-01 MED ORDER — IBUPROFEN 400 MG PO TABS: 600.0000 mg | ORAL_TABLET | Freq: Four times a day (QID) | ORAL | Status: DC | PRN

## 2016-05-01 NOTE — Progress Notes (Signed)
Subjective: 2 mg IV dilaudid Toradol x 2 65 mf oxycodone She looks very good, her daughter is with her and this is a big issue.  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  We talked about pain issue extensively and use of narcotics, long term and short term effects.  She is ok with going to Ibuprofen as second line and oxycodone as third line pain med. Left drain removed yesterday   Objective: Vital signs in last 24 hours: Temp:  [97.7 F (36.5 C)-98.7 F (37.1 C)] 98.7 F (37.1 C) (07/20 0512) Pulse Rate:  [79-81] 81 (07/20 0512) Resp:  [16] 16 (07/20 0512) BP: (107-113)/(59-75) 110/59 mmHg (07/20 0512) SpO2:  [98 %-100 %] 98 % (07/20 0512) Last BM Date: 04/30/16 240 PO Urine 550 Drain 30 Stool 400 Afebrile, VSS Labs OK yesterday, none today Intake/Output from previous day: 07/19 0701 - 07/20 0700 In: 2080.8 [P.O.:240; I.V.:1780.8; IV Piggyback:50] Out: 980 [Urine:550; Drains:30; Stool:400] Intake/Output this shift:    General appearance: alert, cooperative and no distress Resp: clear to auscultation bilaterally GI: soft, non-tender; bowel sounds normal; no masses,  no organomegaly and on exam she is soft and not tender.  Drain changed to a leg bag, and minimal feculent appearing drainage in the tube.    Lab Results:   Recent Labs  04/29/16 0413  WBC 9.8  HGB 11.7*  HCT 36.5  PLT 538*    BMET  Recent Labs  04/29/16 0413  NA 136  K 3.9  CL 102  CO2 28  GLUCOSE 91  BUN 6  CREATININE 0.83  CALCIUM 8.8*   PT/INR No results for input(s): LABPROT, INR in the last 72 hours.  No results for input(s): AST, ALT, ALKPHOS, BILITOT, PROT, ALBUMIN in the last 168 hours.   Lipase     Component Value Date/Time   LIPASE 32 04/10/2016 1126     Studies/Results: Dg Abd 2 Views  04/29/2016  CLINICAL DATA:  Ileus.  Abdominal pain.  Tubo-ovarian abscess. EXAM: ABDOMEN - 2 VIEW COMPARISON:  CT on 04/28/2016 FINDINGS: Bilateral percutaneous  drainage catheter seen within the pelvis. Mild diffuse colonic dilatation is seen, with residual oral contrast from recent CT. No evidence of dilated small bowel loops or free intraperitoneal air. IMPRESSION: Mild colonic ileus, without significant change compared to recent CT. Bilateral percutaneous drainage catheter is seen within the pelvis. Electronically Signed   By: Myles RosenthalJohn  Stahl M.D.   On: 04/29/2016 10:44    Medications: . sodium chloride   Intravenous Once  . acetaminophen  1,000 mg Oral Q6H  . cefTRIAXone (ROCEPHIN)  IV  2 g Intravenous Q24H  . famotidine  20 mg Oral BID  . feeding supplement (ENSURE ENLIVE)  237 mL Oral TID BM  . heparin subcutaneous  5,000 Units Subcutaneous Q8H  . psyllium  1 packet Oral Daily  . saccharomyces boulardii  250 mg Oral BID   . 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 Starting Ceftriaxone day 4/21.  To be completed on 05/18/16.   Vaginal candida  Day 2 diflucan completed  yesterday DVT: SCD/Heparin   Plan:  Home today, with Home health.  Ceftriaxone thru 05/18/16.  Checking on repeat CT with IR.  Follow up with Dr. Derrell Lolling    LOS: 13 days    Sherrie George 05/01/2016 [phone]

## 2016-05-01 NOTE — Discharge Summary (Signed)
Physician Discharge Summary  Patient ID: ALINA GILKEY MRN: [id number] DOB/AGE: 1986-10-07 30 y.o.  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 Patient notes on an onset of pain on June 29. She went to the ER at that time and had a white count of 16, a negative urine pregnancy test and a CT scan that showed 1.8 cm collapsed left ovarian cyst with some complex fluid around the left ovary that tracks to the cul-de-sac. She had a normal appendix and no other acute pathology. She also had a pelvic ultrasound which showed a normal right ovary a uterus 8 x 4 x 6 cm with a 4 mm endometrial stripe and an IUD in the proper position left adnexal fluid was also noted. She was diagnosed with a probable ruptured left ovarian cyst and sent home for gynecologic follow-up. She had follow-up on July 3 and was still noting acute pelvic pain with some slight worsening. She had used all of the 12 Percocet she was given on the 29th and noted that pain did improve with Percocet. She was not having any vaginal bleeding or vaginal discharge. On that visit she was noted to be tender in the bilateral adnexa with question of cervical motion tenderness. Wet prep showed small amount of white blood cells and bacterial vaginosis. I discussed with the patient the possibility of pelvic inflammatory disease versus pain due tocyst rupture and the decision was to treat for possible PID to prevent possible worsening infection. Cultures for  gonorrhea and Chlamydia were planned but never received by the lab and were not resulted. Patient was given additional Percocet and a shot of Rocephin 250 Meggs IM. She is given doxycycline and Flagyl for oral home treatment but never started these medications.  Patient returned to the office today with worsening pain. Patient notes has worked all week but was having increased pain during work. Percocet continued to take the edge off but not completely relieve the pain. She has also had development of nausea and had emesis today. She does note an increase in vaginal discharge. Patient has been contracepting with an IUD. She has had same partner for the last year but no recent STD screening. Given her worsening pain and possible infection patient had IUD removed in the office today.   She was admitted by Dr. Ernestina Penna with concerns for a pelvic mass and pain.  Her primary concern was a tubo-ovarian abscess.  She had persistent and severe pain.  She was seen by Dr. Luretha Murphy on 04/20/16.  He notes CT shows pelvic abscess with air and raised concerns with colon perforation. It was our initial impression that this was a Tubo-ovarian abscess.  He recommended IR place a drain. Which was done on 04/21/16.  She initially seemed better after the drain was placed, but still requiring a fair amount of IV pain medication. Initial antibiotics started on 04/18/16; clindamycin/gentamycin/Flagyl.  She was seen on 04/22/16 and Dr. Ninetta Lights started her on a 6 day course of Invanz.  She was then transitioned to ceftriaxone for the next 21 days by Dr. Ninetta Lights from ID.   She continued to have pain and repeat CT scan 04/24/16 showing a   a fistula from the collapsed abscess cavity.  A new collection was also found and this area had a drain placed in this area 04/24/16.  She was transferred to [facility] and transferred to [facility] service by Dr. Derrell Lolling on 04/25/16..  The drainage was initially blood, the a brownish red with no odor. By the afternoon  on 7/14 showed the fluid turning more brown on 7/14, but the volume remained low.  The drainage eventually turned feculent in appearance.  Her WBC normalized, she remained afebrile.  We placed a PICC line for her 21 day course of ceftriaxone.  We also weaned down her pain medications and by 05/01/16 it was Dr. Jamse Mead opinion she could go home.  Home health is set up to help her with the remaining right drain.and IV antibiotics via her PICC. Follow up appointments with Dr. Derrell Lolling and repeat CT scan are being arranged.    CBC Latest Ref Rng 04/29/2016 04/28/2016 04/24/2016  WBC 4.0 - 10.5 K/uL 9.8 14.1(H) 10.4  Hemoglobin 12.0 - 15.0 g/dL 11.7(L) 11.5(L) 11.2(L)  Hematocrit 36.0 - 46.0 % 36.5 35.2(L) 34.0(L)  Platelets 150 - 400 K/uL 538(H) 505(H) 370   CMP Latest Ref Rng 04/29/2016 04/24/2016 04/22/2016  Glucose 65 - 99 mg/dL 91 409(W) 94  BUN 6 - 20 mg/dL 6 <1(X) <9(J)  Creatinine 0.44 - 1.00 mg/dL 4.78 2.95 6.21  Sodium 135 - 145 mmol/L 136 136 136  Potassium 3.5 - 5.1 mmol/L 3.9 3.9 4.2  Chloride 101 - 111 mmol/L 102 104 102  CO2 22 - 32 mmol/L Calcium 8.9 - 10.3 mg/dL 3.0(Q) 6.5(H) 8.1(L)  Total Protein 6.5 - 8.1 g/dL - 5.1(L) 5.4(L)  Total Bilirubin 0.3 - 1.2 mg/dL - 0.5 0.6  Alkaline Phos 38 - 126 U/L - 71 66  AST 15 - 41 U/L - 19 25  ALT 14 - 54 U/L - 10(L) 10(L)    Condition on D/c:  Improved     Disposition: 01-Home or Self Care     Medication List    STOP taking these medications        ondansetron 4 MG disintegrating tablet  Commonly known as:  ZOFRAN ODT     oxyCODONE-acetaminophen 5-325 MG tablet  Commonly known as:  PERCOCET/ROXICET      TAKE these medications        acetaminophen 325 MG tablet  Commonly known as:  TYLENOL  Take two tablets every 6 hours as needed for pain.  This is your first line treatment for pain.  This is regular generic Tylenol, you can buy at any store.     cefTRIAXone 2 g in dextrose 5 % 50 mL  Inject 2 g into the vein  daily. Daily dose ending date 05/18/16.     famotidine 20 MG tablet  Commonly known as:  PEPCID  Take one daily as long as your using Ibuprofen.  You can buy this over the counter also.     ibuprofen 200 MG tablet  Commonly known as:  ADVIL,MOTRIN  You can safely take 2-3 tablets every 6 hours for pain as needed.  This is your second line treatment for pain.   Oxycodone is your third choice for pain medications and you should use it last.  You can buy this over the  counter also.     Oxycodone HCl 10 MG Tabs  Take 0.5-1.5 tablets (5-15 mg total) by mouth every 4 (four) hours as needed for moderate pain.     psyllium 95 % Pack  Commonly known as:  HYDROCIL/METAMUCIL  You can take 1-2 packets per day as needed to keep your bowel movements regular and soft.  You should have 1-2 soft Bowel movements per day. You can buy this over the counter.     saccharomyces boulardii 250 MG capsule  Commonly known as:  FLORASTOR  You can buy this at the drug store, and use to replace good bacteria while on antibiotics.  i would use this for at least 2 weeks after you finish your antibiotics.           Follow-up Information    Follow up with [facility].   Why:  home health nurse for IV antibiotic and drain management   Contact information:   [address] [place] [address] [phone]       Follow up with Ernestene MentionINGRAM,HAYWOOD M, MD On 05/08/2016.   Specialty:  General Surgery   Why:  Your tenative appointment is at 12:15 PM, be there 30 minutes early.  if you do not get a confirmation letter by Monday call our office.   Contact information:   [address] [place] [address] [phone]       Follow up with Lendon ColonelFOGLEMAN,KELLY A., MD.   Specialty:  Obstetrics and Gynecology   Why:  Call and set up a follow up with Dr. Ernestina PennaFogleman.   Contact information:   [address] [place] [address] [phone]       Follow up with Follow up for your CT scan On  05/01/2016.   Why:  If you do not have an appointment for follow up CT scan in the Drain clinic by Monday call our office and ask for assistance.      SignedSherrie George: Bunnie Lederman 05/01/2016, 12:51 PM

## 2016-05-01 NOTE — Progress Notes (Signed)
Discharge instructions discussed with patient, verbalized understanding and agreement, prescriptions given to patient 

## 2016-05-01 NOTE — Discharge Instructions (Signed)
Percutaneous Abscess Drain, Care After °Refer to this sheet in the next few weeks. These instructions provide you with information on caring for yourself after your procedure. Your health care provider may also give you more specific instructions. Your treatment has been planned according to current medical practices, but problems sometimes occur. Call your health care provider if you have any problems or questions after your procedure. °WHAT TO EXPECT AFTER THE PROCEDURE °After your procedure, it is typical to have the following:  °· A small amount of discomfort in the area where the drainage tube was placed. °· A small amount of bruising around the area where the drainage tube was placed. °· Sleepiness and fatigue for the rest of the day from the medicines used. °HOME CARE INSTRUCTIONS °· Rest at home for 1-2 days following your procedure or as directed by your health care provider. °· If you go home right after the procedure, plan to have someone with you for 24 hours. °· Do not take a bath or shower for 24 hours after your procedure. °· Take medicines only as directed by your health care provider. Ask your health care provider when you can resume taking any normal medicines. °· Change bandages (dressings) as directed.   °· You may be told to record the amount of drainage from the bag every time you empty it. Follow your health care provider's directions for emptying the bag. Write down the amount of drainage, the date, and the time you emptied it. °· Call your health care provider when the drain is putting out less than 10 mL of drainage per day for 2-3 days in a row or as directed by your health care provider. °· Follow your health care provider's instructions for cleaning the drainage tube. You may need to clean the tube every day so that it does not clog. °SEEK MEDICAL CARE IF: °· You have increased bleeding (more than a small spot) from the site where the drainage tube was placed. °· You have redness,  swelling, or increasing pain around the site where the drainage tube was placed. °· You notice a discharge or bad smell coming from the site where the drainage tube was placed. °· You have a fever or chills.  °· You have pain that is not helped by medicine.   °SEEK IMMEDIATE MEDICAL CARE IF: °· There is leakage around the drainage tube. °· The drainage tube pulls out. °· You suddenly stop having drainage from the tube. °· You suddenly have blood in the drainage fluid. °· You become dizzy or faint. °· You develop a rash.   °· You have nausea or vomiting. °· You have difficulty breathing, feel short of breath, or feel faint.   °· You develop chest pain. °· You have problems with your speech or vision. °· You have trouble balancing or moving your arms or legs. °  °This information is not intended to replace advice given to you by your health care provider. Make sure you discuss any questions you have with your health care provider. °  °Document Released: 02/13/2014 Document Revised: 07/18/2014 Document Reviewed: 02/13/2014 °Elsevier Interactive Patient Education ©2016 [employer] ° °

## 2016-05-09 ENCOUNTER — Other Ambulatory Visit: Payer: Self-pay | Admitting: General Surgery

## 2016-05-09 DIAGNOSIS — N739 Female pelvic inflammatory disease, unspecified: Secondary | ICD-10-CM

## 2016-05-13 ENCOUNTER — Inpatient Hospital Stay (HOSPITAL_COMMUNITY)
Admission: EM | Admit: 2016-05-13 | Discharge: 2016-06-05 | DRG: 330 | Disposition: A | Discharge status: Home Health | Payer: Commercial Managed Care - PPO | Attending: Surgery | Admitting: Surgery

## 2016-05-13 ENCOUNTER — Encounter (HOSPITAL_COMMUNITY): Payer: Self-pay | Admitting: Emergency Medicine

## 2016-05-13 ENCOUNTER — Emergency Department (HOSPITAL_COMMUNITY): Payer: Commercial Managed Care - PPO

## 2016-05-13 DIAGNOSIS — N732 Unspecified parametritis and pelvic cellulitis: Secondary | ICD-10-CM | POA: Diagnosis not present

## 2016-05-13 DIAGNOSIS — T8384XA Pain from genitourinary prosthetic devices, implants and grafts, initial encounter: Secondary | ICD-10-CM | POA: Diagnosis not present

## 2016-05-13 DIAGNOSIS — F411 Generalized anxiety disorder: Secondary | ICD-10-CM | POA: Diagnosis present

## 2016-05-13 DIAGNOSIS — E46 Unspecified protein-calorie malnutrition: Secondary | ICD-10-CM | POA: Diagnosis not present

## 2016-05-13 DIAGNOSIS — T8384XS Pain from genitourinary prosthetic devices, implants and grafts, sequela: Secondary | ICD-10-CM | POA: Diagnosis not present

## 2016-05-13 DIAGNOSIS — Y838 Other surgical procedures as the cause of abnormal reaction of the patient, or of later complication, without mention of misadventure at the time of the procedure: Secondary | ICD-10-CM | POA: Diagnosis not present

## 2016-05-13 DIAGNOSIS — D62 Acute posthemorrhagic anemia: Secondary | ICD-10-CM | POA: Diagnosis not present

## 2016-05-13 DIAGNOSIS — N179 Acute kidney failure, unspecified: Secondary | ICD-10-CM | POA: Diagnosis present

## 2016-05-13 DIAGNOSIS — F329 Major depressive disorder, single episode, unspecified: Secondary | ICD-10-CM | POA: Diagnosis present

## 2016-05-13 DIAGNOSIS — R002 Palpitations: Secondary | ICD-10-CM | POA: Diagnosis not present

## 2016-05-13 DIAGNOSIS — D72829 Elevated white blood cell count, unspecified: Secondary | ICD-10-CM | POA: Diagnosis not present

## 2016-05-13 DIAGNOSIS — N7013 Chronic salpingitis and oophoritis: Secondary | ICD-10-CM | POA: Diagnosis not present

## 2016-05-13 DIAGNOSIS — N739 Female pelvic inflammatory disease, unspecified: Secondary | ICD-10-CM | POA: Diagnosis present

## 2016-05-13 DIAGNOSIS — K567 Ileus, unspecified: Secondary | ICD-10-CM | POA: Diagnosis not present

## 2016-05-13 DIAGNOSIS — B9689 Other specified bacterial agents as the cause of diseases classified elsewhere: Secondary | ICD-10-CM | POA: Diagnosis not present

## 2016-05-13 DIAGNOSIS — Z933 Colostomy status: Secondary | ICD-10-CM

## 2016-05-13 DIAGNOSIS — Z91040 Latex allergy status: Secondary | ICD-10-CM | POA: Diagnosis not present

## 2016-05-13 DIAGNOSIS — Z8632 Personal history of gestational diabetes: Secondary | ICD-10-CM | POA: Diagnosis not present

## 2016-05-13 DIAGNOSIS — G8918 Other acute postprocedural pain: Secondary | ICD-10-CM | POA: Diagnosis not present

## 2016-05-13 DIAGNOSIS — R52 Pain, unspecified: Secondary | ICD-10-CM | POA: Diagnosis not present

## 2016-05-13 DIAGNOSIS — T40605A Adverse effect of unspecified narcotics, initial encounter: Secondary | ICD-10-CM | POA: Diagnosis not present

## 2016-05-13 DIAGNOSIS — Z87891 Personal history of nicotine dependence: Secondary | ICD-10-CM

## 2016-05-13 DIAGNOSIS — B9620 Unspecified Escherichia coli [E. coli] as the cause of diseases classified elsewhere: Secondary | ICD-10-CM | POA: Diagnosis not present

## 2016-05-13 DIAGNOSIS — N136 Pyonephrosis: Secondary | ICD-10-CM

## 2016-05-13 DIAGNOSIS — Z8249 Family history of ischemic heart disease and other diseases of the circulatory system: Secondary | ICD-10-CM

## 2016-05-13 DIAGNOSIS — K9189 Other postprocedural complications and disorders of digestive system: Secondary | ICD-10-CM | POA: Diagnosis not present

## 2016-05-13 DIAGNOSIS — N131 Hydronephrosis with ureteral stricture, not elsewhere classified: Secondary | ICD-10-CM | POA: Diagnosis present

## 2016-05-13 DIAGNOSIS — R531 Weakness: Secondary | ICD-10-CM | POA: Diagnosis present

## 2016-05-13 DIAGNOSIS — R1030 Lower abdominal pain, unspecified: Secondary | ICD-10-CM | POA: Diagnosis not present

## 2016-05-13 DIAGNOSIS — T402X5A Adverse effect of other opioids, initial encounter: Secondary | ICD-10-CM

## 2016-05-13 DIAGNOSIS — N7003 Acute salpingitis and oophoritis: Secondary | ICD-10-CM | POA: Diagnosis not present

## 2016-05-13 DIAGNOSIS — F11988 Opioid use, unspecified with other opioid-induced disorder: Secondary | ICD-10-CM | POA: Diagnosis not present

## 2016-05-13 DIAGNOSIS — E282 Polycystic ovarian syndrome: Secondary | ICD-10-CM | POA: Diagnosis present

## 2016-05-13 DIAGNOSIS — N7093 Salpingitis and oophoritis, unspecified: Secondary | ICD-10-CM | POA: Diagnosis present

## 2016-05-13 DIAGNOSIS — K632 Fistula of intestine: Secondary | ICD-10-CM | POA: Diagnosis present

## 2016-05-13 DIAGNOSIS — K5903 Drug induced constipation: Secondary | ICD-10-CM | POA: Diagnosis not present

## 2016-05-13 DIAGNOSIS — K651 Peritoneal abscess: Secondary | ICD-10-CM | POA: Diagnosis present

## 2016-05-13 DIAGNOSIS — Z823 Family history of stroke: Secondary | ICD-10-CM

## 2016-05-13 DIAGNOSIS — R55 Syncope and collapse: Secondary | ICD-10-CM | POA: Diagnosis not present

## 2016-05-13 DIAGNOSIS — R208 Other disturbances of skin sensation: Secondary | ICD-10-CM | POA: Diagnosis not present

## 2016-05-13 DIAGNOSIS — Z419 Encounter for procedure for purposes other than remedying health state, unspecified: Secondary | ICD-10-CM

## 2016-05-13 DIAGNOSIS — E860 Dehydration: Secondary | ICD-10-CM | POA: Diagnosis not present

## 2016-05-13 DIAGNOSIS — N939 Abnormal uterine and vaginal bleeding, unspecified: Secondary | ICD-10-CM

## 2016-05-13 LAB — URINALYSIS, ROUTINE W REFLEX MICROSCOPIC
BILIRUBIN URINE: NEGATIVE
GLUCOSE, UA: NEGATIVE mg/dL
KETONES UR: NEGATIVE mg/dL
Nitrite: NEGATIVE
PROTEIN: NEGATIVE mg/dL
Specific Gravity, Urine: >1.046 — ABNORMAL HIGH (ref 1.005–1.030)
pH: 6 (ref 5.0–8.0)

## 2016-05-13 LAB — CBC WITH DIFFERENTIAL/PLATELET
BASOS ABS: 0 10*3/uL (ref 0.0–0.1)
Basophils Relative: 0 %
Eosinophils Absolute: 0 10*3/uL (ref 0.0–0.7)
Eosinophils Relative: 0 %
HCT: 38.9 % (ref 36.0–46.0)
HEMOGLOBIN: 12.7 g/dL (ref 12.0–15.0)
LYMPHS ABS: 1.1 10*3/uL (ref 0.7–4.0)
LYMPHS PCT: 6 %
MCH: 30.2 pg (ref 26.0–34.0)
MCHC: 32.6 g/dL (ref 30.0–36.0)
MCV: 92.4 fL (ref 78.0–100.0)
Monocytes Absolute: 0.9 10*3/uL (ref 0.1–1.0)
Monocytes Relative: 5 %
NEUTROS ABS: 17.1 10*3/uL — AB (ref 1.7–7.7)
NEUTROS PCT: 89 %
Platelets: 319 10*3/uL (ref 150–400)
RBC: 4.21 MIL/uL (ref 3.87–5.11)
RDW: 13.1 % (ref 11.5–15.5)
WBC: 19 10*3/uL — AB (ref 4.0–10.5)

## 2016-05-13 LAB — COMPREHENSIVE METABOLIC PANEL
ALK PHOS: 108 U/L (ref 38–126)
ALT: 9 U/L — AB (ref 14–54)
AST: 17 U/L (ref 15–41)
Albumin: 3.5 g/dL (ref 3.5–5.0)
Anion gap: 11 (ref 5–15)
BUN: 7 mg/dL (ref 6–20)
CALCIUM: 9.5 mg/dL (ref 8.9–10.3)
CO2: 23 mmol/L (ref 22–32)
CREATININE: 0.88 mg/dL (ref 0.44–1.00)
Chloride: 103 mmol/L (ref 101–111)
Glucose, Bld: 93 mg/dL (ref 65–99)
Potassium: 3.8 mmol/L (ref 3.5–5.1)
Sodium: 137 mmol/L (ref 135–145)
Total Bilirubin: 0.8 mg/dL (ref 0.3–1.2)
Total Protein: 8.8 g/dL — ABNORMAL HIGH (ref 6.5–8.1)

## 2016-05-13 LAB — LIPASE, BLOOD: LIPASE: 26 U/L (ref 11–51)

## 2016-05-13 LAB — URINE MICROSCOPIC-ADD ON

## 2016-05-13 LAB — PREALBUMIN: PREALBUMIN: 16.4 mg/dL — AB (ref 18–38)

## 2016-05-13 LAB — I-STAT BETA HCG BLOOD, ED (MC, WL, AP ONLY)

## 2016-05-13 LAB — I-STAT TROPONIN, ED: TROPONIN I, POC: 0 ng/mL (ref 0.00–0.08)

## 2016-05-13 LAB — MAGNESIUM: MAGNESIUM: 1.9 mg/dL (ref 1.7–2.4)

## 2016-05-13 LAB — I-STAT CG4 LACTIC ACID, ED: Lactic Acid, Venous: 1.19 mmol/L (ref 0.5–1.9)

## 2016-05-13 MED ORDER — MORPHINE SULFATE (PF) 10 MG/ML IV SOLN
10.0000 mg | Freq: Once | INTRAVENOUS | Status: AC
  Administered 2016-05-13: 10 mg via INTRAVENOUS
  Filled 2016-05-13: qty 1

## 2016-05-13 MED ORDER — DIPHENHYDRAMINE HCL 50 MG/ML IJ SOLN: 25.0000 mg | Freq: Four times a day (QID) | INTRAMUSCULAR | Status: DC | PRN

## 2016-05-13 MED ORDER — HYDROMORPHONE HCL 1 MG/ML IJ SOLN
0.5000 mg | INTRAMUSCULAR | Status: DC | PRN
  Administered 2016-05-13 – 2016-05-15 (×15): 1 mg via INTRAVENOUS
  Filled 2016-05-13 (×15): qty 1

## 2016-05-13 MED ORDER — KCL IN DEXTROSE-NACL 20-5-0.9 MEQ/L-%-% IV SOLN
INTRAVENOUS | Status: DC
  Administered 2016-05-13 – 2016-05-15 (×4): via INTRAVENOUS
  Filled 2016-05-13 (×6): qty 1000

## 2016-05-13 MED ORDER — FAMOTIDINE IN NACL 20-0.9 MG/50ML-% IV SOLN
20.0000 mg | Freq: Two times a day (BID) | INTRAVENOUS | Status: DC
  Administered 2016-05-13 – 2016-05-14 (×4): 20 mg via INTRAVENOUS
  Filled 2016-05-13 (×7): qty 50

## 2016-05-13 MED ORDER — ONDANSETRON HCL 4 MG/2ML IJ SOLN
4.0000 mg | Freq: Four times a day (QID) | INTRAMUSCULAR | Status: DC | PRN
  Administered 2016-05-13 – 2016-05-15 (×3): 4 mg via INTRAVENOUS
  Filled 2016-05-13 (×3): qty 2

## 2016-05-13 MED ORDER — VANCOMYCIN HCL IN DEXTROSE 1-5 GM/200ML-% IV SOLN
1000.0000 mg | Freq: Once | INTRAVENOUS | Status: AC
  Administered 2016-05-13: 1000 mg via INTRAVENOUS
  Filled 2016-05-13: qty 200

## 2016-05-13 MED ORDER — ONDANSETRON 4 MG PO TBDP: 4.0000 mg | ORAL_TABLET | Freq: Four times a day (QID) | ORAL | Status: DC | PRN

## 2016-05-13 MED ORDER — SODIUM CHLORIDE 0.9 % IV BOLUS (SEPSIS)
1000.0000 mL | Freq: Once | INTRAVENOUS | Status: AC
  Administered 2016-05-13: 1000 mL via INTRAVENOUS

## 2016-05-13 MED ORDER — IOPAMIDOL (ISOVUE-370) INJECTION 76%
100.0000 mL | Freq: Once | INTRAVENOUS | Status: AC | PRN
  Administered 2016-05-13: 100 mL via INTRAVENOUS

## 2016-05-13 MED ORDER — PIPERACILLIN-TAZOBACTAM 3.375 G IVPB 30 MIN
3.3750 g | Freq: Once | INTRAVENOUS | Status: AC
  Administered 2016-05-13: 3.375 g via INTRAVENOUS
  Filled 2016-05-13: qty 50

## 2016-05-13 MED ORDER — ONDANSETRON HCL 4 MG/2ML IJ SOLN
4.0000 mg | Freq: Once | INTRAMUSCULAR | Status: AC
  Administered 2016-05-13: 4 mg via INTRAVENOUS
  Filled 2016-05-13: qty 2

## 2016-05-13 MED ORDER — DIPHENHYDRAMINE HCL 25 MG PO CAPS: 25.0000 mg | ORAL_CAPSULE | Freq: Four times a day (QID) | ORAL | Status: DC | PRN

## 2016-05-13 MED ORDER — PROMETHAZINE HCL 25 MG/ML IJ SOLN
12.5000 mg | INTRAMUSCULAR | Status: DC | PRN
  Administered 2016-05-13 – 2016-05-15 (×3): 12.5 mg via INTRAVENOUS
  Filled 2016-05-13 (×3): qty 1

## 2016-05-13 MED ORDER — PIPERACILLIN-TAZOBACTAM 3.375 G IVPB
3.3750 g | Freq: Three times a day (TID) | INTRAVENOUS | Status: DC
  Administered 2016-05-13 – 2016-05-15 (×6): 3.375 g via INTRAVENOUS
  Filled 2016-05-13 (×7): qty 50

## 2016-05-13 MED ORDER — VANCOMYCIN HCL IN DEXTROSE 750-5 MG/150ML-% IV SOLN
750.0000 mg | Freq: Two times a day (BID) | INTRAVENOUS | Status: DC
  Filled 2016-05-13: qty 150

## 2016-05-13 NOTE — H&P (Signed)
Sheila Willis is an 30 y.o. female.   Chief Complaint: Increased weakness and abdominal pain HPI: Patient reports increased abdominal pain starting on 05/11/16 pain is in the lower abdomen and her back some cramping and some vaginal bleeding. She has felt warm when sleeping but no measured fevers. She was hospitalized from 04/18/16 through 05/01/16. Initial diagnosis was tubo-ovarian cyst with ongoing pelvic pain. She was admitted by Dr. Larina Earthly.Initial consult was done by Dr. Johnathan Hausen on 04/20/16. He recommended IR drain placement which was done on 04/21/16. She initially improved a repeat CT scan led to a second drain being placed on the left, 04/25/16.  She was seen in follow up by Dr. Fanny Skates. Initial impression was that of a possible tubo-ovarian's abscess.   The initial drain placement was on 04/21/2016 on the right. A repeat CT scan on 04/24/16, showed the initial fluid collection in the region of the catheter tip was no longer evident the catheter tip appeared quelled very close to the sigmoid colon and the intraluminal tip within the colon could not be excluded. It was a persistent 4.6 x 2.8 cm collection left adnexal area suspicious for residual abscess.  Catheter injection then demonstrated a collapse of the abscess cavity but there was a fistula from the collapsed abscess drain site to the adjacent sigmoid. The retention loop of the draining catheter appeared to be in contact with the sigmoid wall.  A left pelvic CT drain was placed on 713/17, and the patient was transferred from [facility] to [facility] and placed on our service by Dr. Dalbert Batman.  The next CT scan was performed on 04/28/16. There was diffuse colonic dilatation at that time but both drain sites appear to be significantly improved. Notes from 04/28/16 shows drainage from either drain at that time. She was taking large amount of narcotic for pain relief, which we feel led to her ileus.   On 04/29/16 the drainage was  dark on the right and we had some concerns that it was turning feculent at that point. By 7/19 drainage on the right was clearly feculent, although she continued to improve.  There was no fluid from the left drain and it was removed on 04/30/16.   She was seen in consultation by Dr. Bobby Rumpf ID service. He initially started her on a 6 day course of Invanz and then with the cultures transitioned her to ceftriaxone for the next 21 days. A PICC line was placed for home antibiotic treatment with ceftriaxone. By 05/01/16 it was Dr. Lear Ng opinion she go home with the right drain in place;  the left drain was removed 04/30/16, after repeat CT scan showed no fluid at the site.  She was seen on 05/08/16 by Dr. Dalbert Batman, and scheduled for repeat CT scan 05/16/16.She was also seen by Dr. Valentino Saxon on 05/07/2016, and placed on birth control for some vaginal bleeding.  Today she presented to the ED with complaints of generalized weakness every time she was is treated with Rocephin. She notes palpitations and some chest discomfort with Rocephin infusion also. She complains of discomfort at the PICC placement site. She notes her lower left abdomen began hurting more on Saturday 7/30 and it was accompanied by some back pain.  She has had some cramping and a small amount of ongoing vaginal bleeding.  It still persist and she came to the ED with her mother and daughter.   Workup in the ED shows she is afebrile her vital signs are  stable. Labs show her CMP is normal except for protein of 8.8. ALT of 9. WBC is 19,000. CT scans with contrast shows: No evidence of pulmonary embolus. Probable sludge noted in the gallbladder. Drainage catheter in the anterior pelvic fluid collection is noted with no significant fluid remaining around it. Drainage catheter is seen in the left adnexal collection on a prior exam but has been removed. Complex and multiloculated fluid collection is present in this area on the left. It measures 5.1 x  4.4 cm now. There is no evidence of bowel obstruction mild amount of free fluid is seen in the dependent portion of the pelvis no significant adenopathy. We are asked to see.    Past Medical History:  Diagnosis Date  . Depression   . Gestational diabetes   . Headache(784.0)   . PCOS (polycystic ovarian syndrome)     Past Surgical History:  Procedure Laterality Date  . NO PAST SURGERIES      Family History  Problem Relation Age of Onset  . Cancer Other   . Hypertension Other   . Stroke Other   . Heart attack Other   . Heart disease Mother   . Hypertension Mother   . Cancer Mother     ovarian   Social History:  reports that she has quit smoking. Her smoking use included Cigarettes. She smoked 0.50 packs per day. She does not have any smokeless tobacco history on file. She reports that she does not drink alcohol or use drugs.  Allergies:  Allergies  Allergen Reactions  . Latex Itching    Prior to Admission medications   Medication Sig Start Date End Date Taking? Authorizing Provider  acetaminophen (TYLENOL) 325 MG tablet Take two tablets every 6 hours as needed for pain.  This is your first line treatment for pain.  This is regular generic Tylenol, you can buy at any store. 05/01/16  Yes Earnstine Regal, PA-C  alum & mag hydroxide-simeth (MAALOX/MYLANTA) 200-200-20 MG/5ML suspension Take 30 mLs by mouth every 6 (six) hours as needed for indigestion or heartburn.   Yes Historical Provider, MD  cefTRIAXone 2 g in dextrose 5 % 50 mL Inject 2 g into the vein daily. Daily dose ending date 05/18/16. 05/01/16 05/18/16 Yes Earnstine Regal, PA-C  ibuprofen (ADVIL,MOTRIN) 200 MG tablet You can safely take 2-3 tablets every 6 hours for pain as needed.  This is your second line treatment for pain.   Oxycodone is your third choice for pain medications and you should use it last.  You can buy this over the counter also. 05/01/16  Yes Earnstine Regal, PA-C  psyllium (HYDROCIL/METAMUCIL) 95 %  PACK You can take 1-2 packets per day as needed to keep your bowel movements regular and soft.  You should have 1-2 soft Bowel movements per day. You can buy this over the counter. 05/01/16  Yes Earnstine Regal, PA-C  famotidine (PEPCID) 20 MG tablet Take one daily as long as your using Ibuprofen.  You can buy this over the counter also. Patient not taking: Reported on 05/13/2016 05/01/16   Earnstine Regal, PA-C  oxyCODONE 10 MG TABS Take 0.5-1.5 tablets (5-15 mg total) by mouth every 4 (four) hours as needed for moderate pain. 05/01/16   Earnstine Regal, PA-C  saccharomyces boulardii (FLORASTOR) 250 MG capsule You can buy this at the drug store, and use to replace good bacteria while on antibiotics.  i would use this for at least 2 weeks after you finish your antibiotics. Patient not  taking: Reported on 05/13/2016 05/01/16   Earnstine Regal, PA-C     Results for orders placed or performed during the hospital encounter of 05/13/16 (from the past 48 hour(s))  CBC with Differential     Status: Abnormal   Collection Time: 05/13/16  9:53 AM  Result Value Ref Range   WBC 19.0 (H) 4.0 - 10.5 K/uL   RBC 4.21 3.87 - 5.11 MIL/uL   Hemoglobin 12.7 12.0 - 15.0 g/dL   HCT 38.9 36.0 - 46.0 %   MCV 92.4 78.0 - 100.0 fL   MCH 30.2 26.0 - 34.0 pg   MCHC 32.6 30.0 - 36.0 g/dL   RDW 13.1 11.5 - 15.5 %   Platelets 319 150 - 400 K/uL   Neutrophils Relative % 89 %   Neutro Abs 17.1 (H) 1.7 - 7.7 K/uL   Lymphocytes Relative 6 %   Lymphs Abs 1.1 0.7 - 4.0 K/uL   Monocytes Relative 5 %   Monocytes Absolute 0.9 0.1 - 1.0 K/uL   Eosinophils Relative 0 %   Eosinophils Absolute 0.0 0.0 - 0.7 K/uL   Basophils Relative 0 %   Basophils Absolute 0.0 0.0 - 0.1 K/uL  Comprehensive metabolic panel     Status: Abnormal   Collection Time: 05/13/16  9:53 AM  Result Value Ref Range   Sodium 137 135 - 145 mmol/L   Potassium 3.8 3.5 - 5.1 mmol/L   Chloride 103 101 - 111 mmol/L   CO2 23 22 - 32 mmol/L   Glucose, Bld 93  65 - 99 mg/dL   BUN 7 6 - 20 mg/dL   Creatinine, Ser 0.88 0.44 - 1.00 mg/dL   Calcium 9.5 8.9 - 10.3 mg/dL   Total Protein 8.8 (H) 6.5 - 8.1 g/dL   Albumin 3.5 3.5 - 5.0 g/dL   AST 17 15 - 41 U/L   ALT 9 (L) 14 - 54 U/L   Alkaline Phosphatase 108 38 - 126 U/L   Total Bilirubin 0.8 0.3 - 1.2 mg/dL   GFR calc non Af Amer >60 >60 mL/min   GFR calc Af Amer >60 >60 mL/min    Comment: (NOTE) The eGFR has been calculated using the CKD EPI equation. This calculation has not been validated in all clinical situations. eGFR's persistently <60 mL/min signify possible Chronic Kidney Disease.    Anion gap 11 5 - 15  Lipase, blood     Status: None   Collection Time: 05/13/16  9:53 AM  Result Value Ref Range   Lipase 26 11 - 51 U/L  I-Stat Beta hCG blood, ED ([facility], [facility], [facility] only)     Status: None   Collection Time: 05/13/16 10:03 AM  Result Value Ref Range   I-stat hCG, quantitative <5.0 <5 mIU/mL   Comment 3            Comment:   GEST. AGE      CONC.  (mIU/mL)   <=1 WEEK        5 - 50     2 WEEKS       50 - 500     3 WEEKS       100 - 10,000     4 WEEKS     1,000 - 30,000        FEMALE AND NON-PREGNANT FEMALE:     LESS THAN 5 mIU/mL   I-Stat Troponin, ED - 0, 3, 6 hours (not at [facility])     Status: None  Collection Time: 05/13/16 10:03 AM  Result Value Ref Range   Troponin i, poc 0.00 0.00 - 0.08 ng/mL   Comment 3            Comment: Due to the release kinetics of cTnI, a negative result within the first hours of the onset of symptoms does not rule out myocardial infarction with certainty. If myocardial infarction is still suspected, repeat the test at appropriate intervals.   I-Stat CG4 Lactic Acid, ED     Status: None   Collection Time: 05/13/16 10:06 AM  Result Value Ref Range   Lactic Acid, Venous 1.19 0.5 - 1.9 mmol/L  Urinalysis, Routine w reflex microscopic (not at [facility])     Status: Abnormal   Collection Time: 05/13/16 12:54 PM  Result Value Ref Range   Color, Urine YELLOW  YELLOW   APPearance CLEAR CLEAR   Specific Gravity, Urine >1.046 (H) 1.005 - 1.030   pH 6.0 5.0 - 8.0   Glucose, UA NEGATIVE NEGATIVE mg/dL   Hgb urine dipstick LARGE (A) NEGATIVE   Bilirubin Urine NEGATIVE NEGATIVE   Ketones, ur NEGATIVE NEGATIVE mg/dL   Protein, ur NEGATIVE NEGATIVE mg/dL   Nitrite NEGATIVE NEGATIVE   Leukocytes, UA SMALL (A) NEGATIVE  Urine microscopic-add on     Status: Abnormal   Collection Time: 05/13/16 12:54 PM  Result Value Ref Range   Squamous Epithelial / LPF 6-30 (A) NONE SEEN   WBC, UA 0-5 0 - 5 WBC/hpf   RBC / HPF TOO NUMEROUS TO COUNT 0 - 5 RBC/hpf   Bacteria, UA FEW (A) NONE SEEN   Ct Angio Chest Pe W And/or Wo Contrast  Result Date: 05/13/2016 CLINICAL DATA:  Chest pain, pelvic abscess. EXAM: CT ANGIOGRAPHY CHEST CT ABDOMEN AND PELVIS WITH CONTRAST TECHNIQUE: Multidetector CT imaging of the chest was performed using the standard protocol during bolus administration of intravenous contrast. Multiplanar CT image reconstructions and MIPs were obtained to evaluate the vascular anatomy. Multidetector CT imaging of the abdomen and pelvis was performed using the standard protocol during bolus administration of intravenous contrast. CONTRAST:  100 mL of Isovue 370 intravenously. COMPARISON:  CT scan of April 28, 2016. FINDINGS: CTA CHEST FINDINGS No pneumothorax or pleural effusion is noted. No acute pulmonary disease is noted. There is no evidence of pulmonary embolus. There is no evidence of thoracic aortic dissection or aneurysm. Visualized portion of upper abdomen is unremarkable. No mediastinal mass or adenopathy is noted. Right-sided PICC line is noted. No significant osseous abnormality is noted. CT ABDOMEN and PELVIS FINDINGS Probable sludge is noted in the gallbladder. The liver, spleen and pancreas unremarkable. Adrenal glands and kidneys appear normal. No hydronephrosis or renal obstruction is noted. Drainage catheter in anterior pelvic fluid collection is  still noted, with no significant fluid remaining around it. The drainage catheter seen in left adnexal fluid collection on prior exam has been removed. Complex and multiloculated fluid collection measuring 5.1 x 4.4 cm is now noted in this area which is slightly increased compared to prior exam. There is no evidence of bowel obstruction. Mild amount of free fluid is seen in the dependent portion of the pelvis. No significant adenopathy is noted. Review of the MIP images confirms the above findings. IMPRESSION: No evidence of pulmonary embolus. Probable sludge noted in the gallbladder. Drainage catheter in anterior pelvic fluid collection seen on prior exam is unchanged, with no significant fluid around it. Drainage catheter in left adnexal fluid collection noted on prior exam has been  removed in interval, and there is now noted complex multiloculated fluid collection measuring 5.1 x 4.4 cm in this area. Electronically Signed   By: Marijo Conception, M.D.   On: 05/13/2016 12:04   Ct Abdomen Pelvis W Contrast  Result Date: 05/13/2016 CLINICAL DATA:  Chest pain, pelvic abscess. EXAM: CT ANGIOGRAPHY CHEST CT ABDOMEN AND PELVIS WITH CONTRAST TECHNIQUE: Multidetector CT imaging of the chest was performed using the standard protocol during bolus administration of intravenous contrast. Multiplanar CT image reconstructions and MIPs were obtained to evaluate the vascular anatomy. Multidetector CT imaging of the abdomen and pelvis was performed using the standard protocol during bolus administration of intravenous contrast. CONTRAST:  100 mL of Isovue 370 intravenously. COMPARISON:  CT scan of April 28, 2016. FINDINGS: CTA CHEST FINDINGS No pneumothorax or pleural effusion is noted. No acute pulmonary disease is noted. There is no evidence of pulmonary embolus. There is no evidence of thoracic aortic dissection or aneurysm. Visualized portion of upper abdomen is unremarkable. No mediastinal mass or adenopathy is noted.  Right-sided PICC line is noted. No significant osseous abnormality is noted. CT ABDOMEN and PELVIS FINDINGS Probable sludge is noted in the gallbladder. The liver, spleen and pancreas unremarkable. Adrenal glands and kidneys appear normal. No hydronephrosis or renal obstruction is noted. Drainage catheter in anterior pelvic fluid collection is still noted, with no significant fluid remaining around it. The drainage catheter seen in left adnexal fluid collection on prior exam has been removed. Complex and multiloculated fluid collection measuring 5.1 x 4.4 cm is now noted in this area which is slightly increased compared to prior exam. There is no evidence of bowel obstruction. Mild amount of free fluid is seen in the dependent portion of the pelvis. No significant adenopathy is noted. Review of the MIP images confirms the above findings. IMPRESSION: No evidence of pulmonary embolus. Probable sludge noted in the gallbladder. Drainage catheter in anterior pelvic fluid collection seen on prior exam is unchanged, with no significant fluid around it. Drainage catheter in left adnexal fluid collection noted on prior exam has been removed in interval, and there is now noted complex multiloculated fluid collection measuring 5.1 x 4.4 cm in this area. Electronically Signed   By: Marijo Conception, M.D.   On: 05/13/2016 12:04    Review of Systems  Constitutional: Positive for chills and fever (not measure, but having some chills at night, since 05/11/16). Negative for diaphoresis, malaise/fatigue and weight loss.  Eyes: Negative.   Respiratory: Negative for cough, hemoptysis, sputum production, shortness of breath and wheezing.        She notes she's been very sedentary, but no shortness of breath.  Cardiovascular: Positive for chest pain (complained of anterior chest pain,but says it's related to antibiotics) and palpitations (nurse told her her HR was up with abx and to give the antibiotics slower.). Negative for  orthopnea, claudication, leg swelling and PND.  Gastrointestinal: Positive for abdominal pain (She points the left lower quadrant and left femoral area she. She also says the pain goes into her back), heartburn (She's taken 2 bottles of Mylanta since 7/29) and nausea. Negative for vomiting.       She does not have diarrhea or constipation but does report loose stools. One or 2 per day.   Genitourinary: Negative for dysuria, flank pain, frequency, hematuria and urgency.       She notes that when she voids she can see her abdomen moving. She is unaware of any pneumaturia.  Musculoskeletal:  Positive for back pain.  Skin: Negative.   Neurological: Positive for weakness and headaches (with antibiotics).  Endo/Heme/Allergies: Negative.   Psychiatric/Behavioral:       She looks and feels tired. She did not complain of depression or excess anxiety. Her mother is here and is helping care for her daughter.    Blood pressure 98/69, pulse 97, temperature 98.1 F (36.7 C), temperature source Oral, resp. rate 11, height _0  (1.575 m), weight 58.1 kg (128 lb), SpO2 100 %. Physical Exam  Constitutional: She is oriented to person, place, and time.  Thin cachectic female, she appears tired and worn out. She no acute distress.  HENT:  Head: Normocephalic and atraumatic.  Mouth/Throat: Oropharyngeal exudate present.  Eyes: Right eye exhibits no discharge. Left eye exhibits discharge. Scleral icterus is present.  Neck: Normal range of motion. Neck supple. No JVD present. No tracheal deviation present. No thyromegaly present.  Cardiovascular: Normal rate, regular rhythm, normal heart sounds and intact distal pulses.   No murmur heard. Respiratory: Effort normal and breath sounds normal. No respiratory distress. She has no wheezes. She has no rales. She exhibits no tenderness.  GI: Soft. Bowel sounds are normal. She exhibits no distension and no mass. There is tenderness (she is tender in the left lower  quadrant. It also extends down over the femoral artery. It also radiates to her back.). There is no rebound and no guarding.  The drainage from the right drain is feculent in appearance.  Genitourinary:  Genitourinary Comments: She reports some minor ongoing vaginal bleeding.  Musculoskeletal: She exhibits no edema, tenderness or deformity.  Lymphadenopathy:    She has no cervical adenopathy.  Neurological: She is alert and oriented to person, place, and time. No cranial nerve deficit.  Skin: Skin is warm and dry. No rash noted. No erythema. No pallor.  Psychiatric: She has a normal mood and affect. Her behavior is normal. Judgment and thought content normal.     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 ID consult Dr. Bobby Rumpf 04/22/16 Colbert Ewing x 6 days; ceftriaxone therapy initiated 04/28/16 with planned course of 21 days.  Plan: We are going to admit the patient. I'll ask ID and IR to see.  Dr. Zella Richer review the studies with radiology. Further intervention and treatment as indicated.  I will restart DVT prophylaxis after IR evaluation.     Taheera Thomann, PA-C 05/13/2016, 1:18 PM

## 2016-05-13 NOTE — ED Notes (Signed)
Attempted to get second blood cultures but no success.

## 2016-05-13 NOTE — ED Triage Notes (Signed)
Patient was treated for abd abscess and is currently home on IV antibiotics. Patient here today due to increased weakness since Saturday. Patient c/o abd pain and out of pain meds so alternating ibuprofen and tylenol.

## 2016-05-13 NOTE — ED Notes (Signed)
Patient aware we need urine, unable to give a specimien

## 2016-05-13 NOTE — Progress Notes (Signed)
Pharmacy Antibiotic Note  Sheila Willis is a 30 y.o. female with recent history of tubo-ovarian abscess, with IR placement of drain on July 10, CT showing another pelvic abscess on July 13 with left IR drain placement, ID consult with patient in PICC in place and on a 21 day course of Rocephin after a course of Invanz presents 05/13/2016 with concern for worsening abdominal pain and generalized weakness.  Pharmacy has been consulted for vancomycin and Zosyn dosing.  Plan: Vancomycin 1g x 1 in ED then 750 mg IV q12h based on previous admission SCr of 0.83.  Will f/u today's SCr and adjust maintenance dose if needed. Zosyn 3.375g IV q8h (4 hour infusion time).   Height: 5\' 2"  (157.5 cm) Weight: 128 lb (58.1 kg) IBW/kg (Calculated) : 50.1  Temp (24hrs), Avg:98.1 F (36.7 C), Min:98.1 F (36.7 C), Max:98.1 F (36.7 C)  No results for input(s): WBC, CREATININE, LATICACIDVEN, VANCOTROUGH, VANCOPEAK, VANCORANDOM, GENTTROUGH, GENTPEAK, GENTRANDOM, TOBRATROUGH, TOBRAPEAK, TOBRARND, AMIKACINPEAK, AMIKACINTROU, AMIKACIN in the last 168 hours.  Estimated Creatinine Clearance: 77.7 mL/min (by C-G formula based on SCr of 0.83 mg/dL).    Allergies  Allergen Reactions  . Latex Itching    Antimicrobials this admission: 8/1 Vancomycin >>  8/1 Zosyn >>   Dose adjustments this admission: -  Microbiology results: 8/2 BCx: sent  Micro results from previous admission: 7/10 pelvic abscess cx: E.coli (R-ampicillin/Gent/Bactrim, I-Unasyn, S- cefazolin/cefepime/ceftaz/CTX/Cipro/Primxin/Zosyn); no anaerobes 7/13 pelvic abscess: no growth  Thank you for allowing pharmacy to be a part of this patient's care.  Clance Boll 05/13/2016 9:50 AM

## 2016-05-13 NOTE — ED Provider Notes (Addendum)
[facility]-EMERGENCY DEPT Provider Note   CSN: [id number] Arrival date & time: 05/13/16  4098  First Provider Contact:  First MD Initiated Contact with Patient 05/13/16 [phone]        History   Chief Complaint Chief Complaint  Patient presents with  . Abdominal Pain  . Weakness    HPI Sheila Willis is a 30 y.o. female.  HPI   Since Saturday, feel generalized weakness every time give self rocephin, does it in rather than .  Changed PICC dressing yesterday. Has been "feeling the PICC" since Saturday-can feel it in place. Pin inshoulder/chest  Abdomen also began hurting more since Saturday, lower abdomen and lower back.  Cramping pain in front. Vaginal bleeding, small amount, sharp pain in back. Left side feels warm.  Feeling warm with sleeping, have not measured fevers.  No chills.     (IUD removed while in hospital--now bleeding, like menses)    Past Medical History:  Diagnosis Date  . Depression   . Gestational diabetes   . Headache(784.0)   . PCOS (polycystic ovarian syndrome)     Patient Active Problem List   Diagnosis Date Noted  . Intra-abdominal abscess (HCC) 05/13/2016  . TOA (tubo-ovarian abscess) 04/19/2016  . Pelvic pain in female 04/18/2016  . PCOS (polycystic ovarian syndrome) 11/14/2012  . Gestational diabetes mellitus, class A1 11/14/2012  . SVD (spontaneous vaginal delivery) 11/13/2012  . Postpartum care following vaginal delivery (2/1) 11/13/2012    Past Surgical History:  Procedure Laterality Date  . NO PAST SURGERIES      OB History    Gravida Para Term Preterm AB Living   3 2 2   1 2    SAB TAB Ectopic Multiple Live Births       1   2       Home Medications    Prior to Admission medications   Medication Sig Start Date End Date Taking? Authorizing Provider  acetaminophen (TYLENOL) 325 MG tablet Take two tablets every 6 hours as needed for pain.  This is your first line treatment for pain.  This is regular generic Tylenol, you can  buy at any store. 05/01/16  Yes Sherrie George, PA-C  alum & mag hydroxide-simeth (MAALOX/MYLANTA) 200-200-20 MG/5ML suspension Take 30 mLs by mouth every 6 (six) hours as needed for indigestion or heartburn.   Yes Historical Provider, MD  cefTRIAXone 2 g in dextrose 5 % 50 mL Inject 2 g into the vein daily. Daily dose ending date 05/18/16. 05/01/16 05/18/16 Yes Sherrie George, PA-C  ibuprofen (ADVIL,MOTRIN) 200 MG tablet You can safely take 2-3 tablets every 6 hours for pain as needed.  This is your second line treatment for pain.   Oxycodone is your third choice for pain medications and you should use it last.  You can buy this over the counter also. 05/01/16  Yes Sherrie George, PA-C  psyllium (HYDROCIL/METAMUCIL) 95 % PACK You can take 1-2 packets per day as needed to keep your bowel movements regular and soft.  You should have 1-2 soft Bowel movements per day. You can buy this over the counter. 05/01/16  Yes Sherrie George, PA-C  famotidine (PEPCID) 20 MG tablet Take one daily as long as your using Ibuprofen.  You can buy this over the counter also. Patient not taking: Reported on 05/13/2016 05/01/16   Sherrie George, PA-C  oxyCODONE 10 MG TABS Take 0.5-1.5 tablets (5-15 mg total) by mouth every 4 (four) hours as needed for moderate pain. 05/01/16  Sherrie George, PA-C  saccharomyces boulardii (FLORASTOR) 250 MG capsule You can buy this at the drug store, and use to replace good bacteria while on antibiotics.  i would use this for at least 2 weeks after you finish your antibiotics. Patient not taking: Reported on 05/13/2016 05/01/16   Sherrie George, PA-C    Family History Family History  Problem Relation Age of Onset  . Cancer Other   . Hypertension Other   . Stroke Other   . Heart attack Other   . Heart disease Mother   . Hypertension Mother   . Cancer Mother     ovarian    Social History Social History  Substance Use Topics  . Smoking status: Former Smoker    Packs/day: 0.50      Types: Cigarettes    Quit date: 04/11/2016  . Smokeless tobacco: Never Used  . Alcohol use No     Allergies   Latex   Review of Systems Review of Systems  Constitutional: Positive for appetite change (since saturday worse), fatigue and fever (subjective). Negative for chills.  Respiratory: Negative for cough and shortness of breath.   Cardiovascular: Negative for chest pain.  Gastrointestinal: Positive for abdominal pain, diarrhea (loose stool) and nausea. Negative for constipation and vomiting.  Genitourinary: Positive for vaginal bleeding. Negative for dysuria and vaginal discharge.  Musculoskeletal: Positive for back pain.  Skin: Negative for rash.  Neurological: Positive for headaches (since started abx).     Physical Exam Updated Vital Signs BP 104/72 (BP Location: Left Arm)   Pulse 80   Temp 98.4 F (36.9 C) (Oral)   Resp 17   Ht 5\' 2"  (1.575 m)   Wt 128 lb (58.1 kg)   SpO2 99%   BMI 23.41 kg/m   Physical Exam  Constitutional: She is oriented to person, place, and time. She appears well-developed and well-nourished. No distress.  HENT:  Head: Normocephalic and atraumatic.  Eyes: Conjunctivae and EOM are normal.  Neck: Normal range of motion.  Cardiovascular: Normal rate, regular rhythm, normal heart sounds and intact distal pulses.  Exam reveals no gallop and no friction rub.   No murmur heard. Pulmonary/Chest: Effort normal and breath sounds normal. No respiratory distress. She has no wheezes. She has no rales. She exhibits tenderness.  Abdominal: Soft. She exhibits no distension. There is tenderness (left sided). There is no guarding.  Drain placed RLQ  Musculoskeletal: She exhibits no edema or tenderness.  Neurological: She is alert and oriented to person, place, and time.  Skin: Skin is warm and dry. No rash noted. She is not diaphoretic. No erythema.  Nursing note and vitals reviewed.    ED Treatments / Results  Labs (all labs ordered are listed,  but only abnormal results are displayed) Labs Reviewed  CBC WITH DIFFERENTIAL/PLATELET - Abnormal; Notable for the following:       Result Value   WBC 19.0 (*)    Neutro Abs 17.1 (*)    All other components within normal limits  COMPREHENSIVE METABOLIC PANEL - Abnormal; Notable for the following:    Total Protein 8.8 (*)    ALT 9 (*)    All other components within normal limits  URINALYSIS, ROUTINE W REFLEX MICROSCOPIC (NOT AT [facility]) - Abnormal; Notable for the following:    Specific Gravity, Urine >1.046 (*)    Hgb urine dipstick LARGE (*)    Leukocytes, UA SMALL (*)    All other components within normal limits  URINE MICROSCOPIC-ADD ON -  Abnormal; Notable for the following:    Squamous Epithelial / LPF 6-30 (*)    Bacteria, UA FEW (*)    All other components within normal limits  PREALBUMIN - Abnormal; Notable for the following:    Prealbumin 16.4 (*)    All other components within normal limits  CULTURE, BLOOD (ROUTINE X 2)  CULTURE, BLOOD (ROUTINE X 2)  URINE CULTURE  LIPASE, BLOOD  MAGNESIUM  BASIC METABOLIC PANEL  CBC  I-STAT BETA HCG BLOOD, ED ([facility], [facility], [facility] ONLY)  I-STAT CG4 LACTIC ACID, ED  I-STAT TROPOININ, ED    EKG  EKG Interpretation None       Radiology Ct Angio Chest Pe W And/or Wo Contrast  Result Date: 05/13/2016 CLINICAL DATA:  Chest pain, pelvic abscess. EXAM: CT ANGIOGRAPHY CHEST CT ABDOMEN AND PELVIS WITH CONTRAST TECHNIQUE: Multidetector CT imaging of the chest was performed using the standard protocol during bolus administration of intravenous contrast. Multiplanar CT image reconstructions and MIPs were obtained to evaluate the vascular anatomy. Multidetector CT imaging of the abdomen and pelvis was performed using the standard protocol during bolus administration of intravenous contrast. CONTRAST:  100 mL of Isovue 370 intravenously. COMPARISON:  CT scan of April 28, 2016. FINDINGS: CTA CHEST FINDINGS No pneumothorax or pleural effusion is noted. No  acute pulmonary disease is noted. There is no evidence of pulmonary embolus. There is no evidence of thoracic aortic dissection or aneurysm. Visualized portion of upper abdomen is unremarkable. No mediastinal mass or adenopathy is noted. Right-sided PICC line is noted. No significant osseous abnormality is noted. CT ABDOMEN and PELVIS FINDINGS Probable sludge is noted in the gallbladder. The liver, spleen and pancreas unremarkable. Adrenal glands and kidneys appear normal. No hydronephrosis or renal obstruction is noted. Drainage catheter in anterior pelvic fluid collection is still noted, with no significant fluid remaining around it. The drainage catheter seen in left adnexal fluid collection on prior exam has been removed. Complex and multiloculated fluid collection measuring 5.1 x 4.4 cm is now noted in this area which is slightly increased compared to prior exam. There is no evidence of bowel obstruction. Mild amount of free fluid is seen in the dependent portion of the pelvis. No significant adenopathy is noted. Review of the MIP images confirms the above findings. IMPRESSION: No evidence of pulmonary embolus. Probable sludge noted in the gallbladder. Drainage catheter in anterior pelvic fluid collection seen on prior exam is unchanged, with no significant fluid around it. Drainage catheter in left adnexal fluid collection noted on prior exam has been removed in interval, and there is now noted complex multiloculated fluid collection measuring 5.1 x 4.4 cm in this area. Electronically Signed   By: Lupita Raider, M.D.   On: 05/13/2016 12:04   Ct Abdomen Pelvis W Contrast  Result Date: 05/13/2016 CLINICAL DATA:  Chest pain, pelvic abscess. EXAM: CT ANGIOGRAPHY CHEST CT ABDOMEN AND PELVIS WITH CONTRAST TECHNIQUE: Multidetector CT imaging of the chest was performed using the standard protocol during bolus administration of intravenous contrast. Multiplanar CT image reconstructions and MIPs were obtained to  evaluate the vascular anatomy. Multidetector CT imaging of the abdomen and pelvis was performed using the standard protocol during bolus administration of intravenous contrast. CONTRAST:  100 mL of Isovue 370 intravenously. COMPARISON:  CT scan of April 28, 2016. FINDINGS: CTA CHEST FINDINGS No pneumothorax or pleural effusion is noted. No acute pulmonary disease is noted. There is no evidence of pulmonary embolus. There is no evidence of thoracic aortic  dissection or aneurysm. Visualized portion of upper abdomen is unremarkable. No mediastinal mass or adenopathy is noted. Right-sided PICC line is noted. No significant osseous abnormality is noted. CT ABDOMEN and PELVIS FINDINGS Probable sludge is noted in the gallbladder. The liver, spleen and pancreas unremarkable. Adrenal glands and kidneys appear normal. No hydronephrosis or renal obstruction is noted. Drainage catheter in anterior pelvic fluid collection is still noted, with no significant fluid remaining around it. The drainage catheter seen in left adnexal fluid collection on prior exam has been removed. Complex and multiloculated fluid collection measuring 5.1 x 4.4 cm is now noted in this area which is slightly increased compared to prior exam. There is no evidence of bowel obstruction. Mild amount of free fluid is seen in the dependent portion of the pelvis. No significant adenopathy is noted. Review of the MIP images confirms the above findings. IMPRESSION: No evidence of pulmonary embolus. Probable sludge noted in the gallbladder. Drainage catheter in anterior pelvic fluid collection seen on prior exam is unchanged, with no significant fluid around it. Drainage catheter in left adnexal fluid collection noted on prior exam has been removed in interval, and there is now noted complex multiloculated fluid collection measuring 5.1 x 4.4 cm in this area. Electronically Signed   By: Lupita Raider, M.D.   On: 05/13/2016 12:04    Procedures Procedures  (including critical care time)  Medications Ordered in ED Medications  piperacillin-tazobactam (ZOSYN) IVPB 3.375 g (3.375 g Intravenous New Bag/Given 05/13/16 2351)  dextrose 5 % and 0.9 % NaCl with KCl 20 mEq/L infusion ( Intravenous Transfusing/Transfer 05/13/16 1714)  HYDROmorphone (DILAUDID) injection 0.5-1 mg (1 mg Intravenous Given 05/14/16 0047)  diphenhydrAMINE (BENADRYL) capsule 25 mg (not administered)    Or  diphenhydrAMINE (BENADRYL) injection 25 mg (not administered)  ondansetron (ZOFRAN-ODT) disintegrating tablet 4 mg ( Oral See Alternative 05/13/16 1917)    Or  ondansetron (ZOFRAN) injection 4 mg (4 mg Intravenous Given 05/13/16 1917)  famotidine (PEPCID) IVPB 20 mg premix (20 mg Intravenous New Bag/Given 05/13/16 2242)  promethazine (PHENERGAN) injection 12.5 mg (12.5 mg Intravenous Given 05/13/16 2343)  sodium chloride 0.9 % bolus 1,000 mL (0 mLs Intravenous Stopped 05/13/16 1233)  ondansetron (ZOFRAN) injection 4 mg (4 mg Intravenous Given 05/13/16 1002)  Morphine Sulfate (PF) SOLN 10 mg (10 mg Intravenous Given 05/13/16 1002)  vancomycin (VANCOCIN) IVPB 1000 mg/200 mL premix (0 mg Intravenous Stopped 05/13/16 1233)  piperacillin-tazobactam (ZOSYN) IVPB 3.375 g (0 g Intravenous Stopped 05/13/16 1104)  iopamidol (ISOVUE-370) 76 % injection 100 mL (100 mLs Intravenous Contrast Given 05/13/16 1110)  Morphine Sulfate (PF) SOLN 10 mg (10 mg Intravenous Given 05/13/16 1254)     Initial Impression / Assessment and Plan / ED Course  I have reviewed the triage vital signs and the nursing notes.  Pertinent labs & imaging results that were available during my care of the patient were reviewed by me and considered in my medical decision making (see chart for details).  Clinical Course   30 year old female with recent history of tubo-ovarian abscess, with IR placement of drain on July 10, CT showing another pelvic abscess on July 13 with left IR drain placement, ID consult with patient in PICC in place and  on a 21 day course of Rocephin presents with concern for worsening abdominal pain and generalized weakness.   Given concern of worsening symptoms, blood cultures were obtained, and antibiotics were broadened to vanc and zosyn. Pt also with increasing leukocytosis of 16[address]. Patient reported  right arm and chest pain, and CT PE study was done to evaluate for pulmonary embolus, as well as evaluate for proximal DVT and showed no acute findings.  EKG within normal limits, troponin negative. Lactic acid and CMP WNL.   Patient reported worsening abdominal pain and tenderness, and CT abdomen and pelvis was ordered to evaluate for intra-abdominal abscess, and showed R side with drain in place and continued resolution and left side showing new complex fluid collection consistent with abscess.  General Surgery consulted and will admit for repeat IR drainage and care.  Final Clinical Impressions(s) / ED Diagnoses   Final diagnoses:  Intra-abdominal abscess [facility])    New Prescriptions Current Discharge Medication List       Alvira Monday, MD 05/14/16 4665    Alvira Monday, MD 05/14/16 0127

## 2016-05-13 NOTE — Progress Notes (Signed)
Patient upset at time of admission because while in the ED, rounding MD told her she could eat.   Upon arrival to floor, patients diet had not been changed.   ED RN, Verlon Au aware that MD had communicated this to the patient, but MD did not give RN an order.   AT 1800 patient arrived to floor, and diet orders at this time were NPO. Floor RN paged on call MD, but did not receive a page back before the end of shift.   Kitchen closed at 1830, and patient was unable to receive the tray.   RN communicated to patient that RN cannot change diet orders with MD approval, and that she had tried to page the MD/PA x2.

## 2016-05-13 NOTE — Consult Note (Signed)
[facility]  Total days of antibiotics 24               Reason for Consult: intra-abdominal infection    Referring Physician: [facility]  Active Problems:   Intra-abdominal abscess ([place])    HPI: Sheila Willis is a 29 y.o. female  G3 P2 012, nonpregnant patient who presents with acute pelvic pain on June 29 work up revealed leukocytosis white count of 16, a negative urine pregnancy test and a CT scan that showed 1.8 cm collapsed left ovarian cyst with some complex fluid around the left ovary that tracks to the cul-de-sac. She is a patient of Dr. Pamala Hurry, admitted at that time for left tubo-ovarian abscess. She was started on IV abtx as well as left pelvic drain for TOA. Due to right sided lower abd pain, she had repeat imaging that suggested not only TOA but also colonic perforation/fistula thus right sided drain place to drain feculant material in JP drain. Due to resolution of fluid collection on original left side, that drain was removed. She was seen by Dr. Johnnye Sima who recommended to narrow her abtx to ceftriaxone since fluid cx on 7/10 grew ecoli. She was discharged on 7/20 with ceftriaxone x 3 wk and management of right sided drain with repeat imaging in 3-4wk.   She states that she has been feeling poorly over the last few days, now having symptoms of tachycardia, and chest pain, feeling unwell with infusion of ceftriaxone, not relieved by slowing down the infusion. She also noticed left sided pain, warmth to left lower quadrant and nightsweats x 3 days. she comes back to the ED for evaluation. Leukocytosis elevated to 19K with left shift (previously 9K), repeat abd CT shows 5.1 x 4.4cm complex,multiloculated fluid collection on left side. The current indwelling drain on anterior pelvic fluid collection has resolved. She has on going feculant drainage in the right drain. She has several questions whether having colectomy would be better than repeated drains. She had  chest CTA that was negative for PE.   Past Medical History:  Diagnosis Date  . Depression   . Gestational diabetes   . Headache(784.0)   . PCOS (polycystic ovarian syndrome)     Allergies:  Allergies  Allergen Reactions  . Latex Itching    MEDICATIONS:    Social History  Substance Use Topics  . Smoking status: Former Smoker    Packs/day: 0.50    Types: Cigarettes    Quit date: 04/11/2016  . Smokeless tobacco: Never Used  . Alcohol use No    Family History  Problem Relation Age of Onset  . Cancer Other   . Hypertension Other   . Stroke Other   . Heart attack Other   . Heart disease Mother   . Hypertension Mother   . Cancer Mother     ovarian    Review of Systems  Constitutional: positive for nightsweats.Negative for fever, chills, diaphoresis, activity change, appetite change, fatigue and unexpected weight change.  HENT: Negative for congestion, sore throat, rhinorrhea, sneezing, trouble swallowing and sinus pressure.  Eyes: Negative for photophobia and visual disturbance.  Respiratory: Negative for cough, chest tightness, shortness of breath, wheezing and stridor.  Cardiovascular: Negative for chest pain, palpitations and leg swelling.  Gastrointestinal:+ LLQ pain. Negative for nausea, vomiting, abdominal pain, diarrhea, constipation, blood in stool, abdominal distention and anal bleeding.  Genitourinary: Negative for dysuria, hematuria, flank pain and difficulty urinating. +vaginal bleeding Musculoskeletal: Negative for myalgias, back  pain, joint swelling, arthralgias and gait problem.  Skin: Negative for color change, pallor, rash and wound.  Neurological: Negative for dizziness, tremors, weakness and light-headedness.  Hematological: Negative for adenopathy. Does not bruise/bleed easily.  Psychiatric/Behavioral: Negative for behavioral problems, confusion, sleep disturbance, dysphoric mood, decreased concentration and agitation.   OBJECTIVE: Temp:  [98.1 F  (36.7 C)] 98.1 F (36.7 C) (08/01 0843) Pulse Rate:  [96-97] 97 (08/01 1352) Resp:  [11-20] 13 (08/01 1352) BP: (94-110)/(69-77) 94/72 (08/01 1352) SpO2:  [96 %-100 %] 96 % (08/01 1352) Weight:  [128 lb (58.1 kg)] 128 lb (58.1 kg) (08/01 0843) Physical Exam  Constitutional:  oriented to person, place, and time. appears well-developed and well-nourished. No distress.  HENT: /AT, PERRLA, no scleral icterus Mouth/Throat: Oropharynx is clear and moist. No oropharyngeal exudate.  Cardiovascular: Normal rate, regular rhythm and normal heart sounds. Exam reveals no gallop and no friction rub.  No murmur heard.  Pulmonary/Chest: Effort normal and breath sounds normal. No respiratory distress.  has no wheezes.  Neck = supple, no nuchal rigidity Abdominal: Soft. Bowel sounds are normal.  exhibits no distension. LLQ tenderness. RLQ drain has feculant liquid in collection system Lymphadenopathy: no cervical adenopathy. No axillary adenopathy Neurological: alert and oriented to person, place, and time.  Skin: Skin is warm and dry. No rash noted. No erythema.  Psychiatric: a normal mood and affect.  behavior is normal.    LABS: Results for orders placed or performed during the hospital encounter of 05/13/16 (from the past 48 hour(s))  CBC with Differential     Status: Abnormal   Collection Time: 05/13/16  9:53 AM  Result Value Ref Range   WBC 19.0 (H) 4.0 - 10.5 K/uL   RBC 4.21 3.87 - 5.11 MIL/uL   Hemoglobin 12.7 12.0 - 15.0 g/dL   HCT 38.9 36.0 - 46.0 %   MCV 92.4 78.0 - 100.0 fL   MCH 30.2 26.0 - 34.0 pg   MCHC 32.6 30.0 - 36.0 g/dL   RDW 13.1 11.5 - 15.5 %   Platelets 319 150 - 400 K/uL   Neutrophils Relative % 89 %   Neutro Abs 17.1 (H) 1.7 - 7.7 K/uL   Lymphocytes Relative 6 %   Lymphs Abs 1.1 0.7 - 4.0 K/uL   Monocytes Relative 5 %   Monocytes Absolute 0.9 0.1 - 1.0 K/uL   Eosinophils Relative 0 %   Eosinophils Absolute 0.0 0.0 - 0.7 K/uL   Basophils Relative 0 %   Basophils  Absolute 0.0 0.0 - 0.1 K/uL  Comprehensive metabolic panel     Status: Abnormal   Collection Time: 05/13/16  9:53 AM  Result Value Ref Range   Sodium 137 135 - 145 mmol/L   Potassium 3.8 3.5 - 5.1 mmol/L   Chloride 103 101 - 111 mmol/L   CO2 23 22 - 32 mmol/L   Glucose, Bld 93 65 - 99 mg/dL   BUN 7 6 - 20 mg/dL   Creatinine, Ser 0.88 0.44 - 1.00 mg/dL   Calcium 9.5 8.9 - 10.3 mg/dL   Total Protein 8.8 (H) 6.5 - 8.1 g/dL   Albumin 3.5 3.5 - 5.0 g/dL   AST 17 15 - 41 U/L   ALT 9 (L) 14 - 54 U/L   Alkaline Phosphatase 108 38 - 126 U/L   Total Bilirubin 0.8 0.3 - 1.2 mg/dL   GFR calc non Af Amer >60 >60 mL/min   GFR calc Af Amer >60 >60 mL/min  Comment: (NOTE) The eGFR has been calculated using the CKD EPI equation. This calculation has not been validated in all clinical situations. eGFR's persistently <60 mL/min signify possible Chronic Kidney Disease.    Anion gap 11 5 - 15  Lipase, blood     Status: None   Collection Time: 05/13/16  9:53 AM  Result Value Ref Range   Lipase 26 11 - 51 U/L  Blood culture (routine x 2)     Status: None (Preliminary result)   Collection Time: 05/13/16  9:54 AM  Result Value Ref Range   Specimen Description BLOOD RIGHT ANTECUBITAL    Special Requests BOTTLES DRAWN AEROBIC AND ANAEROBIC 5ML    Culture      NO GROWTH < 12 HOURS Performed at [facility]    Report Status PENDING   I-Stat Beta hCG blood, ED ([facility], [facility], [facility] only)     Status: None   Collection Time: 05/13/16 10:03 AM  Result Value Ref Range   I-stat hCG, quantitative <5.0 <5 mIU/mL   Comment 3            Comment:   GEST. AGE      CONC.  (mIU/mL)   <=1 WEEK        5 - 50     2 WEEKS       50 - 500     3 WEEKS       100 - 10,000     4 WEEKS     1,000 - 30,000        FEMALE AND NON-PREGNANT FEMALE:     LESS THAN 5 mIU/mL   I-Stat Troponin, ED - 0, 3, 6 hours (not at [facility])     Status: None   Collection Time: 05/13/16 10:03 AM  Result Value Ref Range   Troponin i, poc 0.00  0.00 - 0.08 ng/mL   Comment 3            Comment: Due to the release kinetics of cTnI, a negative result within the first hours of the onset of symptoms does not rule out myocardial infarction with certainty. If myocardial infarction is still suspected, repeat the test at appropriate intervals.   I-Stat CG4 Lactic Acid, ED     Status: None   Collection Time: 05/13/16 10:06 AM  Result Value Ref Range   Lactic Acid, Venous 1.19 0.5 - 1.9 mmol/L  Blood culture (routine x 2)     Status: None (Preliminary result)   Collection Time: 05/13/16 10:42 AM  Result Value Ref Range   Specimen Description BLOOD LEFT ANTECUBITAL    Special Requests BOTTLES DRAWN AEROBIC AND ANAEROBIC 5CC    Culture      NO GROWTH < 12 HOURS Performed at [facility]    Report Status PENDING   Urinalysis, Routine w reflex microscopic (not at [facility])     Status: Abnormal   Collection Time: 05/13/16 12:54 PM  Result Value Ref Range   Color, Urine YELLOW YELLOW   APPearance CLEAR CLEAR   Specific Gravity, Urine >1.046 (H) 1.005 - 1.030   pH 6.0 5.0 - 8.0   Glucose, UA NEGATIVE NEGATIVE mg/dL   Hgb urine dipstick LARGE (A) NEGATIVE   Bilirubin Urine NEGATIVE NEGATIVE   Ketones, ur NEGATIVE NEGATIVE mg/dL   Protein, ur NEGATIVE NEGATIVE mg/dL   Nitrite NEGATIVE NEGATIVE   Leukocytes, UA SMALL (A) NEGATIVE  Urine microscopic-add on     Status: Abnormal   Collection Time: 05/13/16  12:54 PM  Result Value Ref Range   Squamous Epithelial / LPF 6-30 (A) NONE SEEN   WBC, UA 0-5 0 - 5 WBC/hpf   RBC / HPF TOO NUMEROUS TO COUNT 0 - 5 RBC/hpf   Bacteria, UA FEW (A) NONE SEEN    MICRO: 8/1 blood cx pending IMAGING: Ct Angio Chest Pe W And/or Wo Contrast  Result Date: 05/13/2016 CLINICAL DATA:  Chest pain, pelvic abscess. EXAM: CT ANGIOGRAPHY CHEST CT ABDOMEN AND PELVIS WITH CONTRAST TECHNIQUE: Multidetector CT imaging of the chest was performed using the standard protocol during bolus administration of  intravenous contrast. Multiplanar CT image reconstructions and MIPs were obtained to evaluate the vascular anatomy. Multidetector CT imaging of the abdomen and pelvis was performed using the standard protocol during bolus administration of intravenous contrast. CONTRAST:  100 mL of Isovue 370 intravenously. COMPARISON:  CT scan of April 28, 2016. FINDINGS: CTA CHEST FINDINGS No pneumothorax or pleural effusion is noted. No acute pulmonary disease is noted. There is no evidence of pulmonary embolus. There is no evidence of thoracic aortic dissection or aneurysm. Visualized portion of upper abdomen is unremarkable. No mediastinal mass or adenopathy is noted. Right-sided PICC line is noted. No significant osseous abnormality is noted. CT ABDOMEN and PELVIS FINDINGS Probable sludge is noted in the gallbladder. The liver, spleen and pancreas unremarkable. Adrenal glands and kidneys appear normal. No hydronephrosis or renal obstruction is noted. Drainage catheter in anterior pelvic fluid collection is still noted, with no significant fluid remaining around it. The drainage catheter seen in left adnexal fluid collection on prior exam has been removed. Complex and multiloculated fluid collection measuring 5.1 x 4.4 cm is now noted in this area which is slightly increased compared to prior exam. There is no evidence of bowel obstruction. Mild amount of free fluid is seen in the dependent portion of the pelvis. No significant adenopathy is noted. Review of the MIP images confirms the above findings. IMPRESSION: No evidence of pulmonary embolus. Probable sludge noted in the gallbladder. Drainage catheter in anterior pelvic fluid collection seen on prior exam is unchanged, with no significant fluid around it. Drainage catheter in left adnexal fluid collection noted on prior exam has been removed in interval, and there is now noted complex multiloculated fluid collection measuring 5.1 x 4.4 cm in this area. Electronically Signed    By: Marijo Conception, M.D.   On: 05/13/2016 12:04   Ct Abdomen Pelvis W Contrast  Result Date: 05/13/2016 CLINICAL DATA:  Chest pain, pelvic abscess. EXAM: CT ANGIOGRAPHY CHEST CT ABDOMEN AND PELVIS WITH CONTRAST TECHNIQUE: Multidetector CT imaging of the chest was performed using the standard protocol during bolus administration of intravenous contrast. Multiplanar CT image reconstructions and MIPs were obtained to evaluate the vascular anatomy. Multidetector CT imaging of the abdomen and pelvis was performed using the standard protocol during bolus administration of intravenous contrast. CONTRAST:  100 mL of Isovue 370 intravenously. COMPARISON:  CT scan of April 28, 2016. FINDINGS: CTA CHEST FINDINGS No pneumothorax or pleural effusion is noted. No acute pulmonary disease is noted. There is no evidence of pulmonary embolus. There is no evidence of thoracic aortic dissection or aneurysm. Visualized portion of upper abdomen is unremarkable. No mediastinal mass or adenopathy is noted. Right-sided PICC line is noted. No significant osseous abnormality is noted. CT ABDOMEN and PELVIS FINDINGS Probable sludge is noted in the gallbladder. The liver, spleen and pancreas unremarkable. Adrenal glands and kidneys appear normal. No hydronephrosis or renal obstruction is  noted. Drainage catheter in anterior pelvic fluid collection is still noted, with no significant fluid remaining around it. The drainage catheter seen in left adnexal fluid collection on prior exam has been removed. Complex and multiloculated fluid collection measuring 5.1 x 4.4 cm is now noted in this area which is slightly increased compared to prior exam. There is no evidence of bowel obstruction. Mild amount of free fluid is seen in the dependent portion of the pelvis. No significant adenopathy is noted. Review of the MIP images confirms the above findings. IMPRESSION: No evidence of pulmonary embolus. Probable sludge noted in the gallbladder. Drainage  catheter in anterior pelvic fluid collection seen on prior exam is unchanged, with no significant fluid around it. Drainage catheter in left adnexal fluid collection noted on prior exam has been removed in interval, and there is now noted complex multiloculated fluid collection measuring 5.1 x 4.4 cm in this area. Electronically Signed   By: Marijo Conception, M.D.   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  Currently having palpitations with ceftriaxone  - will change antibiotics to piptazo to cover broadly for intestinal flora  - unclear if she may have picc line infection vs. Intolerance to abtx vs. picc catheter irritating heart. Recommend to get plan cxr to see position of picc line. PE has been ruled out. She does not endorse fevers with infusion thus wondering if this is more intolerance of ceftriaxone. No peripheral eosinophilia to suggest allergy  - if she has similar symptoms with piptazo infusion, recommend to discontinue picc line vs pulling out picc line slightly  - if blood cx are positive ,would recommend to discontinue picc line  - discussed with [facility] team whether repeat aspiration vs. Drain needed to sample new fluid collection. Will defer to their judgment if patient is better candidate for surgical intervention  - leukocytosis = likely to underlying infection. Likely to trend down with source control  - vaginal bleeding = likely having her menstrual cycle.

## 2016-05-13 NOTE — Progress Notes (Signed)
Pt with [facility] ED visits x 2  Pt being followed by Surgery for admission ED CM contacted Clydie Braun of [facility] to follow pt for d/c needs since pt noted per admission RN to be active with [facility]

## 2016-05-13 NOTE — ED Notes (Addendum)
Pt can go to floor at 17:18 by Verlon Au.

## 2016-05-14 ENCOUNTER — Other Ambulatory Visit: Payer: Self-pay | Admitting: Urology

## 2016-05-14 ENCOUNTER — Inpatient Hospital Stay (HOSPITAL_COMMUNITY): Payer: Commercial Managed Care - PPO

## 2016-05-14 LAB — BASIC METABOLIC PANEL
ANION GAP: 5 (ref 5–15)
CHLORIDE: 107 mmol/L (ref 101–111)
CO2: 26 mmol/L (ref 22–32)
Calcium: 8.8 mg/dL — ABNORMAL LOW (ref 8.9–10.3)
Creatinine, Ser: 0.78 mg/dL (ref 0.44–1.00)
GFR calc Af Amer: >60 mL/min (ref >60)
GFR calc non Af Amer: >60 mL/min (ref >60)
Glucose, Bld: 101 mg/dL — ABNORMAL HIGH (ref 65–99)
POTASSIUM: 3.8 mmol/L (ref 3.5–5.1)
SODIUM: 138 mmol/L (ref 135–145)

## 2016-05-14 LAB — CBC
HEMATOCRIT: 30.9 % — AB (ref 36.0–46.0)
HEMOGLOBIN: 10.1 g/dL — AB (ref 12.0–15.0)
MCH: 30.3 pg (ref 26.0–34.0)
MCHC: 32.7 g/dL (ref 30.0–36.0)
MCV: 92.8 fL (ref 78.0–100.0)
Platelets: 279 10*3/uL (ref 150–400)
RBC: 3.33 MIL/uL — AB (ref 3.87–5.11)
RDW: 13.5 % (ref 11.5–15.5)
WBC: 12.2 10*3/uL — AB (ref 4.0–10.5)

## 2016-05-14 LAB — URINE CULTURE

## 2016-05-14 MED ORDER — DEXTROSE 5 % IV SOLN
2.0000 g | INTRAVENOUS | Status: AC
  Administered 2016-05-15: 2 g via INTRAVENOUS
  Filled 2016-05-14: qty 2

## 2016-05-14 MED ORDER — SODIUM CHLORIDE 0.9% FLUSH: 10.0000 mL | INTRAVENOUS | Status: DC | PRN

## 2016-05-14 MED ORDER — BOOST / RESOURCE BREEZE PO LIQD
1.0000 | Freq: Three times a day (TID) | ORAL | Status: DC
  Administered 2016-05-14 (×2): 1 via ORAL

## 2016-05-14 MED ORDER — DEXTROSE 5 % IV SOLN
2.0000 g | INTRAVENOUS | Status: DC
  Filled 2016-05-14: qty 2

## 2016-05-14 MED ORDER — HEPARIN SODIUM (PORCINE) 5000 UNIT/ML IJ SOLN
5000.0000 [IU] | Freq: Three times a day (TID) | INTRAMUSCULAR | Status: DC
  Administered 2016-05-14 – 2016-05-15 (×3): 5000 [IU] via SUBCUTANEOUS
  Filled 2016-05-14 (×4): qty 1

## 2016-05-14 NOTE — Consult Note (Signed)
WOC Nurse requested for preoperative stoma site marking  Discussed surgical procedure and stoma creation with patient and family.  Explained role of the WOC nurse team.   Answered patient and family questions. Patient tearful and states she hopes she does not get the ostomy.  Examined patient lying, sitting, and standing in order to place the marking in the patient's visual field, away from any creases or abdominal contour issues and within the rectus muscle.  Attempted to mark below the patient's belt line.   Marked for colostomy in the LLQ  6cm to the left of the umbilicus and 2 cm below the umbilicus.  Patient's abdomen cleansed with CHG wipes at site marking, allowed to air dry prior to marking. Covered mark with thin film transparent dressing to preserve mark until operative procedure.   WOC Nurse team will follow up with patient after surgery for continued ostomy care and teaching should a stoma be created intraoperatively.   Thanks, Ladona Mow, MSN, RN, GNP, [place]  Pager# [phone]

## 2016-05-14 NOTE — Progress Notes (Signed)
IV nurse was consulted for morning blood draw. Primary RN was notified to obtain an updated chest x-ray order to verify PICC tip placement before use.

## 2016-05-14 NOTE — Progress Notes (Signed)
Subjective: Patient feels better this a.m. She has ongoing feculent drainage from the right. She complains of ongoing discomfort on the left side.  Objective: Vital signs in last 24 hours: Temp:  [97.9 F (36.6 C)-100.8 F (38.2 C)] 98.2 F (36.8 C) (08/02 0623) Pulse Rate:  [71-100] 77 (08/02 0623) Resp:  [11-20] 17 (08/02 0623) BP: (94-110)/(63-77) 110/74 (08/02 0623) SpO2:  [96 %-100 %] 100 % (08/02 0623) Weight:  [58.1 kg (128 lb)] 58.1 kg (128 lb) (08/01 0843) Last BM Date: 05/12/16 1200 IV/480 by mouth Currently nothing by mouth MAXIMUM TEMPERATURE in the ED at 1500 hrs: 100, afebrile since that time. Pre-albumin 16.4 Electrolytes are normal White count is down to 12.2 H/H down 2 g with hydration. Urinalysis on admission showed ongoing hematuria. 0-5 WBC/HPF Intake/Output from previous day: 08/01 0701 - 08/02 0700 In: 1210 [P.O.:480; I.V.:675; IV Piggyback:50] Out: 1058 [Urine:1050; Drains:8] Intake/Output this shift: No intake/output data recorded.  General appearance: alert, cooperative and no distress Resp: clear to auscultation bilaterally GI: Soft, the main tenderness is in the left lower quadrant area. She is not distended. She does not have peritonitis. Positive bowel sounds. Ongoing feculent drainage from the right catheter.  Lab Results:   Recent Labs  05/13/16 0953 05/14/16 0554  WBC 19.0* 12.2*  HGB 12.7 10.1*  HCT 38.9 30.9*  PLT 319 279    BMET  Recent Labs  05/13/16 0953 05/14/16 0554  NA 137 138  K 3.8 3.8  CL 103 107  CO2 23 26  GLUCOSE 93 101*  BUN 7 <5*  CREATININE 0.88 0.78  CALCIUM 9.5 8.8*   PT/INR No results for input(s): LABPROT, INR in the last 72 hours.   Recent Labs Lab 05/13/16 0953  AST 17  ALT 9*  ALKPHOS 108  BILITOT 0.8  PROT 8.8*  ALBUMIN 3.5     Lipase     Component Value Date/Time   LIPASE 26 05/13/2016 0953     Studies/Results: Ct Angio Chest Pe W And/or Wo Contrast  Result Date:  05/13/2016 CLINICAL DATA:  Chest pain, pelvic abscess. EXAM: CT ANGIOGRAPHY CHEST CT ABDOMEN AND PELVIS WITH CONTRAST TECHNIQUE: Multidetector CT imaging of the chest was performed using the standard protocol during bolus administration of intravenous contrast. Multiplanar CT image reconstructions and MIPs were obtained to evaluate the vascular anatomy. Multidetector CT imaging of the abdomen and pelvis was performed using the standard protocol during bolus administration of intravenous contrast. CONTRAST:  100 mL of Isovue 370 intravenously. COMPARISON:  CT scan of April 28, 2016. FINDINGS: CTA CHEST FINDINGS No pneumothorax or pleural effusion is noted. No acute pulmonary disease is noted. There is no evidence of pulmonary embolus. There is no evidence of thoracic aortic dissection or aneurysm. Visualized portion of upper abdomen is unremarkable. No mediastinal mass or adenopathy is noted. Right-sided PICC line is noted. No significant osseous abnormality is noted. CT ABDOMEN and PELVIS FINDINGS Probable sludge is noted in the gallbladder. The liver, spleen and pancreas unremarkable. Adrenal glands and kidneys appear normal. No hydronephrosis or renal obstruction is noted. Drainage catheter in anterior pelvic fluid collection is still noted, with no significant fluid remaining around it. The drainage catheter seen in left adnexal fluid collection on prior exam has been removed. Complex and multiloculated fluid collection measuring 5.1 x 4.4 cm is now noted in this area which is slightly increased compared to prior exam. There is no evidence of bowel obstruction. Mild amount of free fluid is seen in the dependent  portion of the pelvis. No significant adenopathy is noted. Review of the MIP images confirms the above findings. IMPRESSION: No evidence of pulmonary embolus. Probable sludge noted in the gallbladder. Drainage catheter in anterior pelvic fluid collection seen on prior exam is unchanged, with no significant  fluid around it. Drainage catheter in left adnexal fluid collection noted on prior exam has been removed in interval, and there is now noted complex multiloculated fluid collection measuring 5.1 x 4.4 cm in this area. Electronically Signed   By: Lupita Raider, M.D.   On: 05/13/2016 12:04   Ct Abdomen Pelvis W Contrast  Result Date: 05/13/2016 CLINICAL DATA:  Chest pain, pelvic abscess. EXAM: CT ANGIOGRAPHY CHEST CT ABDOMEN AND PELVIS WITH CONTRAST TECHNIQUE: Multidetector CT imaging of the chest was performed using the standard protocol during bolus administration of intravenous contrast. Multiplanar CT image reconstructions and MIPs were obtained to evaluate the vascular anatomy. Multidetector CT imaging of the abdomen and pelvis was performed using the standard protocol during bolus administration of intravenous contrast. CONTRAST:  100 mL of Isovue 370 intravenously. COMPARISON:  CT scan of April 28, 2016. FINDINGS: CTA CHEST FINDINGS No pneumothorax or pleural effusion is noted. No acute pulmonary disease is noted. There is no evidence of pulmonary embolus. There is no evidence of thoracic aortic dissection or aneurysm. Visualized portion of upper abdomen is unremarkable. No mediastinal mass or adenopathy is noted. Right-sided PICC line is noted. No significant osseous abnormality is noted. CT ABDOMEN and PELVIS FINDINGS Probable sludge is noted in the gallbladder. The liver, spleen and pancreas unremarkable. Adrenal glands and kidneys appear normal. No hydronephrosis or renal obstruction is noted. Drainage catheter in anterior pelvic fluid collection is still noted, with no significant fluid remaining around it. The drainage catheter seen in left adnexal fluid collection on prior exam has been removed. Complex and multiloculated fluid collection measuring 5.1 x 4.4 cm is now noted in this area which is slightly increased compared to prior exam. There is no evidence of bowel obstruction. Mild amount of free  fluid is seen in the dependent portion of the pelvis. No significant adenopathy is noted. Review of the MIP images confirms the above findings. IMPRESSION: No evidence of pulmonary embolus. Probable sludge noted in the gallbladder. Drainage catheter in anterior pelvic fluid collection seen on prior exam is unchanged, with no significant fluid around it. Drainage catheter in left adnexal fluid collection noted on prior exam has been removed in interval, and there is now noted complex multiloculated fluid collection measuring 5.1 x 4.4 cm in this area. Electronically Signed   By: Lupita Raider, M.D.   On: 05/13/2016 12:04   Dg Chest Port 1 View  Result Date: 05/14/2016 CLINICAL DATA:  30 year old female with PICC placement. EXAM: PORTABLE CHEST 1 VIEW COMPARISON:  Chest CT dated 05/13/2016 FINDINGS: Right-sided PICC with tip over central SVC. No pneumothorax. The lungs are clear. No pleural effusion. The cardiac silhouette is within normal limits. No acute osseous pathology. IMPRESSION: Right-sided PICC with tip over central SVC.  No pneumothorax. Electronically Signed   By: Elgie Collard M.D.   On: 05/14/2016 05:43   . dextrose 5 % and 0.9 % NaCl with KCl 20 mEq/L 125 mL/hr at 05/14/16 1610   Medications: . famotidine (PEPCID) IV  20 mg Intravenous Q12H  . 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 ID consult Dr. Johny Sax 04/22/16 [name] x 6 days; ceftriaxone therapy initiated 04/28/16 with planned course of 21 days. FEN: NPO/IV fluidsID: day 2 Zosyn DVT:  SCD now/adding heparin  Plan:  Keep her nothing by mouth for now. IR did not feel drainage  was going to be useful in this case. Will make further recommendations after discussion with Dr. Abbey Chatters. She will like to go ahead with plans for surgery.   LOS: 1 day    Mcclellan Demarais 05/14/2016 [id number]

## 2016-05-14 NOTE — Progress Notes (Signed)
[facility]  Patient Status: Active (receiving services up to time of hospitalization)  [facility] is providing the following services: RN and Home Infusion Services (teaching and education will be done by nurse in the home with patient and caregiver)  If patient discharges after hours, please call [phone].   Sheila Willis 05/14/2016, 9:28 AM

## 2016-05-14 NOTE — Progress Notes (Signed)
Initial Nutrition Assessment  DOCUMENTATION CODES:   Not applicable  INTERVENTION:  -Boost Breeze po TID, each supplement provides 250 kcal and 9 grams of protein -RD to continue to monitor  NUTRITION DIAGNOSIS:   Inadequate oral intake related to poor appetite, other (see comment) (Pain) as evidenced by per patient/family report.  GOAL:   Patient will meet greater than or equal to 90% of their needs  MONITOR:   PO intake, I & O's, Labs, Weight trends, Supplement acceptance  REASON FOR ASSESSMENT:   Malnutrition Screening Tool    ASSESSMENT:   Patient reports increased abdominal pain starting on 05/11/16 pain is in the lower abdomen and her back some cramping and some vaginal bleeding. She has felt warm when sleeping but no measured fevers.  Spoke with Ms. Stith, mother at bedside. She endorses "fair" PO intake for 10 days after discharge, states " I just laid on the couch." Per chart review  She exhibits a 6#/4.4% insignificant wt loss in 1 month. Had full liquids today, consumed 50% per documented PO intake. Pt states "I tried everything." States appetite is "fair." Encouraged pt to increase PO intake to facilitate healing. Shceduled to undergo exp lap, drainage of new abd abscess, LSO, and poss Small bowel resection with colostomy tomorrow. Will follow up afterwards for PO intake, healing.  Nutrition-Focused physical exam completed. Findings are no fat depletion, no muscle depletion, and no edema.   Labs and Medications reviewed: D5 @ 142mL/hr --> 510 calories Phenergan PRN  Diet Order:  Diet full liquid Room service appropriate? Yes; Fluid consistency: Thin Diet NPO time specified  Skin:  Reviewed, no issues  Last BM:  8/1  Height:   Ht Readings from Last 1 Encounters:  05/13/16 5\' 2"  (1.575 m)    Weight:   Wt Readings from Last 1 Encounters:  05/13/16 128 lb (58.1 kg)    Ideal Body Weight:  50 kg  BMI:  Body mass index is 23.41 kg/m.  Estimated  Nutritional Needs:   Kcal:  1500-1800 calories  Protein:  60-75 grams  Fluid:  >/= 1.5L  EDUCATION NEEDS:   No education needs identified at this time  Dionne Ano. Verdell Dykman, MS, RD LDN Inpatient Clinical Dietitian Pager [phone]

## 2016-05-15 ENCOUNTER — Encounter (HOSPITAL_COMMUNITY): Payer: Self-pay | Admitting: Anesthesiology

## 2016-05-15 ENCOUNTER — Inpatient Hospital Stay (HOSPITAL_COMMUNITY): Payer: Commercial Managed Care - PPO

## 2016-05-15 ENCOUNTER — Inpatient Hospital Stay (HOSPITAL_COMMUNITY): Payer: Commercial Managed Care - PPO | Admitting: Anesthesiology

## 2016-05-15 ENCOUNTER — Encounter (HOSPITAL_COMMUNITY): Admission: EM | Disposition: A | Discharge status: Home Health | Payer: Self-pay | Source: Home / Self Care

## 2016-05-15 ENCOUNTER — Other Ambulatory Visit: Payer: Commercial Managed Care - PPO

## 2016-05-15 DIAGNOSIS — B9689 Other specified bacterial agents as the cause of diseases classified elsewhere: Secondary | ICD-10-CM

## 2016-05-15 DIAGNOSIS — N7013 Chronic salpingitis and oophoritis: Secondary | ICD-10-CM

## 2016-05-15 DIAGNOSIS — N739 Female pelvic inflammatory disease, unspecified: Secondary | ICD-10-CM | POA: Diagnosis present

## 2016-05-15 HISTORY — PX: COLECTOMY WITH COLOSTOMY CREATION/HARTMANN PROCEDURE: SHX6598

## 2016-05-15 HISTORY — PX: INCISION AND DRAINAGE ABSCESS: SHX5864

## 2016-05-15 HISTORY — PX: APPENDECTOMY: SHX54

## 2016-05-15 HISTORY — PX: LAPAROTOMY: SHX154

## 2016-05-15 HISTORY — PX: CYSTOSCOPY W/ URETERAL STENT PLACEMENT: SHX1429

## 2016-05-15 LAB — TYPE AND SCREEN
ABO/RH(D): B POS
Antibody Screen: NEGATIVE

## 2016-05-15 LAB — APTT: APTT: 39 s — AB (ref 24–36)

## 2016-05-15 LAB — ABO/RH: ABO/RH(D): B POS

## 2016-05-15 LAB — PROTIME-INR
INR: 1.18
PROTHROMBIN TIME: 15.1 s (ref 11.4–15.2)

## 2016-05-15 SURGERY — LAPAROTOMY, EXPLORATORY
Anesthesia: General

## 2016-05-15 MED ORDER — KETOROLAC TROMETHAMINE 15 MG/ML IJ SOLN
15.0000 mg | Freq: Four times a day (QID) | INTRAMUSCULAR | Status: DC | PRN
  Administered 2016-05-15 – 2016-05-19 (×10): 30 mg via INTRAVENOUS
  Filled 2016-05-15 (×10): qty 2

## 2016-05-15 MED ORDER — HYDROMORPHONE HCL 1 MG/ML IJ SOLN
0.2500 mg | INTRAMUSCULAR | Status: DC | PRN
  Administered 2016-05-15 (×4): 0.5 mg via INTRAVENOUS

## 2016-05-15 MED ORDER — KETOROLAC TROMETHAMINE 30 MG/ML IJ SOLN: 30.0000 mg | Freq: Once | INTRAMUSCULAR | Status: DC | PRN

## 2016-05-15 MED ORDER — MORPHINE SULFATE 2 MG/ML IV SOLN
INTRAVENOUS | Status: DC
  Administered 2016-05-15: 15:00:00 via INTRAVENOUS
  Filled 2016-05-15: qty 25

## 2016-05-15 MED ORDER — PHENOL 1.4 % MT LIQD: 2.0000 | OROMUCOSAL | Status: DC | PRN

## 2016-05-15 MED ORDER — LACTATED RINGERS IV BOLUS (SEPSIS): 1000.0000 mL | Freq: Three times a day (TID) | INTRAVENOUS | Status: AC | PRN

## 2016-05-15 MED ORDER — LIDOCAINE HCL (CARDIAC) 20 MG/ML IV SOLN
INTRAVENOUS | Status: AC
  Filled 2016-05-15: qty 5

## 2016-05-15 MED ORDER — SUCCINYLCHOLINE CHLORIDE 20 MG/ML IJ SOLN
INTRAMUSCULAR | Status: DC | PRN
  Administered 2016-05-15: 100 mg via INTRAVENOUS

## 2016-05-15 MED ORDER — SODIUM CHLORIDE 0.9 % IJ SOLN
INTRAMUSCULAR | Status: AC
  Filled 2016-05-15: qty 10

## 2016-05-15 MED ORDER — HYDROMORPHONE 1 MG/ML IV SOLN
INTRAVENOUS | Status: DC
  Administered 2016-05-15: 17:00:00 via INTRAVENOUS
  Administered 2016-05-15: 2.4 mg via INTRAVENOUS
  Administered 2016-05-16: 4.8 mg via INTRAVENOUS
  Administered 2016-05-16: 3.9 mg via INTRAVENOUS
  Administered 2016-05-16: 2.1 mg via INTRAVENOUS
  Administered 2016-05-16: 1.2 mg via INTRAVENOUS
  Administered 2016-05-17: 3.3 mg via INTRAVENOUS
  Administered 2016-05-17: 1.8 mg via INTRAVENOUS
  Administered 2016-05-17: 2.4 mg via INTRAVENOUS
  Administered 2016-05-17: 3 mg via INTRAVENOUS
  Administered 2016-05-17: 1.2 mg via INTRAVENOUS
  Administered 2016-05-17: 3.3 mg via INTRAVENOUS
  Administered 2016-05-18: 0.9 mg via INTRAVENOUS
  Administered 2016-05-18: 1.8 mg via INTRAVENOUS
  Administered 2016-05-18: 12:00:00 via INTRAVENOUS
  Administered 2016-05-19: 1.8 mg via INTRAVENOUS
  Administered 2016-05-19: 3 mg via INTRAVENOUS
  Administered 2016-05-19: 2.1 mg via INTRAVENOUS
  Administered 2016-05-19: 2.4 mg via INTRAVENOUS
  Administered 2016-05-19: 1.5 mg via INTRAVENOUS
  Administered 2016-05-19: 1.2 mg via INTRAVENOUS
  Administered 2016-05-20: 0.6 mg via INTRAVENOUS
  Administered 2016-05-20: 1.5 mg via INTRAVENOUS
  Administered 2016-05-20: 11:00:00 via INTRAVENOUS
  Administered 2016-05-20: 3 mg via INTRAVENOUS
  Filled 2016-05-15 (×4): qty 25

## 2016-05-15 MED ORDER — HYDROMORPHONE HCL 1 MG/ML IJ SOLN
INTRAMUSCULAR | Status: DC | PRN
  Administered 2016-05-15 (×4): 0.5 mg via INTRAVENOUS

## 2016-05-15 MED ORDER — HYDROMORPHONE HCL 1 MG/ML IJ SOLN
INTRAMUSCULAR | Status: AC
  Filled 2016-05-15: qty 1

## 2016-05-15 MED ORDER — DEXAMETHASONE SODIUM PHOSPHATE 10 MG/ML IJ SOLN
INTRAMUSCULAR | Status: DC | PRN
  Administered 2016-05-15: 10 mg via INTRAVENOUS

## 2016-05-15 MED ORDER — DIPHENHYDRAMINE HCL 12.5 MG/5ML PO ELIX: 12.5000 mg | ORAL_SOLUTION | Freq: Four times a day (QID) | ORAL | Status: DC | PRN

## 2016-05-15 MED ORDER — ONDANSETRON HCL 4 MG/2ML IJ SOLN
4.0000 mg | Freq: Four times a day (QID) | INTRAMUSCULAR | Status: DC | PRN
  Administered 2016-05-17 – 2016-05-20 (×4): 4 mg via INTRAVENOUS
  Filled 2016-05-15 (×3): qty 2

## 2016-05-15 MED ORDER — PROMETHAZINE HCL 25 MG/ML IJ SOLN: 6.2500 mg | INTRAMUSCULAR | Status: DC | PRN

## 2016-05-15 MED ORDER — ONDANSETRON HCL 4 MG/2ML IJ SOLN: 4.0000 mg | Freq: Four times a day (QID) | INTRAMUSCULAR | Status: DC | PRN

## 2016-05-15 MED ORDER — LORAZEPAM 2 MG/ML IJ SOLN
0.5000 mg | Freq: Three times a day (TID) | INTRAMUSCULAR | Status: DC | PRN
  Administered 2016-05-16 – 2016-05-19 (×7): 1 mg via INTRAVENOUS
  Filled 2016-05-15 (×8): qty 1

## 2016-05-15 MED ORDER — IOPAMIDOL (ISOVUE-300) INJECTION 61%
INTRAVENOUS | Status: AC
  Filled 2016-05-15: qty 50

## 2016-05-15 MED ORDER — SODIUM CHLORIDE 0.9% FLUSH: 9.0000 mL | INTRAVENOUS | Status: DC | PRN

## 2016-05-15 MED ORDER — MIDAZOLAM HCL 2 MG/2ML IJ SOLN
INTRAMUSCULAR | Status: AC
  Filled 2016-05-15: qty 2

## 2016-05-15 MED ORDER — PROPOFOL 10 MG/ML IV BOLUS
INTRAVENOUS | Status: AC
  Filled 2016-05-15: qty 20

## 2016-05-15 MED ORDER — ONDANSETRON HCL 4 MG/2ML IJ SOLN
4.0000 mg | INTRAMUSCULAR | Status: DC | PRN
  Administered 2016-05-15 – 2016-05-17 (×2): 4 mg via INTRAVENOUS
  Filled 2016-05-15 (×3): qty 2

## 2016-05-15 MED ORDER — LACTATED RINGERS IV SOLN: INTRAVENOUS | Status: DC

## 2016-05-15 MED ORDER — ALBUMIN HUMAN 5 % IV SOLN
INTRAVENOUS | Status: AC
  Filled 2016-05-15: qty 250

## 2016-05-15 MED ORDER — HYDROMORPHONE HCL 1 MG/ML IJ SOLN
1.0000 mg | INTRAMUSCULAR | Status: DC | PRN
  Administered 2016-05-15 – 2016-05-17 (×9): 2 mg via INTRAVENOUS
  Administered 2016-05-17: 1 mg via INTRAVENOUS
  Administered 2016-05-17: 2 mg via INTRAVENOUS
  Administered 2016-05-17: 1 mg via INTRAVENOUS
  Administered 2016-05-17: 2 mg via INTRAVENOUS
  Administered 2016-05-17 (×4): 1 mg via INTRAVENOUS
  Administered 2016-05-18 (×7): 2 mg via INTRAVENOUS
  Administered 2016-05-18: 1 mg via INTRAVENOUS
  Administered 2016-05-19 (×3): 2 mg via INTRAVENOUS
  Administered 2016-05-19: 1 mg via INTRAVENOUS
  Administered 2016-05-19: 2 mg via INTRAVENOUS
  Administered 2016-05-19 (×2): 1 mg via INTRAVENOUS
  Administered 2016-05-19 (×3): 2 mg via INTRAVENOUS
  Administered 2016-05-20 (×2): 1 mg via INTRAVENOUS
  Administered 2016-05-20 (×2): 2 mg via INTRAVENOUS
  Filled 2016-05-15 (×3): qty 2
  Filled 2016-05-15 (×2): qty 1
  Filled 2016-05-15 (×3): qty 2
  Filled 2016-05-15 (×2): qty 1
  Filled 2016-05-15 (×5): qty 2
  Filled 2016-05-15 (×2): qty 1
  Filled 2016-05-15 (×3): qty 2
  Filled 2016-05-15: qty 1
  Filled 2016-05-15 (×2): qty 2
  Filled 2016-05-15: qty 1
  Filled 2016-05-15 (×6): qty 2
  Filled 2016-05-15: qty 1
  Filled 2016-05-15 (×3): qty 2
  Filled 2016-05-15: qty 1
  Filled 2016-05-15 (×4): qty 2

## 2016-05-15 MED ORDER — SUGAMMADEX SODIUM 200 MG/2ML IV SOLN
INTRAVENOUS | Status: DC | PRN
  Administered 2016-05-15: 120 mg via INTRAVENOUS

## 2016-05-15 MED ORDER — PROPOFOL 10 MG/ML IV BOLUS
INTRAVENOUS | Status: DC | PRN
  Administered 2016-05-15: 100 mg via INTRAVENOUS

## 2016-05-15 MED ORDER — MENTHOL 3 MG MT LOZG: 1.0000 | LOZENGE | OROMUCOSAL | Status: DC | PRN

## 2016-05-15 MED ORDER — KCL IN DEXTROSE-NACL 20-5-0.9 MEQ/L-%-% IV SOLN
INTRAVENOUS | Status: DC
Start: 2016-05-15 — End: 2016-06-01
  Administered 2016-05-15 – 2016-05-25 (×15): via INTRAVENOUS
  Administered 2016-05-26: 50 mL/h via INTRAVENOUS
  Administered 2016-05-27 – 2016-05-29 (×4): via INTRAVENOUS
  Filled 2016-05-15 (×30): qty 1000

## 2016-05-15 MED ORDER — LACTATED RINGERS IV SOLN
INTRAVENOUS | Status: DC | PRN
  Administered 2016-05-15 (×2): via INTRAVENOUS

## 2016-05-15 MED ORDER — SODIUM CHLORIDE 0.9% FLUSH
10.0000 mL | INTRAVENOUS | Status: DC | PRN
  Administered 2016-05-16 – 2016-05-28 (×5): 10 mL
  Filled 2016-05-15 (×5): qty 40

## 2016-05-15 MED ORDER — ACETAMINOPHEN 500 MG PO TABS
1000.0000 mg | ORAL_TABLET | Freq: Three times a day (TID) | ORAL | Status: DC
  Administered 2016-05-21 – 2016-05-29 (×2): 1000 mg via ORAL
  Filled 2016-05-15 (×22): qty 2

## 2016-05-15 MED ORDER — MAGIC MOUTHWASH
15.0000 mL | Freq: Four times a day (QID) | ORAL | Status: DC | PRN
  Filled 2016-05-15: qty 15

## 2016-05-15 MED ORDER — MIDAZOLAM HCL 2 MG/2ML IJ SOLN
INTRAMUSCULAR | Status: DC | PRN
Start: 2016-05-15 — End: 2016-05-15
  Administered 2016-05-15 (×2): 1 mg via INTRAVENOUS

## 2016-05-15 MED ORDER — HEPARIN SODIUM (PORCINE) 5000 UNIT/ML IJ SOLN
5000.0000 [IU] | Freq: Three times a day (TID) | INTRAMUSCULAR | Status: DC
  Administered 2016-05-16 – 2016-06-05 (×55): 5000 [IU] via SUBCUTANEOUS
  Filled 2016-05-15 (×56): qty 1

## 2016-05-15 MED ORDER — STERILE WATER FOR IRRIGATION IR SOLN
Status: DC | PRN
  Administered 2016-05-15: 740 mL

## 2016-05-15 MED ORDER — NALOXONE HCL 0.4 MG/ML IJ SOLN: 0.4000 mg | INTRAMUSCULAR | Status: DC | PRN

## 2016-05-15 MED ORDER — SODIUM CHLORIDE 0.9 % IV SOLN
8.0000 mg | Freq: Four times a day (QID) | INTRAVENOUS | Status: DC | PRN
  Filled 2016-05-15: qty 4

## 2016-05-15 MED ORDER — BUPIVACAINE-EPINEPHRINE (PF) 0.25% -1:200000 IJ SOLN
INTRAMUSCULAR | Status: AC
  Filled 2016-05-15: qty 30

## 2016-05-15 MED ORDER — PIPERACILLIN-TAZOBACTAM 3.375 G IVPB
3.3750 g | Freq: Three times a day (TID) | INTRAVENOUS | Status: DC
  Administered 2016-05-15 – 2016-05-22 (×21): 3.375 g via INTRAVENOUS
  Filled 2016-05-15 (×23): qty 50

## 2016-05-15 MED ORDER — SUFENTANIL CITRATE 50 MCG/ML IV SOLN
INTRAVENOUS | Status: AC
  Filled 2016-05-15: qty 1

## 2016-05-15 MED ORDER — HYDROMORPHONE HCL 2 MG/ML IJ SOLN
INTRAMUSCULAR | Status: AC
  Filled 2016-05-15: qty 1

## 2016-05-15 MED ORDER — ONDANSETRON HCL 4 MG/2ML IJ SOLN
INTRAMUSCULAR | Status: AC
  Filled 2016-05-15: qty 2

## 2016-05-15 MED ORDER — LIP MEDEX EX OINT
1.0000 "application " | TOPICAL_OINTMENT | Freq: Two times a day (BID) | CUTANEOUS | Status: DC
  Administered 2016-05-16 – 2016-06-05 (×35): 1 via TOPICAL
  Filled 2016-05-15 (×9): qty 7

## 2016-05-15 MED ORDER — FAMOTIDINE IN NACL 20-0.9 MG/50ML-% IV SOLN
20.0000 mg | Freq: Two times a day (BID) | INTRAVENOUS | Status: DC
  Administered 2016-05-16 – 2016-05-21 (×12): 20 mg via INTRAVENOUS
  Filled 2016-05-15 (×12): qty 50

## 2016-05-15 MED ORDER — SUFENTANIL CITRATE 50 MCG/ML IV SOLN
INTRAVENOUS | Status: DC | PRN
  Administered 2016-05-15 (×4): 10 ug via INTRAVENOUS
  Administered 2016-05-15: 15 ug via INTRAVENOUS
  Administered 2016-05-15: 10 ug via INTRAVENOUS
  Administered 2016-05-15: 5 ug via INTRAVENOUS
  Administered 2016-05-15 (×3): 10 ug via INTRAVENOUS

## 2016-05-15 MED ORDER — PROCHLORPERAZINE EDISYLATE 5 MG/ML IJ SOLN
5.0000 mg | INTRAMUSCULAR | Status: DC | PRN
  Administered 2016-05-20: 10 mg via INTRAVENOUS
  Filled 2016-05-15: qty 2

## 2016-05-15 MED ORDER — METHOCARBAMOL 1000 MG/10ML IJ SOLN
1000.0000 mg | Freq: Four times a day (QID) | INTRAVENOUS | Status: DC | PRN
  Administered 2016-05-16 (×2): 1000 mg via INTRAVENOUS
  Filled 2016-05-15 (×5): qty 10

## 2016-05-15 MED ORDER — DEXAMETHASONE SODIUM PHOSPHATE 10 MG/ML IJ SOLN
INTRAMUSCULAR | Status: AC
  Filled 2016-05-15: qty 1

## 2016-05-15 MED ORDER — 0.9 % SODIUM CHLORIDE (POUR BTL) OPTIME
TOPICAL | Status: DC | PRN
  Administered 2016-05-15: 4000 mL

## 2016-05-15 MED ORDER — LIDOCAINE HCL (CARDIAC) 20 MG/ML IV SOLN
INTRAVENOUS | Status: DC | PRN
  Administered 2016-05-15: 100 mg via INTRAVENOUS

## 2016-05-15 MED ORDER — ALUM & MAG HYDROXIDE-SIMETH 200-200-20 MG/5ML PO SUSP
30.0000 mL | Freq: Four times a day (QID) | ORAL | Status: DC | PRN
  Administered 2016-05-17 – 2016-05-23 (×5): 30 mL via ORAL
  Filled 2016-05-15 (×6): qty 30

## 2016-05-15 MED ORDER — DIPHENHYDRAMINE HCL 50 MG/ML IJ SOLN: 12.5000 mg | Freq: Four times a day (QID) | INTRAMUSCULAR | Status: DC | PRN

## 2016-05-15 MED ORDER — ROCURONIUM BROMIDE 100 MG/10ML IV SOLN
INTRAVENOUS | Status: DC | PRN
  Administered 2016-05-15: 10 mg via INTRAVENOUS
  Administered 2016-05-15: 20 mg via INTRAVENOUS
  Administered 2016-05-15: 10 mg via INTRAVENOUS
  Administered 2016-05-15: 50 mg via INTRAVENOUS
  Administered 2016-05-15: 10 mg via INTRAVENOUS

## 2016-05-15 MED ORDER — KETAMINE HCL 10 MG/ML IJ SOLN
INTRAMUSCULAR | Status: DC | PRN
  Administered 2016-05-15: 20 mg via INTRAVENOUS
  Administered 2016-05-15 (×4): 10 mg via INTRAVENOUS

## 2016-05-15 MED ORDER — KETAMINE HCL 10 MG/ML IJ SOLN
INTRAMUSCULAR | Status: AC
  Filled 2016-05-15: qty 1

## 2016-05-15 MED ORDER — ONDANSETRON HCL 4 MG PO TABS: 4.0000 mg | ORAL_TABLET | Freq: Four times a day (QID) | ORAL | Status: DC | PRN

## 2016-05-15 MED ORDER — SUGAMMADEX SODIUM 200 MG/2ML IV SOLN
INTRAVENOUS | Status: AC
  Filled 2016-05-15: qty 2

## 2016-05-15 MED ORDER — ALBUMIN HUMAN 5 % IV SOLN
INTRAVENOUS | Status: DC | PRN
  Administered 2016-05-15: 11:00:00 via INTRAVENOUS

## 2016-05-15 MED ORDER — ONDANSETRON HCL 4 MG/2ML IJ SOLN
INTRAMUSCULAR | Status: DC | PRN
  Administered 2016-05-15: 4 mg via INTRAVENOUS

## 2016-05-15 MED ORDER — LACTATED RINGERS IV SOLN
INTRAVENOUS | Status: DC | PRN
  Administered 2016-05-15 (×3): via INTRAVENOUS

## 2016-05-15 MED ORDER — IOHEXOL 300 MG/ML  SOLN
INTRAMUSCULAR | Status: DC | PRN
  Administered 2016-05-15: 8 mL via URETHRAL

## 2016-05-15 MED ORDER — ROCURONIUM BROMIDE 100 MG/10ML IV SOLN
INTRAVENOUS | Status: AC
  Filled 2016-05-15: qty 1

## 2016-05-15 SURGICAL SUPPLY — 77 items
ADAPTER GOLDBERG URETERAL (ADAPTER) ×5 IMPLANT
APPLICATOR COTTON TIP 6IN STRL (MISCELLANEOUS) IMPLANT
BAG URO CATCHER STRL LF (MISCELLANEOUS) ×5 IMPLANT
BLADE EXTENDED COATED 6.5IN (ELECTRODE) IMPLANT
BLADE HEX COATED 2.75 (ELECTRODE) ×5 IMPLANT
BLADE SURG 15 STRL LF DISP TIS (BLADE) ×3 IMPLANT
BLADE SURG 15 STRL SS (BLADE) ×2
BNDG GAUZE ELAST 4 BULKY (GAUZE/BANDAGES/DRESSINGS) IMPLANT
CATH FOLEY LATEX FREE 16FR (CATHETERS) ×5 IMPLANT
CATH INTERMIT  6FR 70CM (CATHETERS) ×5 IMPLANT
CLOTH BEACON ORANGE TIMEOUT ST (SAFETY) IMPLANT
COVER MAYO STAND STRL (DRAPES) ×5 IMPLANT
COVER SURGICAL LIGHT HANDLE (MISCELLANEOUS) ×10 IMPLANT
DECANTER SPIKE VIAL GLASS SM (MISCELLANEOUS) IMPLANT
DRAIN CHANNEL 19F RND (DRAIN) IMPLANT
DRAIN CHANNEL RND F F (WOUND CARE) ×5 IMPLANT
DRAPE LAPAROSCOPIC ABDOMINAL (DRAPES) ×5 IMPLANT
DRAPE UTILITY XL STRL (DRAPES) ×5 IMPLANT
DRAPE WARM FLUID 44X44 (DRAPE) ×5 IMPLANT
DRSG PAD ABDOMINAL 8X10 ST (GAUZE/BANDAGES/DRESSINGS) IMPLANT
ELECT PENCIL ROCKER SW 15FT (MISCELLANEOUS) ×5 IMPLANT
ELECT REM PT RETURN 9FT ADLT (ELECTROSURGICAL) ×5
ELECTRODE REM PT RTRN 9FT ADLT (ELECTROSURGICAL) ×3 IMPLANT
EVACUATOR DRAINAGE 10X20 100CC (DRAIN) ×3 IMPLANT
EVACUATOR SILICONE 100CC (DRAIN) ×2
GAUZE SPONGE 4X4 12PLY STRL (GAUZE/BANDAGES/DRESSINGS) ×5 IMPLANT
GLOVE BIO SURGEON STRL SZ7.5 (GLOVE) ×5 IMPLANT
GLOVE BIOGEL M 8.0 STRL (GLOVE) ×5 IMPLANT
GLOVE ECLIPSE 8.0 STRL XLNG CF (GLOVE) ×10 IMPLANT
GLOVE INDICATOR 8.0 STRL GRN (GLOVE) ×10 IMPLANT
GOWN STRL REUS W/ TWL XL LVL3 (GOWN DISPOSABLE) ×3 IMPLANT
GOWN STRL REUS W/TWL LRG LVL3 (GOWN DISPOSABLE) ×10 IMPLANT
GOWN STRL REUS W/TWL XL LVL3 (GOWN DISPOSABLE) ×22 IMPLANT
GUIDEWIRE STR DUAL SENSOR (WIRE) ×5 IMPLANT
HANDLE SUCTION POOLE (INSTRUMENTS) ×3 IMPLANT
HEMOSTAT SNOW SURGICEL 2X4 (HEMOSTASIS) ×10 IMPLANT
KIT BASIN OR (CUSTOM PROCEDURE TRAY) ×5 IMPLANT
MANIFOLD NEPTUNE II (INSTRUMENTS) ×5 IMPLANT
NEEDLE HYPO 25X1 1.5 SAFETY (NEEDLE) IMPLANT
NS IRRIG 1000ML POUR BTL (IV SOLUTION) ×5 IMPLANT
PACK CYSTO (CUSTOM PROCEDURE TRAY) ×5 IMPLANT
PACK GENERAL/GYN (CUSTOM PROCEDURE TRAY) ×5 IMPLANT
PAD ABD 8X10 STRL (GAUZE/BANDAGES/DRESSINGS) ×5 IMPLANT
POUCH DRAINABLE 1PC 2 1/4 FLAT (OSTOMY) ×5 IMPLANT
SPONGE LAP 18X18 X RAY DECT (DISPOSABLE) ×10 IMPLANT
STAPLER PROXIMATE 75MM BLUE (STAPLE) ×5 IMPLANT
STAPLER VISISTAT 35W (STAPLE) ×5 IMPLANT
SUCTION POOLE HANDLE (INSTRUMENTS) ×5
SUT ETHILON 3 0 PS 1 (SUTURE) ×5 IMPLANT
SUT MNCRL AB 4-0 PS2 18 (SUTURE) IMPLANT
SUT PDS AB 1 CTX 36 (SUTURE) ×10 IMPLANT
SUT PROLENE 2 0 SH DA (SUTURE) ×5 IMPLANT
SUT SILK 2 0 (SUTURE) ×2
SUT SILK 2 0 SH CR/8 (SUTURE) ×5 IMPLANT
SUT SILK 2-0 18XBRD TIE 12 (SUTURE) ×3 IMPLANT
SUT SILK 3 0 (SUTURE) ×2
SUT SILK 3 0 SH CR/8 (SUTURE) ×5 IMPLANT
SUT SILK 3-0 18XBRD TIE 12 (SUTURE) ×3 IMPLANT
SUT VIC AB 2-0 SH 18 (SUTURE) ×5 IMPLANT
SUT VIC AB 3-0 SH 18 (SUTURE) ×5 IMPLANT
SUT VIC AB 3-0 SH 27 (SUTURE)
SUT VIC AB 3-0 SH 27XBRD (SUTURE) IMPLANT
SUT VIC AB 3-0 SH 8-18 (SUTURE) ×5 IMPLANT
SUT VICRYL 2 0 18  UND BR (SUTURE)
SUT VICRYL 2 0 18 UND BR (SUTURE) IMPLANT
SWAB COLLECTION DEVICE MRSA (MISCELLANEOUS) IMPLANT
SWAB CULTURE ESWAB REG 1ML (MISCELLANEOUS) IMPLANT
SYR BULB 3OZ (MISCELLANEOUS) ×5 IMPLANT
SYR CONTROL 10ML LL (SYRINGE) IMPLANT
TAPE CLOTH SURG 4X10 WHT LF (GAUZE/BANDAGES/DRESSINGS) ×5 IMPLANT
TOWEL OR 17X26 10 PK STRL BLUE (TOWEL DISPOSABLE) ×10 IMPLANT
TOWEL OR NON WOVEN STRL DISP B (DISPOSABLE) ×5 IMPLANT
TRAY FOLEY W/METER SILVER 14FR (SET/KITS/TRAYS/PACK) IMPLANT
TRAY FOLEY W/METER SILVER 16FR (SET/KITS/TRAYS/PACK) IMPLANT
TUBING CONNECTING 10 (TUBING) ×4 IMPLANT
TUBING CONNECTING 10' (TUBING) ×1
YANKAUER SUCT BULB TIP NO VENT (SUCTIONS) ×5 IMPLANT

## 2016-05-15 NOTE — Anesthesia Procedure Notes (Signed)
Procedure Name: Intubation Date/Time: 05/15/2016 9:39 AM Performed by: Leroy Libman L Patient Re-evaluated:Patient Re-evaluated prior to inductionOxygen Delivery Method: Circle system utilized Preoxygenation: Pre-oxygenation with 100% oxygen Intubation Type: IV induction Ventilation: Mask ventilation without difficulty and Oral airway inserted - appropriate to patient size Laryngoscope Size: Hyacinth Meeker and 2 Grade View: Grade I Tube type: Oral Tube size: 7.5 mm Number of attempts: 1 Airway Equipment and Method: Stylet Placement Confirmation: ETT inserted through vocal cords under direct vision,  positive ETCO2 and breath sounds checked- equal and bilateral Secured at: 21 cm Tube secured with: Tape Dental Injury: Teeth and Oropharynx as per pre-operative assessment

## 2016-05-15 NOTE — Op Note (Signed)
Operative Note  Sheila Willis female 30 y.o. 05/15/2016  PREOPERATIVE DX:  Complex, recurrent left tubo-ovarian abscess. Colocutaneous fistula.  POSTOPERATIVE DX:  Same  PROCEDURE:   Exploratory laparotomy, drainage of abscess, partial descending colectomy,colostomy, incidental appendectomy         Surgeon: Adolph Pollack   Assistants: none  Intraoperative consultation: Dr. Noland Fordyce  Anesthesia: General endotracheal anesthesia  Indications:   This is a 30 year old female who is in the hospital in July with a left tubo-ovarian abscess. She had a percutaneous drain placed in it from the left side had some residual fluid and had a percutaneous drain placed from the right. A couple days after the right-sided drain was placed it began having feculent output. She iimproved and was able to leave the hospital. The left-sided drain was removed but the right-sided drain remained. She presented to the emergency department earlier this week having pain and fever and was noted to have a recurrent complex tubo-ovarian abscess on the left side. Interventional radiology did not feel this could be drained. There is concern that the tip of the drainage catheter is actually in the colon. She now brought to the operating room for the above procedure.    Procedure Detail:  She was brought to the operating room placed supine on the operating table and a general anesthetic was administered. Cystoscopy and a left ureteral stent replaced by Dr. Vernie Ammons. The percutaneous drain was cut at skin level.  The abdomen was widely sterilely prepped and draped.  A timeout was performed.    A midline incision was made beginning at the umbilicus and extending down to the pubis.This was eventually extended superiorly just above the umbilicus. The fascia and peritoneum were divided entering the abdominal cavity. Omentum was densely adherent to the fundus of the uterus. Self-retaining retractor was placed.  Normal-appearing left colon was identified and mobilized by dividing its lateral attachment superiorly. I then began the mobilization inferiorly until I came to a very firm inflammatory process once I opened the abdomen, the percutaneous drain came out and had feculent staining on its tip. Subsequently I used sharp dissection to mobilize the omentum off of the uterus. I identified a segment of colon that was walling off a left-sided pelvic abscess. There was no feculent drainage around this area. There was a punched out small hole in the colon. This was closed with 2-0 silk suture.  I then bluntly created a space in the pelvis and drained an abscess. I divided the round ligament close to the uterus to be ab;e tp the mobilize the uterus medially. Chronic inflamed tissue was noted but there was no identifiable ovary or  Fallopian tube on the left. The inflammatory process extended and involved the proximal posterior uterus as well as the right adnexal area.This was all dissected free and drained. Right ovary was normal and intact. Part of the right fallopian tube appeared to be involved in the process as well.  I then began mobilizing what was the descending colon was densely adhered to the lateral sidewall. I identified the ureter by being up to palpate the stent and then visualized the ureter. Inflammatory process involved the ureter and the internal iliac vein as it went into the pelvis. I found another abscess just lateral to the anterior area of the uterus and drained this down into the pelvic area. I mobilized the descending colon down into the pelvis the left side worse densely adherent. This is brought up into the room. The sigmoid  colon appeared somewhat inflamed as is the proximal rectum but most of the prosthesis appeared to involve the distal descending colon.  Using the linear cutting stapler, I divided the colon  Proximally and distally to the inflamed segment. The distal segment was marked with a  Prolene suture. The mesentery was divided close to the colon with the LigaSure. The specimen was handed off the field as part of left colon.  I called Dr. Ernestina Penna and requested intraoperative consultation.  A definitive left ovary and tube could not be identified to remove safely.  Given that the abscesses had been adequately drained, we decided not to proceed any further with this.  The appendix was long and extending down into the pelvis. I decided do an incidental appendectomy. The mesoappendix was divided with LigaSure. The appendix was amputated off the cecum, with a small cuff of cecum, using the linear cutting stapler.  Then copiously irrigated out the area. There is some oozing from part of the uterus and this was controlled with Surgicel and direct pressure. A stab wound was then made in the right abdomen. A size 19 Blake drain was then put into the cavity and placed into the pelvis. It was anchored to the skin with 3-0 nylon suture.  At a previous site there was marked and left abdominal wall, a circular incision was made through the skin and subcutaneous tissue. A cruciate incision was then made in the anterior and posterior fascia. The stump of descending colon was then brought up through this and anchored anteriorly to the fascia with interrupted 2-0 Vicryl sutures.  The fascia the midline incision was then closed with a running #1 PDS suture. The subcutaneous tissue was left open. Needle sponge and his counts reported be correct at this time. The descending colostomy was then matured with interrupted 3-0 Vicryl sutures and a appliance placed on it. Drain was hooked to bulb suction.Midline wound subcutaneous tissue was packed with saline moistened gauze followed by dry dressing.  She tolerated the procedure without apparent complications. She is taken recovery room in satisfactory condition.   Estimated Blood Loss:  750 ml         Drains: #19 Blake drain and pelvis where abscess was.                 Specimens: Part of descending colon. Appendix.        Complications:  * No complications entered in OR log *         Disposition: PACU - hemodynamically stable.         Condition: stable

## 2016-05-15 NOTE — Op Note (Signed)
PATIENT:  Sheila Willis  PRE-OPERATIVE DIAGNOSIS: 1. Left pelvic abscess. 2. Mild left ureteral dilatation.  POST-OPERATIVE DIAGNOSIS: Same  PROCEDURE: 1. Cystoscopy with left retrograde pyelogram including interpretation.   2. Left internal/external open-ended ureteral stent placement.  SURGEON:  Garnett Farm  INDICATION: Sheila Willis is a 30 year old female who has developed a large pelvic abscess that is impinging on the left ureter. She is about to undergo abdominal exploration with drainage of the abscess and I was asked by Dr. Abbey Chatters to place a stent in the left ureter in order to aid in identification of the left ureter intraoperatively. I have reviewed her CT scan and do note mild dilatation of the ureter proximal to where it enters into the area of the abscess but I note her creatinine has remained normal at 0.78 and she is not experiencing any flank pain. I have discussed the procedure with her in detail including how the stent would be placed, the risks and complications, the alternatives and the anticipated removal of the stent at the end of the operation. I told her that I did not feel placement of a double-J stent was necessary as this is an inflammatory condition that will undoubtedly resolve after drainage of her abscess has been undertaken and with a normal creatinine and no flank pain we decided not to place a double-J stent at this time.   ANESTHESIA:  General  EBL:  Minimal  DRAINS:6 [place], open-ended, internal/external ureteral stent in the left ureter.  LOCAL MEDICATIONS USED:  None  SPECIMEN:  None  Description of procedure: After informed consent the patient was taken to the operating room and placed on the table in a supine position. General anesthesia was then administered. Once fully anesthetized the patient was moved to the dorsal lithotomy position and the genitalia were sterilely prepped and draped in standard fashion. An official timeout was then  performed. Latex precautions were used.  The 23 French rigid cystoscope was then passed under direct vision through the urethra which is noted be normal and into the bladder. The bladder was fully and systematically inspected and noted to be free of any tumors, stones or inflammatory lesions. Both ureteral orifices were noted to be of normal configuration and position with clear reflux.  A 6 French open-ended ureteral catheter was then passed through the cystoscope and into the left ureteral orifice. I used full strength Omnipaque contrast and injected this through the open ended catheter and up the left ureter under direct fluoroscopy. It revealed a normal appearing distal ureter with some slight irregularity at the lower border of the sacrum with some slight dilatation proximal to that. The remainder of the proximal ureter and intrarenal collecting system was noted be normal.  I passed the 0.038 inch floppy-tipped sensor guidewire through the open ended catheter and upper left ureter into the area the renal pelvis. The open-ended catheter was then passed over the guidewire into the area the renal pelvis and the guidewire was removed. The catheter was held in place and the cystoscope removed and a Foley catheter was inserted. The stent was connected to a [place] adapter and the catheter was connected to this as well and then to closed system drainage. There was a slight amount of hematuria coming from the stent at that cleared. The patient tolerated the procedure well no intraoperative complications.    PLAN OF CARE: Discharge to home after PACU  PATIENT DISPOSITION:  PACU - hemodynamically stable.

## 2016-05-15 NOTE — Transfer of Care (Signed)
Immediate Anesthesia Transfer of Care Note  Patient: Madline A Stanczak  Procedure(s) Performed: Procedure(s): EXPLORATORY LAPAROTOMY (N/A) DRAINAGE ABSCESS OF INTRA ABDOMINAL ABSCESS (N/A) partial  COLECTOMY AND  COLOSTOMY (N/A) CYSTOSCOPY WITH LEFT  STENT REPLACEMENT (Left) incidental APPENDECTOMY  Patient Location: PACU  Anesthesia Type:General  Level of Consciousness: awake  Airway & Oxygen Therapy: Patient Spontanous Breathing and Patient connected to face mask oxygen  Post-op Assessment: Report given to RN and Post -op Vital signs reviewed and stable  Post vital signs: Reviewed and stable  Last Vitals:  Vitals:   05/14/16 2222 05/15/16 0555  BP: 121/88 104/60  Pulse: 89 79  Resp: 16 18  Temp: 37.3 C 36.5 C    Last Pain:  Vitals:   05/15/16 0830  TempSrc:   PainSc: 4       Patients Stated Pain Goal: 4 (05/15/16 0750)  Complications: No apparent anesthesia complications

## 2016-05-15 NOTE — Anesthesia Postprocedure Evaluation (Signed)
Anesthesia Post Note  Patient: Sheila Willis  Procedure(s) Performed: Procedure(s) (LRB): EXPLORATORY LAPAROTOMY (N/A) DRAINAGE ABSCESS OF INTRA ABDOMINAL ABSCESS (N/A) partial  COLECTOMY AND  COLOSTOMY (N/A) CYSTOSCOPY WITH LEFT  STENT REPLACEMENT (Left) incidental APPENDECTOMY  Patient location during evaluation: PACU Anesthesia Type: General Level of consciousness: awake and alert Pain management: pain level controlled Vital Signs Assessment: post-procedure vital signs reviewed and stable Respiratory status: spontaneous breathing, nonlabored ventilation, respiratory function stable and patient connected to nasal cannula oxygen Cardiovascular status: blood pressure returned to baseline and stable Postop Assessment: no signs of nausea or vomiting Anesthetic complications: no    Last Vitals:  Vitals:   05/15/16 1500 05/15/16 1528  BP:  127/89  Pulse:  74  Resp:  (!) 23  Temp: 37 C 37.3 C    Last Pain:  Vitals:   05/15/16 1500  TempSrc:   PainSc: Asleep                 Kennieth Rad

## 2016-05-15 NOTE — Anesthesia Preprocedure Evaluation (Addendum)
Anesthesia Evaluation  Patient identified by MRN, date of birth, ID band Patient awake    Reviewed: Allergy & Precautions, NPO status , Patient's Chart, lab work & pertinent test results  Airway Mallampati: II  TM Distance: >3 FB Neck ROM: Full    Dental  (+) Dental Advisory Given, Teeth Intact   Pulmonary former smoker,    breath sounds clear to auscultation       Cardiovascular negative cardio ROS   Rhythm:Regular Rate:Normal     Neuro/Psych negative neurological ROS     GI/Hepatic Neg liver ROS, Intraabdominal abscess   Endo/Other  diabetes  Renal/GU negative Renal ROS     Musculoskeletal   Abdominal   Peds  Hematology  (+) anemia ,   Anesthesia Other Findings   Reproductive/Obstetrics                            Lab Results  Component Value Date   WBC 12.2 (H) 05/14/2016   HGB 10.1 (L) 05/14/2016   HCT 30.9 (L) 05/14/2016   MCV 92.8 05/14/2016   PLT 279 05/14/2016   Lab Results  Component Value Date   CREATININE 0.78 05/14/2016   BUN <5 (L) 05/14/2016   NA 138 05/14/2016   K 3.8 05/14/2016   CL 107 05/14/2016   CO2 26 05/14/2016    Anesthesia Physical Anesthesia Plan  ASA: III  Anesthesia Plan: General   Post-op Pain Management:    Induction: Intravenous  Airway Management Planned: Oral ETT  Additional Equipment:   Intra-op Plan:   Post-operative Plan: Extubation in OR and Possible Post-op intubation/ventilation  Informed Consent: I have reviewed the patients History and Physical, chart, labs and discussed the procedure including the risks, benefits and alternatives for the proposed anesthesia with the patient or authorized representative who has indicated his/her understanding and acceptance.   Dental advisory given  Plan Discussed with: CRNA  Anesthesia Plan Comments:         Anesthesia Quick Evaluation

## 2016-05-15 NOTE — Progress Notes (Signed)
[facility]    Date of Admission:  05/13/2016   Total days of antibiotics 3        Day 3 piptazo          ID: Sheila Willis is a 30 y.o. female with complex, recurrent left tuob-ovarian abscess, and colocutaneous fistula Active Problems:   Intra-abdominal abscess (HCC)   Pelvic abscess in female    Subjective: She remains afebrile since starting piptazo. She underwent surgery without difficulty today. Initially had L-ureteral stent placement then had ex lap with drainage of abscess and partial descending colectomy, appendectomy and colostomy placement.   Medications:  . [MAR Hold] famotidine (PEPCID) IV  20 mg Intravenous Q12H  . [MAR Hold] feeding supplement  1 Container Oral TID BM  . [MAR Hold] heparin subcutaneous  5,000 Units Subcutaneous Q8H  . HYDROmorphone      . HYDROmorphone      . morphine   Intravenous Q4H  . [MAR Hold] piperacillin-tazobactam (ZOSYN)  IV  3.375 g Intravenous Q8H    Objective: Vital signs in last 24 hours: Temp:  [97.7 F (36.5 C)-99.1 F (37.3 C)] 98.6 F (37 C) (08/03 1500) Pulse Rate:  [79-89] 79 (08/03 0555) Resp:  [16-20] 20 (08/03 1436) BP: (104-121)/(60-88) 104/60 (08/03 0555) SpO2:  [99 %-100 %] 100 % (08/03 1436) Physical Exam  Constitutional:  oriented to person, place, and time. appears well-developed and well-nourished. In mild distress. groaning HENT: [place]/AT, PERRLA, no scleral icterus Mouth/Throat: Oropharynx is clear and moist. No oropharyngeal exudate.  Cardiovascular: tachycardic, regular rhythm and normal heart sounds. Exam reveals no gallop and no friction rub.  No murmur heard.  Pulmonary/Chest: Effort normal and breath sounds normal. No respiratory distress.  has no wheezes.  Neck = supple, no nuchal rigidity Abdominal: Soft. Bowel sounds are decreased.  There is RLQ drain. And ostomy in Left Quadrant. Midline has bandage covering laparotomy Lymphadenopathy: no cervical adenopathy. No  axillary adenopathy Neurological: alert and oriented to person, place, and time.  Skin: Skin is warm and dry. No rash noted. No erythema.  Psychiatric: sleeping, moaning occasionally   Lab Results  Recent Labs  05/13/16 0953 05/14/16 0554  WBC 19.0* 12.2*  HGB 12.7 10.1*  HCT 38.9 30.9*  NA 137 138  K 3.8 3.8  CL 103 107  CO2 23 26  BUN 7 <5*  CREATININE 0.88 0.78   Liver Panel  Recent Labs  05/13/16 0953  PROT 8.8*  ALBUMIN 3.5  AST 17  ALT 9*  ALKPHOS 108  BILITOT 0.8    Microbiology: 8/1 blood cx ngtd Studies/Results: Dg Chest Port 1 View  Result Date: 05/14/2016 CLINICAL DATA:  30 year old female with PICC placement. EXAM: PORTABLE CHEST 1 VIEW COMPARISON:  Chest CT dated 05/13/2016 FINDINGS: Right-sided PICC with tip over central SVC. No pneumothorax. The lungs are clear. No pleural effusion. The cardiac silhouette is within normal limits. No acute osseous pathology. IMPRESSION: Right-sided PICC with tip over central SVC.  No pneumothorax. Electronically Signed   By: Elgie Collard M.D.   On: 05/14/2016 05:43   Dg C-arm 1-60 Min-no Report  Result Date: 05/15/2016 CLINICAL DATA: surgery- stent placement C-ARM 1-60 MINUTES Fluoroscopy was utilized by the requesting physician.  No radiographic interpretation.     Assessment/Plan: Intra-abdominal abscess from colocutaneous fistula and recurrent left TOA = continue on piptazo. Recommend to finish out 5 day course starting today since her source has been controlled.  Abdominal pain, post surgery =  continue with opiate PCA.   Drue Second [facility] for Infectious Diseases Cell: [phone] Pager: [phone]  05/15/2016, 3:15 PM

## 2016-05-15 NOTE — Consult Note (Signed)
GYN Intraoperative Consult.  Pt well known to me. Have been following this pt's course which started with left sided TOA, treated initially with Gent/Clinda/Flagy and removal of IUD. 3 days into treatment pt had resolution of leukocytosis but continued LLQ pain and CT obtained which revealed large abscess with question of free air.  General surgery and VIR became involved in her care at that time, a drain was placed and after concern for colocutaneous fistula pt was transferred to the  general surgery service. Initially she did well with conservative, non-surgical management and IV antibiotics but recently represented with new abscess formation. She is undergoing exploratory laparotomy and removal of pelvic abscesses with colectomy by Dr. Abbey Chatters. I have previously had many discussions with the patient about the possibility of surgery and the plan had been for LSO at the time of her initial surgery.   Upon my arrival to the OR, pt was intubated and laparotomy with significant lysis of adhesions and removal of abscess cavities and partial descending colectomy had already been completed. The left ureter was stented. Deep pelvic dissection was complete. The uterus was slightly enlarged and boggy but no evidence of fibroids. The bladder was retracted anteriorly. The right round ligament was normal. The right ovary was normal. The mid portion of the right fallopian tube and mesosalpynx were removed during Dr. Arne Cleveland dissection of the abscesses. The proximal and distal tubal stumps were hemostatic. The remaining right mesosalpynx was edematous. The uterine fundus has undergone dissection but was intact and hemostatic. The uterus was torqued to the right with the right cornua facing posteriorly into the pelvis and the left cornua facing anteriorly. The left round ligament was edematous and transected in the midportion prior to allow mobilization. It was hemostatic. Significant dissection had been done in the  left adnexa. No identifiable left adnexal structures were seen. Ligasure transection of the abscess was done at the left cornua. No left ovary, fallopian tube, utero-ovarian ligament or left mesosalpynx were present. I then followed the course of the left ureter (stented) to the pelvic brim and identified the left IP ligament. Following this IP back into the pelvis it had been sealed and transected as part of the initial dissection. No ovary was seen. I concluded that during the abscess dissection Dr. Abbey Chatters had completed a partial right salpingectomy and a complete left salpingo-oophorectomy as per the pre-operative plan. I did not complete an additional surgical dissection.   From a gynecologic perscpective post-operatively pt will be expected to have routine menses as her uterus is intact and have normal hormone production from the right ovary. I will still recommend hormonal contraception given the risks of fistulas tracts forming between the proximal and distal segment of the R fallopian tube and the patients need for menstrual suppression due to her PCOS.    Sheila Iglesia A. 05/15/2016 7:14 PM    20 minutes spent participating in the care of this patient

## 2016-05-16 DIAGNOSIS — N179 Acute kidney failure, unspecified: Secondary | ICD-10-CM

## 2016-05-16 DIAGNOSIS — Z978 Presence of other specified devices: Secondary | ICD-10-CM

## 2016-05-16 DIAGNOSIS — D62 Acute posthemorrhagic anemia: Secondary | ICD-10-CM

## 2016-05-16 LAB — BASIC METABOLIC PANEL
ANION GAP: 7 (ref 5–15)
BUN: 6 mg/dL (ref 6–20)
CHLORIDE: 105 mmol/L (ref 101–111)
CO2: 26 mmol/L (ref 22–32)
Calcium: 8.6 mg/dL — ABNORMAL LOW (ref 8.9–10.3)
Creatinine, Ser: 1.21 mg/dL — ABNORMAL HIGH (ref 0.44–1.00)
GFR calc Af Amer: >60 mL/min (ref >60)
GFR calc non Af Amer: 59 mL/min — ABNORMAL LOW (ref >60)
Glucose, Bld: 131 mg/dL — ABNORMAL HIGH (ref 65–99)
POTASSIUM: 4.2 mmol/L (ref 3.5–5.1)
SODIUM: 138 mmol/L (ref 135–145)

## 2016-05-16 LAB — CBC
HEMATOCRIT: 27.1 % — AB (ref 36.0–46.0)
Hemoglobin: 9.1 g/dL — ABNORMAL LOW (ref 12.0–15.0)
MCH: 30.5 pg (ref 26.0–34.0)
MCHC: 33.6 g/dL (ref 30.0–36.0)
MCV: 90.9 fL (ref 78.0–100.0)
Platelets: 318 10*3/uL (ref 150–400)
RBC: 2.98 MIL/uL — ABNORMAL LOW (ref 3.87–5.11)
RDW: 13.1 % (ref 11.5–15.5)
WBC: 16.3 10*3/uL — AB (ref 4.0–10.5)

## 2016-05-16 NOTE — Consult Note (Signed)
WOC Nurse ostomy consult note Stoma type/location:  LLQ colostomy.  Initial pouch change. Stomal assessment/size: 1 and 3/8 inches round, red, moist, slightly budded, os at center.  Minor crease at 3 and 9 o'clock Peristomal assessment: intact Treatment options for stomal/peristomal skin: skin barrier ring Output Scant serosanguinous Ostomy pouching: 2pc., 2 and 1/4 inch pouching system with skin barrier ring. Supplies at bedside. Education provided: Limited today, patient is closing eyes.  Briefly looks at stoma, then looks away.  Mother is at bedside, child sleeping in room.  Mother able to give return demonstration of Lock and Roll closure and pouch opening. Demonstrated use of toilet paper "wick" and explained rationale for use. Enrolled patient in [employer] DC program: No WOC nursing team will follow, and will remain available to this patient, the nursing, surgical and medical teams. Next pouch change due on Monday, August 7. One of my partners will see in my absence. Thanks, Ladona Mow, MSN, RN, GNP, [place]  Pager# [phone]

## 2016-05-16 NOTE — Progress Notes (Signed)
Patient educated on the use and benefits of incentive spirometer. Patient refused stating, "I do not trust it."  Patient educated on the importance of coughing and deep breathing if she continues to refuse incentive spirometer.

## 2016-05-16 NOTE — Progress Notes (Signed)
Patient requested air mattress for comfort. MD, Rosenbower paged received call back for air mattress order placed. Bed ordered and placed in patient's room.

## 2016-05-16 NOTE — Progress Notes (Signed)
1 Day Post-Op  Subjective: Very sore.  Sad.  Mother in room  Objective: Vital signs in last 24 hours: Temp:  [98 F (36.7 C)-99.2 F (37.3 C)] 98.8 F (37.1 C) (08/04 1107) Pulse Rate:  [68-91] 88 (08/04 1107) Resp:  [12-23] 15 (08/04 1147) BP: (105-129)/(74-89) 111/89 (08/04 1107) SpO2:  [97 %-100 %] 98 % (08/04 1147) Last BM Date: 05/15/16  Intake/Output from previous day: 08/03 0701 - 08/04 0700 In: 5991.3 [I.V.:5481.3; IV Piggyback:500] Out: 3080 [Urine:2175; Drains:155; Blood:750] Intake/Output this shift: Total I/O In: -  Out: 470 [Urine:450; Drains:20]  PE: General- In NAD Abdomen-soft, dressing dry, stoma pink and viable with no gas or stool in bag.  Lab Results:   Recent Labs  05/14/16 0554 05/16/16 0505  WBC 12.2* 16.3*  HGB 10.1* 9.1*  HCT 30.9* 27.1*  PLT 279 318   BMET  Recent Labs  05/14/16 0554 05/16/16 0505  NA 138 138  K 3.8 4.2  CL 107 105  CO2 26 26  GLUCOSE 101* 131*  BUN <5* 6  CREATININE 0.78 1.21*  CALCIUM 8.8* 8.6*   PT/INR  Recent Labs  05/15/16 0442  LABPROT 15.1  INR 1.18   Comprehensive Metabolic Panel:    Component Value Date/Time   NA 138 05/16/2016 0505   NA 138 05/14/2016 0554   K 4.2 05/16/2016 0505   K 3.8 05/14/2016 0554   CL 105 05/16/2016 0505   CL 107 05/14/2016 0554   CO2 26 05/16/2016 0505   CO2 26 05/14/2016 0554   BUN 6 05/16/2016 0505   BUN <5 (L) 05/14/2016 0554   CREATININE 1.21 (H) 05/16/2016 0505   CREATININE 0.78 05/14/2016 0554   GLUCOSE 131 (H) 05/16/2016 0505   GLUCOSE 101 (H) 05/14/2016 0554   CALCIUM 8.6 (L) 05/16/2016 0505   CALCIUM 8.8 (L) 05/14/2016 0554   AST 17 05/13/2016 0953   AST 19 04/24/2016 0557   ALT 9 (L) 05/13/2016 0953   ALT 10 (L) 04/24/2016 0557   ALKPHOS 108 05/13/2016 0953   ALKPHOS 71 04/24/2016 0557   BILITOT 0.8 05/13/2016 0953   BILITOT 0.5 04/24/2016 0557   PROT 8.8 (H) 05/13/2016 0953   PROT 5.1 (L) 04/24/2016 0557   ALBUMIN 3.5 05/13/2016 0953   ALBUMIN 2.1 (L) 04/24/2016 0557     Studies/Results: Dg C-arm 1-60 Min-no Report  Result Date: 05/15/2016 CLINICAL DATA: surgery- stent placement C-ARM 1-60 MINUTES Fluoroscopy was utilized by the requesting physician.  No radiographic interpretation.    Anti-infectives: Anti-infectives    Start     Dose/Rate Route Frequency Ordered Stop   05/15/16 1600  piperacillin-tazobactam (ZOSYN) IVPB 3.375 g     3.375 g 12.5 mL/hr over 240 Minutes Intravenous Every 8 hours 05/15/16 1547     05/15/16 0830  cefoTEtan (CEFOTAN) 2 g in dextrose 5 % 50 mL IVPB     2 g 100 mL/hr over 30 Minutes Intravenous On call to O.R. 05/14/16 0920 05/15/16 0945   05/14/16 0930  cefoTEtan (CEFOTAN) 2 g in dextrose 5 % 50 mL IVPB  Status:  Discontinued     2 g 100 mL/hr over 30 Minutes Intravenous On call to O.R. 05/14/16 1610 05/14/16 0921   05/13/16 2200  vancomycin (VANCOCIN) IVPB 750 mg/150 ml premix  Status:  Discontinued     750 mg 150 mL/hr over 60 Minutes Intravenous Every 12 hours 05/13/16 1024 05/13/16 1529   05/13/16 1600  piperacillin-tazobactam (ZOSYN) IVPB 3.375 g  Status:  Discontinued  3.375 g 12.5 mL/hr over 240 Minutes Intravenous Every 8 hours 05/13/16 1024 05/15/16 1547   05/13/16 1015  vancomycin (VANCOCIN) IVPB 1000 mg/200 mL premix     1,000 mg 200 mL/hr over 60 Minutes Intravenous  Once 05/13/16 1006 05/13/16 1233   05/13/16 1015  piperacillin-tazobactam (ZOSYN) IVPB 3.375 g     3.375 g 100 mL/hr over 30 Minutes Intravenous  Once 05/13/16 1006 05/13/16 1104      Assessment S/p expl lap, drainage of pelvic abscesses, partial colectomy, 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  Continue abxs. Recheck lab tomorrow.   Jarika Robben J 05/16/2016

## 2016-05-16 NOTE — Progress Notes (Signed)
    [facility]    Date of Admission:  05/13/2016   Total days of antibiotics 3        Day 3 piptazo          ID: Sheila Willis is a 30 y.o. female with complex, recurrent left tuob-ovarian abscess, and colocutaneous fistula POD#1 S/p ex lap with drainage of abscess and partial descending colectomy, appendectomy, L SOO, and partial R salpingectomy and colostomy placement and ureter stent placement Active Problems:   Intra-abdominal abscess (HCC)   Pelvic abscess in female    Subjective: She remains afebrile still somewhat sore from abdominal/pelvic surgery. Sleepy most of the day. Started on clear liquids today  Objective: WBC up 16K, cr elevated at 1.2  Medications:  . acetaminophen  1,000 mg Oral TID  . famotidine (PEPCID) IV  20 mg Intravenous Q12H  . heparin subcutaneous  5,000 Units Subcutaneous Q8H  . HYDROmorphone   Intravenous Q4H  . lip balm  1 application Topical BID  . piperacillin-tazobactam (ZOSYN)  IV  3.375 g Intravenous Q8H    Objective: Vital signs in last 24 hours: Temp:  [98 F (36.7 C)-99.2 F (37.3 C)] 98.8 F (37.1 C) (08/04 1107) Pulse Rate:  [68-91] 88 (08/04 1107) Resp:  [12-23] 15 (08/04 1147) BP: (105-129)/(74-89) 111/89 (08/04 1107) SpO2:  [97 %-100 %] 98 % (08/04 1147) Physical Exam  Constitutional:  oriented to person, place, and time. appears well-developed and well-nourished. In mild distress. groaning HENT: [place]/AT, PERRLA, no scleral icterus Mouth/Throat: Oropharynx is clear and moist. No oropharyngeal exudate.  Cardiovascular: tachycardic, regular rhythm and normal heart sounds. Exam reveals no gallop and no friction rub.  No murmur heard.  Pulmonary/Chest: Effort normal and breath sounds normal. No respiratory distress.  has no wheezes.  Neck = supple, no nuchal rigidity Abdominal: Soft. Bowel sounds are present. There is RLQ drain. And ostomy in Left Quadrant. Midline has bandage covering  laparotomy Lymphadenopathy: no cervical adenopathy. No axillary adenopathy Neurological: alert and oriented to person, place, and time.  Skin: Skin is warm and dry. No rash noted. No erythema.  Psychiatric: sleeping, moaning occasionally   Lab Results  Recent Labs  05/14/16 0554 05/16/16 0505  WBC 12.2* 16.3*  HGB 10.1* 9.1*  HCT 30.9* 27.1*  NA 138 138  K 3.8 4.2  CL 107 105  CO2 26 26  BUN <5* 6  CREATININE 0.78 1.21*   Microbiology: 8/1 blood cx ngtd Studies/Results: Dg C-arm 1-60 Min-no Report  Result Date: 05/15/2016 CLINICAL DATA: surgery- stent placement C-ARM 1-60 MINUTES Fluoroscopy was utilized by the requesting physician.  No radiographic interpretation.     Assessment/Plan: Intra-abdominal abscess from colocutaneous fistula and recurrent left TOA = continue on piptazo. Recommend to finish out 5 day course starting today since her source has been controlled to finish through monday  Abdominal pain, post surgery = continue with opiate PCA per primary team  AKI = likely related to stent placement and possible volume loss from surgery, recommend to continue to follow bmp  Anemia = hgb down to 9 previously baseline likely around 11. Continue to monitor  Dr Orvan Falconer available for questions over the weekend. I will see back on Rhae Hammock, [facility] for Infectious Diseases Cell: [phone] Pager: [phone]  05/16/2016, 2:47 PM

## 2016-05-17 LAB — BASIC METABOLIC PANEL
ANION GAP: 5 (ref 5–15)
BUN: <5 mg/dL — ABNORMAL LOW (ref 6–20)
CHLORIDE: 107 mmol/L (ref 101–111)
CO2: 26 mmol/L (ref 22–32)
Calcium: 8.3 mg/dL — ABNORMAL LOW (ref 8.9–10.3)
Creatinine, Ser: 1.06 mg/dL — ABNORMAL HIGH (ref 0.44–1.00)
GFR calc non Af Amer: >60 mL/min (ref >60)
Glucose, Bld: 111 mg/dL — ABNORMAL HIGH (ref 65–99)
POTASSIUM: 4.2 mmol/L (ref 3.5–5.1)
SODIUM: 138 mmol/L (ref 135–145)

## 2016-05-17 LAB — CBC
HCT: 24.8 % — ABNORMAL LOW (ref 36.0–46.0)
HEMOGLOBIN: 8.2 g/dL — AB (ref 12.0–15.0)
MCH: 30.5 pg (ref 26.0–34.0)
MCHC: 33.1 g/dL (ref 30.0–36.0)
MCV: 92.2 fL (ref 78.0–100.0)
Platelets: 291 10*3/uL (ref 150–400)
RBC: 2.69 MIL/uL — AB (ref 3.87–5.11)
RDW: 13.2 % (ref 11.5–15.5)
WBC: 11.5 10*3/uL — AB (ref 4.0–10.5)

## 2016-05-17 MED ORDER — SILVER NITRATE-POT NITRATE 75-25 % EX MISC
1.0000 "application " | Freq: Once | CUTANEOUS | Status: AC
  Administered 2016-05-17: 1 via TOPICAL
  Filled 2016-05-17: qty 1

## 2016-05-17 NOTE — Progress Notes (Signed)
Patient ID: Sheila Willis, female   DOB: May 06, 1986, 30 y.o.   MRN: [id number]  General Surgery [facility]  Subjective: POD#2 patient in bed, family at bedside.  Complains of pain.  Ambulated in room twice yesterday.  Tolerating clear liquids.  Objective: Vital signs in last 24 hours: Temp:  [98.4 F (36.9 C)-99.5 F (37.5 C)] 98.6 F (37 C) (08/05 0517) Pulse Rate:  [88-106] 90 (08/05 0517) Resp:  [13-22] 22 (08/05 0750) BP: (109-117)/(68-89) 117/72 (08/05 0517) SpO2:  [96 %-100 %] 100 % (08/05 0750) Last BM Date: 05/15/16  Intake/Output from previous day: 08/04 0701 - 08/05 0700 In: 3170 [P.O.:120; I.V.:3000; IV Piggyback:50] Out: 3085 [Urine:3000; Drains:85] Intake/Output this shift: No intake/output data recorded.  Physical Exam: HEENT - sclerae clear, mucous membranes moist Neck - soft Abdomen - stoma viable; dressing removed and replaced; JP drain with thin serous Ext - no edema, non-tender Neuro - alert & oriented, no focal deficits  Lab Results:   Recent Labs  05/16/16 0505 05/17/16 0500  WBC 16.3* 11.5*  HGB 9.1* 8.2*  HCT 27.1* 24.8*  PLT 318 291   BMET  Recent Labs  05/16/16 0505 05/17/16 0500  NA 138 138  K 4.2 4.2  CL 105 107  CO2 26 26  GLUCOSE 131* 111*  BUN 6 <5*  CREATININE 1.21* 1.06*  CALCIUM 8.6* 8.3*   PT/INR  Recent Labs  05/15/16 0442  LABPROT 15.1  INR 1.18   Comprehensive Metabolic Panel:    Component Value Date/Time   NA 138 05/17/2016 0500   NA 138 05/16/2016 0505   K 4.2 05/17/2016 0500   K 4.2 05/16/2016 0505   CL 107 05/17/2016 0500   CL 105 05/16/2016 0505   CO2 26 05/17/2016 0500   CO2 26 05/16/2016 0505   BUN <5 (L) 05/17/2016 0500   BUN 6 05/16/2016 0505   CREATININE 1.06 (H) 05/17/2016 0500   CREATININE 1.21 (H) 05/16/2016 0505   GLUCOSE 111 (H) 05/17/2016 0500   GLUCOSE 131 (H) 05/16/2016 0505   CALCIUM 8.3 (L) 05/17/2016 0500   CALCIUM 8.6 (L) 05/16/2016 0505   AST 17  05/13/2016 0953   AST 19 04/24/2016 0557   ALT 9 (L) 05/13/2016 0953   ALT 10 (L) 04/24/2016 0557   ALKPHOS 108 05/13/2016 0953   ALKPHOS 71 04/24/2016 0557   BILITOT 0.8 05/13/2016 0953   BILITOT 0.5 04/24/2016 0557   PROT 8.8 (H) 05/13/2016 0953   PROT 5.1 (L) 04/24/2016 0557   ALBUMIN 3.5 05/13/2016 0953   ALBUMIN 2.1 (L) 04/24/2016 0557    Studies/Results: Dg C-arm 1-60 Min-no Report  Result Date: 05/15/2016 CLINICAL DATA: surgery- stent placement C-ARM 1-60 MINUTES Fluoroscopy was utilized by the requesting physician.  No radiographic interpretation.    Assessment & Plans: Status post Hartmann's resection and drainage of pelvic abscess  Begin BID dressing changes  Continue clear liquid diet  OOB, ambulate  Velora Heckler, MD, [facility] Office: [phone]   Lajuanda Penick Judie Petit 05/17/2016

## 2016-05-18 LAB — CULTURE, BLOOD (ROUTINE X 2)
Culture: NO GROWTH
Culture: NO GROWTH

## 2016-05-18 NOTE — Progress Notes (Signed)
Informed pt that there was order to change her abdominal dressing twice a day. Pt refused to let me change it because she said it hurts too bad to change. I explained benefits of having the dressing changed but pt still refused. Pt refuses to use incentive spirometer  . Benefits of use explained. Pt complaining of bloating. Encouraged pt to ambulate.  Sheila BertholdLemons, Sheila Willis

## 2016-05-18 NOTE — Progress Notes (Signed)
Patient ID: Sheila Willis, female   DOB: [birth year], 30 y.o.   MRN: [id number]  General Surgery [facility]  Subjective: POD#3  Patient in bed, mother at bedside.  Refusing dressing change and walking due to pain.  Wants to be "numb".  Objective: Vital signs in last 24 hours: Temp:  [98 F (36.7 C)-99.1 F (37.3 C)] 98.6 F (37 C) (08/06 0618) Pulse Rate:  [53-93] 53 (08/06 0618) Resp:  [10-22] 11 (08/06 0618) BP: (108-126)/(69-85) 111/72 (08/06 0618) SpO2:  [95 %-100 %] 95 % (08/06 0618) Last BM Date: 05/15/16  Intake/Output from previous day: 08/05 0701 - 08/06 0700 In: 3100 [I.V.:2900; IV Piggyback:200] Out: 4098 [[id number] [Urine:4425; Drains:60] Intake/Output this shift: No intake/output data recorded.  Physical Exam: HEENT - sclerae clear, mucous membranes moist Neck - soft Chest - clear bilaterally Cor - RRR Abdomen - soft without distension; quiet; small gas in ostomy bag, viable stoma; dressing dry and intact from yesterday morning change by MD Ext - no edema, non-tender Neuro - alert & oriented, no focal deficits  Lab Results:   Recent Labs  05/16/16 0505 05/17/16 0500  WBC 16.3* 11.5*  HGB 9.1* 8.2*  HCT 27.1* 24.8*  PLT 318 291   BMET  Recent Labs  05/16/16 0505 05/17/16 0500  NA 138 138  K 4.2 4.2  CL 105 107  CO2 26 26  GLUCOSE 131* 111*  BUN 6 <5*  CREATININE 1.21* 1.06*  CALCIUM 8.6* 8.3*   PT/INR No results for input(s): LABPROT, INR in the last 72 hours. Comprehensive Metabolic Panel:    Component Value Date/Time   NA 138 05/17/2016 0500   NA 138 05/16/2016 0505   K 4.2 05/17/2016 0500   K 4.2 05/16/2016 0505   CL 107 05/17/2016 0500   CL 105 05/16/2016 0505   CO2 26 05/17/2016 0500   CO2 26 05/16/2016 0505   BUN <5 (L) 05/17/2016 0500   BUN 6 05/16/2016 0505   CREATININE 1.06 (H) 05/17/2016 0500   CREATININE 1.21 (H) 05/16/2016 0505   GLUCOSE 111 (H) 05/17/2016 0500   GLUCOSE 131 (H) 05/16/2016 0505   CALCIUM  8.3 (L) 05/17/2016 0500   CALCIUM 8.6 (L) 05/16/2016 0505   AST 17 05/13/2016 0953   AST 19 04/24/2016 0557   ALT 9 (L) 05/13/2016 0953   ALT 10 (L) 04/24/2016 0557   ALKPHOS 108 05/13/2016 0953   ALKPHOS 71 04/24/2016 0557   BILITOT 0.8 05/13/2016 0953   BILITOT 0.5 04/24/2016 0557   PROT 8.8 (H) 05/13/2016 0953   PROT 5.1 (L) 04/24/2016 0557   ALBUMIN 3.5 05/13/2016 0953   ALBUMIN 2.1 (L) 04/24/2016 0557    Studies/Results: No results found.  Assessment & Plans: Status post Hartmann's resection and drainage of pelvic abscess                       Begin BID dressing changes - informed patient of necessity of dressing changes, unable to "numb" wound                       Continue clear liquid diet                       OOB, ambulate in halls today - family to asist  Velora Hecklerodd M. Madalee Altmann, MD, [facility] Office: [phone]   Casady Voshell Judie PetitM 05/18/2016

## 2016-05-18 NOTE — Progress Notes (Signed)
MD phoned back. Pt to rest and continue to use IS 10x an hour. Pt educated on importance of incentive.

## 2016-05-18 NOTE — Progress Notes (Signed)
Patient ID: Sheila Willis, female   DOB: Mar 29, 1986, 30 y.o.   MRN: [id number]          [facility]    Date of Admission:  05/13/2016           Day 5 piperacillin tazobactam  Sheila Willis is now afebrile on therapy for intra-abdominal abscess. Her white blood cell count has decreased and her renal function is improving. Blood cultures are negative. I will continue piperacillin tazobactam.         Cliffton AstersJohn Levon Boettcher, MD [facility] [phone] pager   [phone] cell 10/16/2015, 1:32 PM

## 2016-05-18 NOTE — Progress Notes (Signed)
Attempted to ambulate. O2 sat dropped to 72%. RR 22. BP 90/62. Pulse up to 130. MD notified. Will continue to monitor.

## 2016-05-19 DIAGNOSIS — N732 Unspecified parametritis and pelvic cellulitis: Secondary | ICD-10-CM

## 2016-05-19 DIAGNOSIS — R1030 Lower abdominal pain, unspecified: Secondary | ICD-10-CM

## 2016-05-19 LAB — CBC WITH DIFFERENTIAL/PLATELET
Basophils Absolute: 0 10*3/uL (ref 0.0–0.1)
Basophils Relative: 0 %
EOS ABS: 0.4 10*3/uL (ref 0.0–0.7)
EOS PCT: 4 %
HCT: 23.8 % — ABNORMAL LOW (ref 36.0–46.0)
HEMOGLOBIN: 7.6 g/dL — AB (ref 12.0–15.0)
LYMPHS ABS: 1.9 10*3/uL (ref 0.7–4.0)
LYMPHS PCT: 19 %
MCH: 29.1 pg (ref 26.0–34.0)
MCHC: 31.9 g/dL (ref 30.0–36.0)
MCV: 91.2 fL (ref 78.0–100.0)
MONOS PCT: 9 %
Monocytes Absolute: 0.8 10*3/uL (ref 0.1–1.0)
NEUTROS PCT: 68 %
Neutro Abs: 6.5 10*3/uL (ref 1.7–7.7)
Platelets: 332 10*3/uL (ref 150–400)
RBC: 2.61 MIL/uL — AB (ref 3.87–5.11)
RDW: 12.8 % (ref 11.5–15.5)
WBC: 9.5 10*3/uL (ref 4.0–10.5)

## 2016-05-19 MED ORDER — OXYCODONE-ACETAMINOPHEN 5-325 MG PO TABS: 1.0000 | ORAL_TABLET | ORAL | Status: DC | PRN

## 2016-05-19 MED ORDER — OXYCODONE HCL 5 MG PO TABS
5.0000 mg | ORAL_TABLET | ORAL | Status: DC | PRN
Start: 2016-05-19 — End: 2016-05-20
  Administered 2016-05-20: 10 mg via ORAL
  Filled 2016-05-19 (×2): qty 2

## 2016-05-19 MED ORDER — EPOETIN ALFA 20000 UNIT/ML IJ SOLN
15000.0000 [IU] | Freq: Once | INTRAMUSCULAR | Status: AC
  Administered 2016-05-19: 15000 [IU] via SUBCUTANEOUS
  Filled 2016-05-19: qty 1

## 2016-05-19 NOTE — Progress Notes (Signed)
    [facility]    Date of Admission:  05/13/2016   Total days of antibiotics 7        Day 7 piptazo          ID: Sheila Willis is a 30 y.o. female with complex, recurrent left tuob-ovarian abscess, and colocutaneous fistula POD#4 S/p ex lap with drainage of abscess and partial descending colectomy, appendectomy, L SOO, and partial R salpingectomy and colostomy placement and ureter stent placement Active Problems:   Intra-abdominal abscess (HCC)   Pelvic abscess in female    Subjective: She remains afebrile still significantly sore from abdominal/pelvic surgery. JP drain has serosanginous drainage in bulb. In ostomy - liquid brown stool  Objective: WBC normalized at 9.5  Medications:  . acetaminophen  1,000 mg Oral TID  . epoetin (EPOGEN/PROCRIT) injection  15,000 Units Subcutaneous Once  . famotidine (PEPCID) IV  20 mg Intravenous Q12H  . heparin subcutaneous  5,000 Units Subcutaneous Q8H  . HYDROmorphone   Intravenous Q4H  . lip balm  1 application Topical BID  . piperacillin-tazobactam (ZOSYN)  IV  3.375 g Intravenous Q8H    Objective: Vital signs in last 24 hours: Temp:  [97.7 F (36.5 C)-99.2 F (37.3 C)] 98 F (36.7 C) (08/07 1407) Pulse Rate:  [76-89] 82 (08/07 1407) Resp:  [9-23] 15 (08/07 1407) BP: (103-115)/(63-82) 108/82 (08/07 1407) SpO2:  [99 %-100 %] 100 % (08/07 1407) Physical Exam  Constitutional:  oriented to person, place, and time. appears well-developed and well-nourished. In mild distress. Intermittently sleeping during exam HENT: [place]/AT, PERRLA, no scleral icterus Mouth/Throat: Oropharynx is clear and moist. No oropharyngeal exudate.  Cardiovascular: regular rhythm and normal heart sounds. Exam reveals no gallop and no friction rub.  No murmur heard.  Pulmonary/Chest: Effort normal and breath sounds normal. No respiratory distress.  has no wheezes.  Neck = supple, no nuchal rigidity Abdominal: Soft. Bowel sounds are  present. There is RLQ drain with serosanginous fluid in bulb. And ostomy in Left Quadrant. Midline has bandage covering laparotomy Lymphadenopathy: no cervical adenopathy. No axillary adenopathy Neurological: alert and oriented to person, place, and time.  Skin: Skin is warm and dry. No rash noted. No erythema.  Psychiatric: sleeping  occasionally   Lab Results  Recent Labs  05/17/16 0500 05/19/16 0400  WBC 11.5* 9.5  HGB 8.2* 7.6*  HCT 24.8* 23.8*  NA 138  --   K 4.2  --   CL 107  --   CO2 26  --   BUN <5*  --   CREATININE 1.06*  --    Microbiology: 8/1 blood cx ngtd Studies/Results: No results found.   Assessment/Plan: Intra-abdominal abscess from colocutaneous fistula and recurrent left TOA = continue on piptazo. Recommend to finish out 5 day course tomorrow and then discontinue. Appear that we have source control. WBC has normalized  Abdominal pain, post surgery = continue with opiate PCA per primary team  AKI = improving. And recommend checking bmp tomorrow  Anemia = hgb down to 7.6. 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 She may also be open to getting IV iron which maybe an additional help in boosting iron stores in light of blood loss from surgery   Drue SecondSNIDER, [facility] Cell: [phone] Pager: [phone]  05/19/2016, 3:53 PM

## 2016-05-19 NOTE — Progress Notes (Addendum)
[facility] Office:  [phone] General Surgery Progress Note   LOS: 6 days  POD -  4 Days Post-Op  Assessment/Plan: 1.  EXPLORATORY LAPAROTOMY, DRAINAGE ABSCESS OF INTRA ABDOMINAL ABSCESS, partial  COLECTOMY AND  COLOSTOMY CYSTOSCOPY, incidental APPENDECTOMY - 05/15/2016 - Sheila Willis)  For left TOA  Zosyn - 8/4 >>>  Followed by Dr. Orvan Falconer from ID  On full liquids.  Ostomy functioning.  But not ready to advance diet.  1A.  Open wound is clean - needs BID dressing changes  She has resisted the dressing changes because of her pain  2.  Pain control issues  On larger doses of Dilaudid and Toradol  But will stop Toradol because of the risk of GI bleeding  Discussed these issues with the patient and her mother. 3.  Anemia  Hgb - 7.6 - 05/19/2016  Spoke to Sheila Willis in pharmacy - will give Epo (actually darbepoetin) 4.  [name]  Refuses blood transfusion 5.  DVT prophylaxis - SQ Heparin 6.  D/C foley   Active Problems:   Intra-abdominal abscess (HCC)   Pelvic abscess in female  Subjective:  Complains of pain - on dilaudid PCA, getting more dilaudid and toradol  Mother in room.  Objective:   Vitals:   05/19/16 1105 05/19/16 1129  BP: 107/73   Pulse: 85   Resp: 20 (!) 9  Temp: 99.2 F (37.3 C)      Intake/Output from previous day:  08/06 0701 - 08/07 0700 In: 2500 [I.V.:2500] Out: 4230 [Urine:4125; Drains:60; Stool:45]  Intake/Output this shift:  Total I/O In: -  Out: 860 [Urine:800; Drains:10; Stool:50]   Physical Exam:   General: WN F who is alert and oriented.    HEENT: Normal. Pupils equal. .   Lungs:  Modest inspiratory effort - IS about 1,100 cc   Abdomen: Few BS.     Wound: Ostomy in left abdomen.  Wound open. Drain in RLQ.   Lab Results:    Recent Labs  05/17/16 0500 05/19/16 0400  WBC 11.5* 9.5  HGB 8.2* 7.6*  HCT 24.8* 23.8*  PLT 291 332    BMET   Recent Labs  05/17/16 0500  NA 138  K 4.2  CL 107  CO2  26  GLUCOSE 111*  BUN <5*  CREATININE 1.06*  CALCIUM 8.3*    PT/INR  No results for input(s): LABPROT, INR in the last 72 hours.  ABG  No results for input(s): PHART, HCO3 in the last 72 hours.  Invalid input(s): PCO2, PO2   Studies/Results:  No results found.   Anti-infectives:   Anti-infectives    Start     Dose/Rate Route Frequency Ordered Stop   05/15/16 1600  piperacillin-tazobactam (ZOSYN) IVPB 3.375 g     3.375 g 12.5 mL/hr over 240 Minutes Intravenous Every 8 hours 05/15/16 1547     05/15/16 0830  cefoTEtan (CEFOTAN) 2 g in dextrose 5 % 50 mL IVPB     2 g 100 mL/hr over 30 Minutes Intravenous On call to O.R. 05/14/16 0920 05/15/16 0945   05/14/16 0930  cefoTEtan (CEFOTAN) 2 g in dextrose 5 % 50 mL IVPB  Status:  Discontinued     2 g 100 mL/hr over 30 Minutes Intravenous On call to O.R. 05/14/16 5643 05/14/16 0921   05/13/16 2200  vancomycin (VANCOCIN) IVPB 750 mg/150 ml premix  Status:  Discontinued     750 mg 150 mL/hr over 60 Minutes Intravenous Every 12 hours 05/13/16 1024 05/13/16 1529  05/13/16 1600  piperacillin-tazobactam (ZOSYN) IVPB 3.375 g  Status:  Discontinued     3.375 g 12.5 mL/hr over 240 Minutes Intravenous Every 8 hours 05/13/16 1024 05/15/16 1547   05/13/16 1015  vancomycin (VANCOCIN) IVPB 1000 mg/200 mL premix     1,000 mg 200 mL/hr over 60 Minutes Intravenous  Once 05/13/16 1006 05/13/16 1233   05/13/16 1015  piperacillin-tazobactam (ZOSYN) IVPB 3.375 g     3.375 g 100 mL/hr over 30 Minutes Intravenous  Once 05/13/16 1006 05/13/16 1104      Sheila Kinavid Clearence Vitug, MD, FACS Pager: [phone] [facility] Office: [phone] 05/19/2016

## 2016-05-19 NOTE — Consult Note (Signed)
WOC Nurse ostomy follow up Stoma type/location: LLQ Colostomy  Pouch is intact.  Mother at bedside.  Has been emptying the pouch for her.  Patient is flat and angry and states that she is in pain in her abdomen and back and does not want pouch change.  Mother agrees that this is not a good time. Will try again tomorrow.   Education provided: Discussed pouch change frequency, emptying and showering with ostomy.  [place] in [place] [employer] [place] Discharge program:No WOC team will follow.  Maple HudsonKaren Shakerra Red RN BSN CWON Pager [phone]

## 2016-05-20 DIAGNOSIS — R208 Other disturbances of skin sensation: Secondary | ICD-10-CM

## 2016-05-20 DIAGNOSIS — F11988 Opioid use, unspecified with other opioid-induced disorder: Secondary | ICD-10-CM

## 2016-05-20 DIAGNOSIS — R52 Pain, unspecified: Secondary | ICD-10-CM

## 2016-05-20 MED ORDER — PREGABALIN 50 MG PO CAPS
50.0000 mg | ORAL_CAPSULE | Freq: Every day | ORAL | Status: DC
  Filled 2016-05-20 (×2): qty 1

## 2016-05-20 MED ORDER — ENSURE ENLIVE PO LIQD
237.0000 mL | Freq: Three times a day (TID) | ORAL | Status: DC
  Administered 2016-05-20 – 2016-05-30 (×29): 237 mL via ORAL

## 2016-05-20 MED ORDER — METHYLNALTREXONE BROMIDE 12 MG/0.6ML ~~LOC~~ SOLN
8.0000 mg | Freq: Once | SUBCUTANEOUS | Status: DC
  Filled 2016-05-20: qty 0.6

## 2016-05-20 MED ORDER — LIDOCAINE 5 % EX PTCH
1.0000 | MEDICATED_PATCH | CUTANEOUS | Status: DC
  Administered 2016-05-20 – 2016-05-25 (×6): 1 via TRANSDERMAL
  Filled 2016-05-20 (×7): qty 1

## 2016-05-20 MED ORDER — DULOXETINE HCL 30 MG PO CPEP
30.0000 mg | ORAL_CAPSULE | Freq: Every day | ORAL | Status: DC
  Filled 2016-05-20 (×3): qty 1

## 2016-05-20 MED ORDER — NALOXONE HCL 0.4 MG/ML IJ SOLN: 0.4000 mg | INTRAMUSCULAR | Status: DC | PRN

## 2016-05-20 MED ORDER — ONDANSETRON HCL 4 MG/2ML IJ SOLN
4.0000 mg | Freq: Four times a day (QID) | INTRAMUSCULAR | Status: DC | PRN
  Administered 2016-05-22 – 2016-06-01 (×3): 4 mg via INTRAVENOUS
  Filled 2016-05-20 (×4): qty 2

## 2016-05-20 MED ORDER — CLONAZEPAM 0.5 MG PO TABS
0.5000 mg | ORAL_TABLET | Freq: Two times a day (BID) | ORAL | Status: DC
  Filled 2016-05-20 (×4): qty 1

## 2016-05-20 MED ORDER — SODIUM CHLORIDE 0.9% FLUSH: 9.0000 mL | INTRAVENOUS | Status: DC | PRN

## 2016-05-20 MED ORDER — FENTANYL 40 MCG/ML IV SOLN
INTRAVENOUS | Status: DC
  Administered 2016-05-20: 272.4 ug via INTRAVENOUS
  Administered 2016-05-20: 476.9 ug via INTRAVENOUS
  Administered 2016-05-20 – 2016-05-21 (×2): via INTRAVENOUS
  Administered 2016-05-21: 312.7 ug via INTRAVENOUS
  Administered 2016-05-21: 801 ug via INTRAVENOUS
  Administered 2016-05-21 (×2): via INTRAVENOUS
  Administered 2016-05-21: 738.6 ug via INTRAVENOUS
  Administered 2016-05-21: 400 ug via INTRAVENOUS
  Administered 2016-05-21: 11:00:00 via INTRAVENOUS
  Administered 2016-05-21: 332.5 ug via INTRAVENOUS
  Administered 2016-05-22: 05:00:00 via INTRAVENOUS
  Administered 2016-05-22: 546 ug via INTRAVENOUS
  Administered 2016-05-22: 1030 ug via INTRAVENOUS
  Administered 2016-05-22: 40 ug via INTRAVENOUS
  Administered 2016-05-22: 17:00:00 via INTRAVENOUS
  Administered 2016-05-22: 473.3 ug via INTRAVENOUS
  Administered 2016-05-22: 630.1 ug via INTRAVENOUS
  Administered 2016-05-22: 572.2 ug via INTRAVENOUS
  Administered 2016-05-23: 11:00:00 via INTRAVENOUS
  Administered 2016-05-23: 1167 ug via INTRAVENOUS
  Administered 2016-05-23: 15:00:00 via INTRAVENOUS
  Administered 2016-05-23: 40 ug via INTRAVENOUS
  Administered 2016-05-23: 1218 ug via INTRAVENOUS
  Administered 2016-05-23: 1018 ug via INTRAVENOUS
  Administered 2016-05-23: 1021 ug via INTRAVENOUS
  Administered 2016-05-24: 1030 ug via INTRAVENOUS
  Administered 2016-05-24 (×3): via INTRAVENOUS
  Administered 2016-05-24: 1024 ug via INTRAVENOUS
  Administered 2016-05-24: 347.1 ug via INTRAVENOUS
  Administered 2016-05-24: 353.3 ug via INTRAVENOUS
  Administered 2016-05-24: 23:00:00 via INTRAVENOUS
  Administered 2016-05-24: 1440 ug via INTRAVENOUS
  Administered 2016-05-25: 1021 ug via INTRAVENOUS
  Administered 2016-05-25: 18:00:00 via INTRAVENOUS
  Administered 2016-05-25: 720 ug via INTRAVENOUS
  Administered 2016-05-25 – 2016-05-26 (×4): via INTRAVENOUS
  Administered 2016-05-26: 657.1 ug via INTRAVENOUS
  Administered 2016-05-26: 885.4 ug via INTRAVENOUS
  Administered 2016-05-26: 1004 ug via INTRAVENOUS
  Administered 2016-05-26: 464.1 ug via INTRAVENOUS
  Administered 2016-05-26: 596.5 ug via INTRAVENOUS
  Administered 2016-05-27: 547.7 ug via INTRAVENOUS
  Administered 2016-05-27: 299.4 ug via INTRAVENOUS
  Administered 2016-05-27: via INTRAVENOUS
  Administered 2016-05-27: 497.6 ug via INTRAVENOUS
  Administered 2016-05-27: 613 ug via INTRAVENOUS
  Administered 2016-05-27: 409.8 ug via INTRAVENOUS
  Filled 2016-05-20 (×27): qty 25

## 2016-05-20 NOTE — Progress Notes (Signed)
[facility]  Patient Status: Active pt with [facility] prior to this readmission  [facility] is providing the following services: HHRN and [facility] for home IV ABX.  [facility] team will follow while inpatient to support home care needs as ordered at DC.  If patient discharges after hours, please call [phone].   Sheila Willis 05/20/2016, 8:15 AM

## 2016-05-20 NOTE — Progress Notes (Signed)
Patient refused 1000 scheduled Tylenol states " I cant take pills, because I dont think I can keep anything on my stomach."

## 2016-05-20 NOTE — Progress Notes (Addendum)
5 Days Post-Op  Subjective: I think pain control and her perception of pain is the overriding issue currently. Patient/family are refusing Tylenol. 12 mg of Dilaudid yesterday via the PCA 17 mg of Dilaudid by IV push. She is tearful and feels like we are not doing enough to address her pain. She doesn't want to walk be on the bathroom.  Objective: Vital signs in last 24 hours: Temp:  [98 F (36.7 C)-99.8 F (37.7 C)] 98.7 F (37.1 C) (08/08 0957) Pulse Rate:  [82-105] 89 (08/08 0957) Resp:  [9-20] 16 (08/08 0957) BP: (105-135)/(73-99) 123/89 (08/08 0957) SpO2:  [90 %-100 %] 100 % (08/08 0957) Last BM Date: 05/15/16 600 by mouth 2500 urine Drain 90 Stool 100 Afebrile vital signs are stable Anemia is stable Intake/Output from previous day: 08/07 0701 - 08/08 0700 In: 4102.1 [P.O.:600; I.V.:3502.1] Out: 2690 [Urine:2500; Drains:90; Stool:100] Intake/Output this shift: No intake/output data recorded.  General appearance: alert and She is tearful and appears very anxious and depressed. Resp: clear to auscultation bilaterally GI: Ostomy is working there are some feculent fluid and flatus in the bag. The open wound is clean. The drain is serosanguineous. There are some bowel sounds although she is fairly hypoactive.  Lab Results:   Recent Labs  05/19/16 0400  WBC 9.5  HGB 7.6*  HCT 23.8*  PLT 332    BMET No results for input(s): NA, K, CL, CO2, GLUCOSE, BUN, CREATININE, CALCIUM in the last 72 hours. PT/INR No results for input(s): LABPROT, INR in the last 72 hours.  No results for input(s): AST, ALT, ALKPHOS, BILITOT, PROT, ALBUMIN in the last 168 hours.   Lipase     Component Value Date/Time   LIPASE 26 05/13/2016 0953     Studies/Results: No results found.  Medications: . acetaminophen  1,000 mg Oral TID  . famotidine (PEPCID) IV  20 mg Intravenous Q12H  . heparin subcutaneous  5,000 Units Subcutaneous Q8H  . HYDROmorphone   Intravenous Q4H  . lip balm  1  application Topical BID  . piperacillin-tazobactam (ZOSYN)  IV  3.375 g Intravenous Q8H   . dextrose 5 % and 0.9 % NaCl with KCl 20 mEq/L 125 mL/hr at 05/20/16 [id number]    Assessment/Plan Recurrent/multiple  Intra-abdominal abscess, with sigmoid fistula on IR injection 04/24/16 Status post exploratory laparotomy, drainage of intra-abdominal abscesses, partial colectomy and colostomy. Cystoscopy and incidental appendectomy 05/15/16 Dr. Abbey Chattersosenbower   POD 5 Anemia - Jehovah witness - EPO Pain control Depression FEN:  Full liquids/IV fluids ID:  Day 8 Zosyn DVT:  Heparin   Plan: Wound looks fine she does have some feculent fluid in the ostomy bag with gas. We will advance to a soft diet discussed pain control with Dr. Ezzard StandingNewman.    LOS: 7 days    JENNINGS,WILLARD 05/20/2016 [phone]  Agree with above. Pain control the issue. Dr. Ernestina PennaFogleman at bedside.  Discussed some of the issues:  Pain, depression, open wound, care when she leaves (she is going home with mother). Will let mother start changing dressing. Drain recorded 90 cc yesterday.  Ovidio Kinavid Shaynah Hund, MD, [facility] Pager: [phone] Office phone:  [phone]

## 2016-05-20 NOTE — Consult Note (Signed)
WOC Nurse ostomy follow up Stoma type/location: LLQ Colostomy Stomal assessment/size: 1 and 1/2 inches, red, most, slightly budded.  Abdomen flaccid, must use skin barrier ring and apply mild traction to skin in the 12 o'clock position prior to applying pouching system Peristomal assessment: intact, clear Treatment options for stomal/peristomal skin: skin barrier ring and traction as noted above. Output: serosanguinous, small amount of red/brown effluent Ostomy pouching: 2pc. 2 and 1/4 inch pouching system with skin barrier ring Education provided: Patient and mother participating in pouching session, mother more than patient.  Observed pouch removal, stoma cleansing and sizing. Patient's mother traced pattern onto back of skin barrier, then cut it out.  I applied the skin barrier ring, then placed the skin barrier and attached pouch.  Patient observed Lock and Roll closure mechanism, then is able to give a return demo successfully. [place] in [place] [employer] Discharge program: Yes. Sent to alternate address that mother provided today: [address], [place], [place] [address]. 4 pouches (opaque with integrated gas filter), 4 skin barriers and 4 skin barrier rings requested. WOC nursing team will continue to follow, and will remain available to this patient, the nursing, surgical and medical teams.   Thanks, Ladona MowLaurie Shakyra Mattera, MSN, RN, GNP, Hans EdenCWOCN, CWON-AP, FAAN  Pager# [phone]

## 2016-05-20 NOTE — Progress Notes (Signed)
I have talked with Dr. Elonda HuskyGoldman and she is going to see and work with her.  In the meantime I am starting Cymbalta 30 mg daily, Lyrica 50 mg daily HS, and Lidoderm patch.  Dr. Phillips OdorGolding will address the narcotics.

## 2016-05-20 NOTE — Progress Notes (Signed)
Pt ambulated in hallway with this RN and NT.  Pt encourage continuously during ambulation.  HR 110-130 during ambulation and once back in bed back to 105.  Pt walked approx 6460ft and tolerated fairly.
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
## 2016-05-20 NOTE — Progress Notes (Signed)
Postop day #5 status post partial colectomy with colostomy and drainage of intra-abdominal abscesses including left salpingo-oophorectomy for left tubo-ovarian abscess.  Patient states "I don't  be want to here" patient expresses frustration with continued pain and nausea as well as lack of clarity over ultimate plan. Patient feels that her complaints of pain are not being adequately addressed. Patient continues on IV pain medicine and has been hesitant to use oral pain medicines due to poor by mouth intake and concern that this will aggravate her stomach. Patient had good relief with Toradol and is confused why she had to stop taking Toradol. Patient notes good void and notes stool and gas production in her ostomy bag. No vaginal bleeding. Patient notes back pain as well as incisional pain  Physical exam  Vitals:   05/20/16 0400 05/20/16 0630 05/20/16 0800 05/20/16 0957  BP:  (!) 135/99  123/89  Pulse:  88  89  Resp: Temp:  98.1 F (36.7 C)  98.7 F (37.1 C)  TempSrc:  Oral  Oral  SpO2: 100% 100% 100% 100%  Weight:      Height:       Gen.: Laying in bed tearful with depressed affect. Maintains adequate eye contact  Remainder of exam deferred   Assessment and plan: Postop day #5 status post exploratory laparoscopy with colectomy colostomy LSO and removal of intra-abdominal abscesses.   -  Infection controlled and patient managed by infectious disease currently on Zosyn.   - Postop. Pain control remains her biggest hurdle at this point. I discussed with patient she would need to be coming off of her IV pain medicine and transitioning to oral pain medicine in order to meet discharge criteria given that patient has been on narcotics for about 6 weeks I expect her to have developed a significant tolerance and will need higher than normal pain med dosages. Patient should be able to successfully transition to lower doses over time. I discussed with patient Tylenol would be a good  addition that has low risk of GI discomfort. Will also increase oral oxycodone as she tolerates by mouth. Patient has oral and IV antiemetics should she develop nausea and emesis and I encourage patient to continue to advance her diet. Will also add Ensure as  she has tolerated this in the past given her low oral intake. Resume heating pad for her back pain  - Anemia.  I've encouraged her to choose foods high in iron to help address her anemia. 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 She has been given EPO and I discussed with her the rationale for this. We've also discussed the rationale to stop her Toradol given her risk of GI bleeding in the setting of anemia and refusal of blood products. She expresses understanding now as to why the Toradol was stopped her primary team is considering IV iron infusion.   - Depression. This is affecting her ability to ambulate and progressed towards discharge goals. She has done well on SSRI in the past and would recommend restarting this today. Patient is hesitant but agreeable to this. I will check my office records to see what SSRI she did in the past and order that later today.   - Disposition. Per primary team patient may be able to be discharged later this week. I spent much of the visit today encouraging patient to progress towards her discharge goals. Patient's mother is willing to learn to do the dressing changes  and this will be started with the nurses later today. I discussed with the nurse setting out with discharge planner and social worker to help patient have what she needs for care at home and to start getting home health set up.   40 minutes spent on the floor discussing patient's depression and barriers to meeting her disposition goals. Patient seems more encouraged at the end of the visit and a plan has been made with her care team.   Sheila Willis A. 05/20/2016 1:25 PM  [phone] (cell)
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
## 2016-05-20 NOTE — Progress Notes (Signed)
Patient requests for additional Dilaudid, encouraged patient to try oral pain medication, she refuses stating, " I just want the Dilaudid, I cant keep anything on my stomach."  Mother at bedside demanding that she receive Dilaudid. Rates pain 10/10.

## 2016-05-21 LAB — CBC WITH DIFFERENTIAL/PLATELET
BASOS ABS: 0 10*3/uL (ref 0.0–0.1)
Basophils Relative: 0 %
EOS PCT: 2 %
Eosinophils Absolute: 0.2 10*3/uL (ref 0.0–0.7)
HEMATOCRIT: 25.8 % — AB (ref 36.0–46.0)
HEMOGLOBIN: 8.4 g/dL — AB (ref 12.0–15.0)
LYMPHS ABS: 1.9 10*3/uL (ref 0.7–4.0)
LYMPHS PCT: 15 %
MCH: 29.9 pg (ref 26.0–34.0)
MCHC: 32.6 g/dL (ref 30.0–36.0)
MCV: 91.8 fL (ref 78.0–100.0)
Monocytes Absolute: 1.4 10*3/uL — ABNORMAL HIGH (ref 0.1–1.0)
Monocytes Relative: 11 %
NEUTROS ABS: 9.4 10*3/uL — AB (ref 1.7–7.7)
NEUTROS PCT: 72 %
PLATELETS: 466 10*3/uL — AB (ref 150–400)
RBC: 2.81 MIL/uL — AB (ref 3.87–5.11)
RDW: 13.3 % (ref 11.5–15.5)
WBC: 12.9 10*3/uL — AB (ref 4.0–10.5)

## 2016-05-21 MED ORDER — FAMOTIDINE 20 MG PO TABS
20.0000 mg | ORAL_TABLET | Freq: Every day | ORAL | Status: DC
  Administered 2016-05-21 – 2016-05-30 (×10): 20 mg via ORAL
  Filled 2016-05-21 (×10): qty 1

## 2016-05-21 MED ORDER — SACCHAROMYCES BOULARDII 250 MG PO CAPS
250.0000 mg | ORAL_CAPSULE | Freq: Two times a day (BID) | ORAL | Status: DC
  Administered 2016-05-21 – 2016-06-05 (×24): 250 mg via ORAL
  Filled 2016-05-21 (×29): qty 1

## 2016-05-21 MED ORDER — PROCHLORPERAZINE MALEATE 5 MG PO TABS
5.0000 mg | ORAL_TABLET | Freq: Four times a day (QID) | ORAL | Status: DC | PRN
  Filled 2016-05-21: qty 1

## 2016-05-21 NOTE — Progress Notes (Signed)
Patient dressing changed by patient mother.  Wet to dry dressing to midline completed without difficulty.  Questions answered.

## 2016-05-21 NOTE — Progress Notes (Signed)
Noted improvement in her pain- she is using close to maximum doses of PCA from what I can determine [facility]. Plan is to transition her to a Duragesic patch once we figure out the lowest possible dose required for pain control. She is high risk for misuse once she leaves the hospital and will need once provider to assume control of making sure as she heals the amount of opiate being given also decreases accordingly.Her [place] Database report suggests 3 different prescribers of opiates in the past month.  Anderson MaltaElizabeth Fallan Mccarey, DO Palliative Medicine

## 2016-05-21 NOTE — Progress Notes (Signed)
6 Days Post-Op  Subjective: She seems to be 100 times better than yesterday.  She has some stool coming from her ostomy.  Mom is changing dressing and the ostomy bag.  She is tolerating soft diet.  She was up walking when I got to the floor.  Pain is "OK."  That is also a major improvement.  Wound looks good.  Objective: Vital signs in last 24 hours: Temp:  [99 F (37.2 C)-99.6 F (37.6 C)] 99.4 F (37.4 C) (08/09 0546) Pulse Rate:  [86-103] 89 (08/09 0546) Resp:  [14-19] 16 (08/09 0754) BP: (98-120)/(70-90) 108/70 (08/09 0546) SpO2:  [98 %-100 %] 100 % (08/09 0754) Last BM Date: 05/15/16 480 PO 4000 urine 50 from the drain No stool recorded Afebrile, VSS WBC is up some  Intake/Output from previous day: 08/08 0701 - 08/09 0700 In: 1930 [P.O.:480; I.V.:1000; IV Piggyback:450] Out: 4050 [Urine:4000; Drains:50] Intake/Output this shift: No intake/output data recorded.  General appearance: alert, cooperative and no distress Resp: clear to auscultation bilaterally GI: open site looks fine, ostomy working.  Drain is serous, but I have left it in for now.    Lab Results:   Recent Labs  05/19/16 0400 05/21/16 0330  WBC 9.5 12.9*  HGB 7.6* 8.4*  HCT 23.8* 25.8*  PLT 332 466*    BMET No results for input(s): NA, K, CL, CO2, GLUCOSE, BUN, CREATININE, CALCIUM in the last 72 hours. PT/INR No results for input(s): LABPROT, INR in the last 72 hours.  No results for input(s): AST, ALT, ALKPHOS, BILITOT, PROT, ALBUMIN in the last 168 hours.   Lipase     Component Value Date/Time   LIPASE 26 05/13/2016 0953     Studies/Results: No results found.  Medications: . acetaminophen  1,000 mg Oral TID  . clonazePAM  0.5 mg Oral BID  . DULoxetine  30 mg Oral Daily  . famotidine (PEPCID) IV  20 mg Intravenous Q12H  . feeding supplement (ENSURE ENLIVE)  237 mL Oral TID BM  . fentaNYL   Intravenous Q4H  . heparin subcutaneous  5,000 Units Subcutaneous Q8H  . lidocaine  1 patch  Transdermal Q24H  . lip balm  1 application Topical BID  . methylnaltrexone  8 mg Subcutaneous Once  . piperacillin-tazobactam (ZOSYN)  IV  3.375 g Intravenous Q8H  . pregabalin  50 mg Oral QHS    Assessment/Plan Recurrent/multiple  Intra-abdominal abscess, with sigmoid fistula on IR injection 04/24/16 Status post exploratory laparotomy, drainage of intra-abdominal abscesses, partial colectomy and colostomy. Cystoscopy and incidental appendectomy 05/15/16 Dr. Abbey Chattersosenbower   POD 6 Anemia - Jehovah witness - EPO Pain control Depression FEN:  soft diet /IV fluids - decrease IV fluids. ID:  Day 9 Zosyn DVT:  Heparin   Plan:  Appreciate Dr. Lamar BlinksGolding's assistance.  Big improvement from yesterday.   I will decrease fluids, and once we get her on PO pain meds I think we can plan to get her home.  I will check on stopping abx, add probiotic also.      LOS: 8 days    JENNINGS,WILLARD 05/21/2016 [phone]  Agree with above. Multiple meds are better controlling pain and depression. Her wound looks good. Her drain has put out 50 cc last 24 hours - will keep in because of elevated WBC.  Bump in WBC somewhat concerning.  Will repeat labs in AM.  Mom in room.  She is changing dressings.  Ovidio Kinavid Hairo Garraway, MD, [facility] Pager: [phone] Office phone:  [phone]

## 2016-05-22 ENCOUNTER — Inpatient Hospital Stay (HOSPITAL_COMMUNITY): Payer: Commercial Managed Care - PPO

## 2016-05-22 ENCOUNTER — Encounter (HOSPITAL_COMMUNITY): Payer: Self-pay | Admitting: Radiology

## 2016-05-22 DIAGNOSIS — N7093 Salpingitis and oophoritis, unspecified: Secondary | ICD-10-CM

## 2016-05-22 DIAGNOSIS — N1330 Unspecified hydronephrosis: Secondary | ICD-10-CM

## 2016-05-22 DIAGNOSIS — Z933 Colostomy status: Secondary | ICD-10-CM

## 2016-05-22 DIAGNOSIS — Z90721 Acquired absence of ovaries, unilateral: Secondary | ICD-10-CM

## 2016-05-22 DIAGNOSIS — Z9089 Acquired absence of other organs: Secondary | ICD-10-CM

## 2016-05-22 DIAGNOSIS — Z9049 Acquired absence of other specified parts of digestive tract: Secondary | ICD-10-CM

## 2016-05-22 DIAGNOSIS — D649 Anemia, unspecified: Secondary | ICD-10-CM

## 2016-05-22 DIAGNOSIS — Z9889 Other specified postprocedural states: Secondary | ICD-10-CM

## 2016-05-22 DIAGNOSIS — Z960 Presence of urogenital implants: Secondary | ICD-10-CM

## 2016-05-22 LAB — CBC
HEMATOCRIT: 26.5 % — AB (ref 36.0–46.0)
Hemoglobin: 8.6 g/dL — ABNORMAL LOW (ref 12.0–15.0)
MCH: 29.6 pg (ref 26.0–34.0)
MCHC: 32.5 g/dL (ref 30.0–36.0)
MCV: 91.1 fL (ref 78.0–100.0)
PLATELETS: 463 10*3/uL — AB (ref 150–400)
RBC: 2.91 MIL/uL — ABNORMAL LOW (ref 3.87–5.11)
RDW: 13.2 % (ref 11.5–15.5)
WBC: 15.5 10*3/uL — ABNORMAL HIGH (ref 4.0–10.5)

## 2016-05-22 LAB — COMPREHENSIVE METABOLIC PANEL
ALT: 7 U/L — AB (ref 14–54)
AST: 11 U/L — ABNORMAL LOW (ref 15–41)
Albumin: 2.5 g/dL — ABNORMAL LOW (ref 3.5–5.0)
Alkaline Phosphatase: 83 U/L (ref 38–126)
Anion gap: 9 (ref 5–15)
BILIRUBIN TOTAL: 0.5 mg/dL (ref 0.3–1.2)
BUN: 7 mg/dL (ref 6–20)
CHLORIDE: 99 mmol/L — AB (ref 101–111)
CO2: 28 mmol/L (ref 22–32)
CREATININE: 0.98 mg/dL (ref 0.44–1.00)
Calcium: 9.1 mg/dL (ref 8.9–10.3)
Glucose, Bld: 101 mg/dL — ABNORMAL HIGH (ref 65–99)
POTASSIUM: 4 mmol/L (ref 3.5–5.1)
Sodium: 136 mmol/L (ref 135–145)
TOTAL PROTEIN: 7 g/dL (ref 6.5–8.1)

## 2016-05-22 MED ORDER — CLONAZEPAM 0.5 MG PO TABS
0.5000 mg | ORAL_TABLET | Freq: Two times a day (BID) | ORAL | Status: DC | PRN
  Administered 2016-05-23 – 2016-06-03 (×11): 0.5 mg via ORAL
  Filled 2016-05-22 (×11): qty 1

## 2016-05-22 MED ORDER — DEXTROSE 5 % IV SOLN
2.0000 g | Freq: Two times a day (BID) | INTRAVENOUS | Status: DC
  Administered 2016-05-22 – 2016-06-05 (×28): 2 g via INTRAVENOUS
  Filled 2016-05-22 (×31): qty 2

## 2016-05-22 MED ORDER — MEDROXYPROGESTERONE ACETATE 150 MG/ML IM SUSP
150.0000 mg | Freq: Once | INTRAMUSCULAR | Status: DC
  Filled 2016-05-22 (×2): qty 1

## 2016-05-22 MED ORDER — DEXTROSE 5 % IV SOLN
100.0000 mg | Freq: Two times a day (BID) | INTRAVENOUS | Status: DC
  Administered 2016-05-22 – 2016-05-30 (×16): 100 mg via INTRAVENOUS
  Filled 2016-05-22 (×18): qty 100

## 2016-05-22 MED ORDER — IOPAMIDOL (ISOVUE-300) INJECTION 61%
100.0000 mL | Freq: Once | INTRAVENOUS | Status: AC | PRN
  Administered 2016-05-22: 100 mL via INTRAVENOUS

## 2016-05-22 NOTE — Progress Notes (Signed)
Patient is still refusing klonopin and other newly prescribed meds.  Stated she is not comfortable taking them.

## 2016-05-22 NOTE — Progress Notes (Addendum)
[facility]    Date of Admission:  05/13/2016   Total days of antibiotics 10        Day 10 piptazo          ID: Sheila Willis is a 30 y.o. female with complex, recurrent left tuob-ovarian abscess, and colocutaneous fistula POD#4 S/p ex lap with drainage of abscess and partial descending colectomy, appendectomy, L SOO, and partial R salpingectomy and colostomy placement and ureter stent placement Active Problems:   Intra-abdominal abscess (HCC)   Pelvic abscess in female    Subjective: She remains afebrile. Having better pain control, on PCA.  Objective: WBC continues to increase up to 15K, also elevated platelets in 400s  Medications:  . acetaminophen  1,000 mg Oral TID  . clonazePAM  0.5 mg Oral BID  . DULoxetine  30 mg Oral Daily  . famotidine  20 mg Oral QHS  . feeding supplement (ENSURE ENLIVE)  237 mL Oral TID BM  . fentaNYL   Intravenous Q4H  . heparin subcutaneous  5,000 Units Subcutaneous Q8H  . lidocaine  1 patch Transdermal Q24H  . lip balm  1 application Topical BID  . methylnaltrexone  8 mg Subcutaneous Once  . piperacillin-tazobactam (ZOSYN)  IV  3.375 g Intravenous Q8H  . pregabalin  50 mg Oral QHS  . saccharomyces boulardii  250 mg Oral BID    Objective: Vital signs in last 24 hours: Temp:  [98.8 F (37.1 C)-99.4 F (37.4 C)] 98.8 F (37.1 C) (08/10 0507) Pulse Rate:  [82-99] 90 (08/10 0507) Resp:  [11-20] 19 (08/10 0800) BP: (106-116)/(69-83) 116/83 (08/10 0507) SpO2:  [100 %] 100 % (08/10 0800) Physical Exam  Constitutional:  oriented to person, place, and time. appears well-developed and well-nourished. In mild distress. Intermittently sleeping during exam HENT: [place]/AT, PERRLA, no scleral icterus Mouth/Throat: Oropharynx is clear and moist. No oropharyngeal exudate.  Cardiovascular: regular rhythm and normal heart sounds. Exam reveals no gallop and no friction rub.  No murmur heard.  Pulmonary/Chest: Effort normal and  breath sounds normal. No respiratory distress.  has no wheezes.  Neck = supple, no nuchal rigidity Abdominal: Soft. Bowel sounds are present. There is RLQ drain with serosanginous fluid in bulb. And ostomy in Left Quadrant. Midline has bandage covering laparotomy Lymphadenopathy: no cervical adenopathy. No axillary adenopathy Neurological: alert and oriented to person, place, and time.  Skin: Skin is warm and dry. No rash noted. No erythema.  Psychiatric: sleeping  occasionally   Lab Results  Recent Labs  05/21/16 0330 05/22/16 0515  WBC 12.9* 15.5*  HGB 8.4* 8.6*  HCT 25.8* 26.5*  NA  --  136  K  --  4.0  CL  --  99*  CO2  --  28  BUN  --  7  CREATININE  --  0.98   Microbiology: 8/1 blood cx ngtd  Historic micro: gram stain showed gpr and gnr  Escherichia coli    MIC    AMPICILLIN >=32 RESIST... Resistant    AMPICILLIN/SULBACTAM 16 INTERMED... Intermediate    CEFAZOLIN <=4 SENSITIVE "><=4 SENSITIVE  Sensitive    CEFEPIME <=1 SENSITIVE "><=1 SENSITIVE  Sensitive    CEFTAZIDIME <=1 SENSITIVE "><=1 SENSITIVE  Sensitive    CEFTRIAXONE <=1 SENSITIVE "><=1 SENSITIVE  Sensitive    CIPROFLOXACIN <=0.25 SENSITIVE "><=0.25 SENS... Sensitive    GENTAMICIN >=16 RESIST... Resistant    IMIPENEM <=0.25 SENSITIVE "><=0.25 SENS... Sensitive    PIP/TAZO <=4 SENSITIVE "><=4 SENSITIVE  Sensitive    TRIMETH/SULFA >=320 RESIS... Resistant         Susceptibility Comments   Escherichia coli  ABUNDANT ESCHERICHIA COLI    Specimen Collected: 04/21/16 10:59 Last Resulted: 04/26/16 15:23                        Encounter   View Encounter           Studies/Results: abd ct: Postoperative changes related to recent partial colectomy and left lower quadrant colostomy, as above. Small low-attenuation rim enhancing gas and fluid collection immediately deep to the healing anterior abdominal wound. This may simply represent a resolving postoperative seroma, however, the possibility  of a small abscess in this region is not excluded. No other well-defined intra abdominal abscess is noted. 2. However, there is tubular dilatation of a low-attenuation rim enhancing structure in the left adnexa, which is concerning for persistent left-sided tubo-ovarian abscess this is surrounded by inflammatory mass (likely phlegmon involving the left ovary/adenxal structures), which is exerting significant mass effect upon adjacent structures, including the distal third of the left ureter which is resulting in moderate left-sided hydroureteronephrosis which is new compared to the prior examination. 3. Small amount of gas non dependently in the lumen of the urinary bladder. This is likely iatrogenic if there has been recent indwelling Foley catheter (no Foley catheter is noted on today's examination). In the absence of a history of recent catheterization, correlation with urinalysis would be recommended to exclude urinary tract infection with gas-forming organisms. 4. Additional incidental findings, as above.  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 Since WBC is trending up, will switch her abtx regimen coverage to include cefotetan (still provides coverage for prior e.coli isolate) plus doxycycline (covers actinomyces)   - will defer to surgery to whether any invasive procedure needed to address mass effect on left ureter caused by phlegmon.  Abdominal pain, post surgery = continue with management plan as recommended by Dr. Phillips Odor. For now, plan is to keep up with PCA and find her total fentanyl dosing to see if can transition to patch equivalent. Based upon doses of opiates she is receiving, she appears opiate tolerant.  AKI = improved. Continue to follow since recent CT shows moderate left sided  hydroureternephrosis.  Anemia = hgb at 8.6. Improved from nadir  Plans discussed with dr. Phillips Odor and patient  Drue [facility] Cell: [phone] Pager: [phone]  05/22/2016, 9:13 AM

## 2016-05-22 NOTE — Progress Notes (Signed)
S:  Pt notes feeling better than earlier in the week though felt better yesterday than today. Pt notes ate and walked more yesterday. Today, "just not feeling as well" and currently report no appetite though no N/V. Pt upset and tearful over CT results. Pain stable, no fever. No vaginal bleeding.  O: Vitals:   05/22/16 1451 05/22/16 1600 05/22/16 1758 05/22/16 2001  BP: (!) 116/98  108/67   Pulse: 99  96   Resp: (!) 22 11 12 13   Temp: 98.6 F (37 C)  99 F (37.2 C)   TempSrc: Oral  Oral   SpO2: 92% 97% 99% 100%  Weight:      Height:       Gen: alert, engaged in conversation, appropriately worried and tearful at points in conversation Abd: tender in LLQ, no rebound, no tympany, no guarding Inc: dressed, clean GU: deferred LE: SCDs in place  A/P: Postop day #7 status post exploratory laparoscopy with colectomy/colostomy LSO and removal of intra-abdominal abscesses. Initially recovering well from surgery with resolution of leukocytosis but continued to have difficulty with pain management and depression. Pt with improved affect (declined SSRI)  and improved pain control with help of palliative team. However, new increase in WBC prompted CT with concern for new abscess. Fallopian tube suspected etiology by CT scan. Given operative findings, I do not think Left fallopian tube or ovary remain. I will ask pathology to look again at the specimen to see if ovarian and tubal tissue was with the abscess specimen. The original pathology report only commented on colonic tissue and 'adhesion.' I suspect tube and ovary were included in the adhesion. Regardless of the tissue source, if there is a growing abscess that is not responding to the new expanded antibiotic coverage, pt may need repeat exploration if not amenable to IR drain.  Menstrual suppression. Pt is agreeable to Depo and will do that now with hopes of suppressing her menses that would be due in about 2 weeks.   Sheila Willis A. 05/22/2016  8:50 PM  [phone] (cell) Or on call MD for [facility]  30 min spent on floor reviewing images and discussing care with pt

## 2016-05-22 NOTE — Progress Notes (Signed)
Patient totally performed ostomy pouch opening and closing process with almost no assistance from anyone.  Mother was present in the room.  Patient verbalized the emptying process.  There was very scant amount of feces in the pouch, and it didn't really need emptying.

## 2016-05-22 NOTE — Progress Notes (Signed)
7 Days Post-Op  Subjective: She walked 4 times yesterday.Pain is better.  She is tolerating diet and ostomy is working.  She is smiling some now, her mom said she didn't sleep, but it was because the PCA kept going off.  Drainage is clear serosanguineous fluid.  Objective: Vital signs in last 24 hours: Temp:  [98.8 F (37.1 C)-99.4 F (37.4 C)] 98.8 F (37.1 C) (08/10 0507) Pulse Rate:  [82-99] 90 (08/10 0507) Resp:  [11-16] 12 (08/10 0507) BP: (106-116)/(69-83) 116/83 (08/10 0507) SpO2:  [100 %] 100 % (08/10 0507) Last BM Date: 05/15/16 700 by mouth recorded To 80 stool recorded 2600 urine recorded Drain 10 ml Afebrile vital signs are stable WBC is trending back up 9.5 on 8/7, 12.9 on 8/9, today is up to 15.5 H/H is stable CMP is stable  Intake/Output from previous day: 08/09 0701 - 08/10 0700 In: 700 [I.V.:700] Out: 2890 [Urine:2600; Drains:10; Stool:280] Intake/Output this shift: No intake/output data recorded.  General appearance: alert, cooperative and no distress GI: Soft, sore, I did not look at the open wound this morning. Positive bowel sounds with good ostomy function.  Lab Results:   Recent Labs  05/21/16 0330 05/22/16 0515  WBC 12.9* 15.5*  HGB 8.4* 8.6*  HCT 25.8* 26.5*  PLT 466* 463*    BMET  Recent Labs  05/22/16 0515  NA 136  K 4.0  CL 99*  CO2 28  GLUCOSE 101*  BUN 7  CREATININE 0.98  CALCIUM 9.1   PT/INR No results for input(s): LABPROT, INR in the last 72 hours.   Recent Labs Lab 05/22/16 0515  AST 11*  ALT 7*  ALKPHOS 83  BILITOT 0.5  PROT 7.0  ALBUMIN 2.5*     Lipase     Component Value Date/Time   LIPASE 26 05/13/2016 0953     Studies/Results: No results found.  Medications: . acetaminophen  1,000 mg Oral TID  . clonazePAM  0.5 mg Oral BID  . DULoxetine  30 mg Oral Daily  . famotidine  20 mg Oral QHS  . feeding supplement (ENSURE ENLIVE)  237 mL Oral TID BM  . fentaNYL   Intravenous Q4H  . heparin  subcutaneous  5,000 Units Subcutaneous Q8H  . lidocaine  1 patch Transdermal Q24H  . lip balm  1 application Topical BID  . methylnaltrexone  8 mg Subcutaneous Once  . piperacillin-tazobactam (ZOSYN)  IV  3.375 g Intravenous Q8H  . pregabalin  50 mg Oral QHS  . saccharomyces boulardii  250 mg Oral BID    Assessment/Plan Recurrent/multiple Intra-abdominal abscess,with sigmoid fistula on IR injection 04/24/16 Status post exploratory laparotomy, drainage of intra-abdominal abscesses, partial colectomy and colostomy. Cystoscopy and incidental appendectomy 05/15/16 Dr. Abbey Chattersosenbower POD 7 Anemia - Jehovah witness - EPO Pain control Depression FEN: soft diet /IV fluids - decrease IV fluids. ID: Day 10 Zosyn DVT: Heparin   Plan: Going to repeat the CT scan today because of the rising WBC.      LOS: 9 days    JENNINGS,WILLARD 05/22/2016 [phone]  Agree with above. CT scan pending.  She looks good - but WBC elevated. Mother in room.  Ovidio Kinavid Anapaola Kinsel, MD, [facility] Pager: [phone] Office phone:  [phone]

## 2016-05-23 LAB — CBC
HEMATOCRIT: 26.9 % — AB (ref 36.0–46.0)
Hemoglobin: 8.9 g/dL — ABNORMAL LOW (ref 12.0–15.0)
MCH: 30.1 pg (ref 26.0–34.0)
MCHC: 33.1 g/dL (ref 30.0–36.0)
MCV: 90.9 fL (ref 78.0–100.0)
Platelets: 503 10*3/uL — ABNORMAL HIGH (ref 150–400)
RBC: 2.96 MIL/uL — ABNORMAL LOW (ref 3.87–5.11)
RDW: 13.4 % (ref 11.5–15.5)
WBC: 15.2 10*3/uL — ABNORMAL HIGH (ref 4.0–10.5)

## 2016-05-23 NOTE — Progress Notes (Addendum)
8 Days Post-Op  Subjective: She is complaining of pain left upper quadrant this a.m. that is new. She said it actually felt better with palpating it. Her ostomy is working well, she is tolerating her diet. She is pretty upset over the new CT findings. On exam there is really nothing new.  Objective: Vital signs in last 24 hours: Temp:  [98 F (36.7 C)-100 F (37.8 C)] 98 F (36.7 C) (08/11 1235) Pulse Rate:  [95-108] 96 (08/11 1235) Resp:  [10-22] 16 (08/11 1235) BP: (103-118)/(62-98) 118/75 (08/11 1235) SpO2:  [92 %-100 %] 100 % (08/11 1235) Last BM Date: 05/23/16 (colostomy) By mouth 680 Drain 15 mL, what is in there now is clear. Stool: 160 recorded She remains afebrile, VSS Her labs show white count is stable at 15.2. CT scan 05/22/16: Postop changes secondary to recent partial colectomy and left lower quadrant colostomy small rim of fluid anterior abdomen. There is a tubular dilatation of the left adnexa concerning for a left-sided tubo-ovarian abscess surrounded by inflammatory mass that looks like a phlegmon involving the left ovary/adnexal structure. This is exerting a mass effect upon the structure including distal third of the left ureter which is resulting in some left-sided hydroureteronephrosis. This was new and not on the prior exams. Intake/Output from previous day: 08/10 0701 - 08/11 0700 In: 2130 [P.O.:680; I.V.:1100; IV Piggyback:350] Out: 1875 [Urine:1700; Drains:15; Stool:160] Intake/Output this shift: Total I/O In: 120 [P.O.:120] Out: 1005 [Urine:1000; Drains:5]  General appearance: alert, cooperative, no distress and Anxious Resp: clear to auscultation bilaterally GI: Soft sore, complaining of pain now in the left upper quadrant that is new. Actually it improves with palpitation. Her ostomy is working well incision looks fine.  Lab Results:   Recent Labs  05/22/16 0515 05/23/16 0855  WBC 15.5* 15.2*  HGB 8.6* 8.9*  HCT 26.5* 26.9*  PLT 463* 503*     BMET  Recent Labs  05/22/16 0515  NA 136  K 4.0  CL 99*  CO2 28  GLUCOSE 101*  BUN 7  CREATININE 0.98  CALCIUM 9.1   PT/INR No results for input(s): LABPROT, INR in the last 72 hours.   Recent Labs Lab 05/22/16 0515  AST 11*  ALT 7*  ALKPHOS 83  BILITOT 0.5  PROT 7.0  ALBUMIN 2.5*     Lipase     Component Value Date/Time   LIPASE 26 05/13/2016 0953     Studies/Results: Ct Abdomen Pelvis W Contrast  Result Date: 05/22/2016 CLINICAL DATA:  30 year old female with history of elevated white blood cell count. History of complicated tubo-ovarian abscess, status post partial colectomy and left lower quadrant colostomy. Intra abdominal abscesses with multiple pelvic drains. EXAM: CT ABDOMEN AND PELVIS WITH CONTRAST TECHNIQUE: Multidetector CT imaging of the abdomen and pelvis was performed using the standard protocol following bolus administration of intravenous contrast. CONTRAST:  ISOVUE-300 IOPAMIDOL (ISOVUE-300) INJECTION 61% COMPARISON:  CT of the abdomen and pelvis 05/13/2016. FINDINGS: Lower chest: Areas of dependent subsegmental atelectasis are noted in the lung bases bilaterally. Hepatobiliary: No cystic or solid hepatic lesions. No intra or extrahepatic biliary ductal dilatation. Small amount of high attenuation material lying dependently in the gallbladder may reflect biliary sludge and/or tiny gallstones. No findings to suggest an acute cholecystitis at this time. Pancreas: No pancreatic mass. No pancreatic ductal dilatation. No pancreatic or peripancreatic fluid or inflammatory changes. Spleen: Unremarkable. Adrenals/Urinary Tract: Compared to the prior examination there is new moderate left-sided hydronephrosis, with extensive left-sided periureteric soft tissue  stranding. This appears related to extrinsic compression from the inflammatory mass in the left adnexal region (discussed below), which appears to compress the left ureter (best appreciated on images 58  63 of series 2). Bilateral kidneys are otherwise normal in appearance, as are the adrenal glands bilaterally. Subjectively, there is delayed per fusion of the left kidney, however, delayed images were not obtained on today's examination. No right-sided hydroureteronephrosis. Urinary bladder is remarkable for some nondependent gas. No indwelling Foley catheter is noted. Stomach/Bowel: Normal appearance of the stomach. No pathologic dilatation of small bowel or colon. Postoperative changes of partial colectomy with left lower quadrant colostomy are new compared to the prior examination. Suture line noted in the left lower quadrant, with a long Hartmann's pouch distal to it. Status post appendectomy. Vascular/Lymphatic: No significant atherosclerotic disease, aneurysm or dissection identified in the abdominal or pelvic vasculature. No lymphadenopathy noted in the abdomen or pelvis. Reproductive: Uterus and right ovary are unremarkable in appearance. The left adnexal region is grossly distorted, by what appears to be an inflammatory mass. Accurate discrimination of the ovary within this region is not possible. There is a tubular shaped low-attenuation area which is best appreciated on coronal image 60 of series 603, which demonstrates some peripheral enhancement, concerning for residual tubo-ovarian abscess. This is also appreciated on axial images 64-69 of series 2. Surrounding this there is extensive inflammation which has an appearance suggestive of a phlegmon. This exerts significant mass effect upon the left pelvic sidewall, most notably causing significant compression of the distal third of the left ureter. Other: Surgical drain in the low anatomic pelvis, injuring the right anterior pelvic wall, and extending into the cul-de-sac region, with tip terminating in the lower left hemipelvis anteriorly. Small low-attenuation rim enhancing fluid collection with internal locules of gas immediately deep to the anterior  abdominal wall incision in the low anatomic pelvis (axial image 61 of series 2 and sagittal image 82 of series 602). No other well-defined rim enhancing fluid collections are noted in the abdomen or pelvis. No pneumoperitoneum. Musculoskeletal: Midline anterior abdominal wound appears to be closing via a secondary intention. There are no aggressive appearing lytic or blastic lesions noted in the visualized portions of the skeleton. IMPRESSION: 1. Postoperative changes related to recent partial colectomy and left lower quadrant colostomy, as above. Small low-attenuation rim enhancing gas and fluid collection immediately deep to the healing anterior abdominal wound. This may simply represent a resolving postoperative seroma, however, the possibility of a small abscess in this region is not excluded. No other well-defined intra abdominal abscess is noted. 2. However, there is tubular dilatation of a low-attenuation rim enhancing structure in the left adnexa, which is concerning for persistent left-sided tubo-ovarian abscess this is surrounded by inflammatory mass (likely phlegmon involving the left ovary/adenxal structures), which is exerting significant mass effect upon adjacent structures, including the distal third of the left ureter which is resulting in moderate left-sided hydroureteronephrosis which is new compared to the prior examination. 3. Small amount of gas non dependently in the lumen of the urinary bladder. This is likely iatrogenic if there has been recent indwelling Foley catheter (no Foley catheter is noted on today's examination). In the absence of a history of recent catheterization, correlation with urinalysis would be recommended to exclude urinary tract infection with gas-forming organisms. 4. Additional incidental findings, as above. Electronically Signed   By: Trudie Reed M.D.   On: 05/22/2016 09:44    Medications: . acetaminophen  1,000 mg Oral TID  .  cefoTEtan (CEFOTAN) IV  2 g  Intravenous Q12H  . doxycycline (VIBRAMYCIN) IV  100 mg Intravenous Q12H  . DULoxetine  30 mg Oral Daily  . famotidine  20 mg Oral QHS  . feeding supplement (ENSURE ENLIVE)  237 mL Oral TID BM  . fentaNYL   Intravenous Q4H  . heparin subcutaneous  5,000 Units Subcutaneous Q8H  . lidocaine  1 patch Transdermal Q24H  . lip balm  1 application Topical BID  . medroxyPROGESTERone  150 mg Intramuscular Once  . methylnaltrexone  8 mg Subcutaneous Once  . saccharomyces boulardii  250 mg Oral BID    Assessment/Plan Recurrent/multiple Intra-abdominal abscess,with sigmoid fistula on IR injection 04/24/16 Status post exploratory laparotomy, drainage of intra-abdominal abscesses, partial colectomy and colostomy. Cystoscopy and incidental appendectomy 05/15/16 Dr. Abbey Chattersosenbower POD 8 Anemia - Jehovah witness - EPO Pain control Depression FEN: soft diet /IV fluids - decrease IV f ID: Day [id number] 05/22/16, discontinued by ID. Cefotetan and doxycycline started 05/22/16 - Dr. Drue SecondSnider ID DVT: Heparin  Plan: Continue new antibiotic course we will recheck her CBC on Monday. She is resistant to any change in her IV fentanyl pain control.  Appreciate Dr. Lamar BlinksGolding's assistance with this. Continue conservative treatment for now.     LOS: 10 days    JENNINGS,WILLARD 05/23/2016 [id number]  Agree with above.  CT scan of abdomen yesterday (05/22/2016) - 1. Postoperative changes related to recent partial colectomy and left lower quadrant colostomy, as above. Small low-attenuation rim enhancing gas and fluid collection immediately deep to the healing anterior abdominal wound. This may simply represent a resolving postoperative seroma, however, the possibility of a small abscess in this region is not excluded. No other well-defined intra abdominal abscess is noted.  2. However, there is tubular dilatation of a low-attenuation rim enhancing structure in the left adnexa, which is concerning for persistent  left-sided tubo-ovarian abscess this is surrounded by inflammatory mass (likely phlegmon involving the left ovary/adenxal structures), which is exerting significant mass effect upon adjacent structures, including the distal third of the left ureter which is resulting in moderate left-sided hydroureteronephrosis which is new compared to the prior examination.  She is taking Klonopin (prn), but not taking cymbalta.  I talked to Dr. Drue SecondSnider, who has switched her antibiotics around to cover some gyn pathology - Doxycycline and Cefotetan.  She is complaining of some pain along her left side/flank. She does have some hydro on the left side.  I will talk to urology about these findings.  Ovidio Kinavid Leroi Haque, MD, [facility] Pager: [phone] Office phone:  [phone]

## 2016-05-23 NOTE — Progress Notes (Signed)
[facility]    Date of Admission:  05/13/2016   Total days of antibiotics 11        Day 2 cefotetan/doxy          ID: Sheila Willis is a 30 y.o. female with complex, recurrent left tubo-ovarian abscess, and colocutaneous fistula POD#4 S/p ex lap with drainage of abscess and partial descending colectomy, appendectomy, , and partial R salpingectomy and colostomy placement and ureter stent placement Active Problems:   Intra-abdominal abscess (HCC)   Pelvic abscess in female    Subjective: She remains afebrile. Having more left sided abdominal pain. Her mother reports little oral intake today due to painHaving better pain control, on PCA.  Objective: WBC at 15K, also elevated platelets in 400s  Medications:  . acetaminophen  1,000 mg Oral TID  . cefoTEtan (CEFOTAN) IV  2 g Intravenous Q12H  . doxycycline (VIBRAMYCIN) IV  100 mg Intravenous Q12H  . DULoxetine  30 mg Oral Daily  . famotidine  20 mg Oral QHS  . feeding supplement (ENSURE ENLIVE)  237 mL Oral TID BM  . fentaNYL   Intravenous Q4H  . heparin subcutaneous  5,000 Units Subcutaneous Q8H  . lidocaine  1 patch Transdermal Q24H  . lip balm  1 application Topical BID  . medroxyPROGESTERone  150 mg Intramuscular Once  . methylnaltrexone  8 mg Subcutaneous Once  . saccharomyces boulardii  250 mg Oral BID    Objective: Vital signs in last 24 hours: Temp:  [98 F (36.7 C)-100 F (37.8 C)] 98 F (36.7 C) (08/11 1235) Pulse Rate:  [95-108] 96 (08/11 1235) Resp:  [10-18] 18 (08/11 1359) BP: (103-118)/(62-75) 118/75 (08/11 1235) SpO2:  [95 %-100 %] 95 % (08/11 1359) Physical Exam  Constitutional:  oriented to person, place, and time. appears well-developed and well-nourished. In mild distress. Intermittently sleeping during exam HENT: [place]/AT, PERRLA, no scleral icterus Mouth/Throat: Oropharynx is clear and moist. No oropharyngeal exudate.  Cardiovascular: regular rhythm and normal heart sounds.  Exam reveals no gallop and no friction rub.  No murmur heard.  Pulmonary/Chest: Effort normal and breath sounds normal. No respiratory distress.  has no wheezes.  Neck = supple, no nuchal rigidity Abdominal: Soft. Bowel sounds are present. There is RLQ drain with serosanginous fluid in bulb. And ostomy in Left Quadrant. Midline has bandage covering laparotomy Lymphadenopathy: no cervical adenopathy. No axillary adenopathy Neurological: alert and oriented to person, place, and time.  Skin: Skin is warm and dry. No rash noted. No erythema.  Psychiatric: sleeping  occasionally   Lab Results  Recent Labs  05/22/16 0515 05/23/16 0855  WBC 15.5* 15.2*  HGB 8.6* 8.9*  HCT 26.5* 26.9*  NA 136  --   K 4.0  --   CL 99*  --   CO2 28  --   BUN 7  --   CREATININE 0.98  --    Microbiology: 8/1 blood cx ngtd  Historic micro: gram stain showed gpr and gnr  Escherichia coli    MIC    AMPICILLIN >=32 RESIST... Resistant    AMPICILLIN/SULBACTAM 16 INTERMED... Intermediate    CEFAZOLIN <=4 SENSITIVE "><=4 SENSITIVE  Sensitive    CEFEPIME <=1 SENSITIVE "><=1 SENSITIVE  Sensitive    CEFTAZIDIME <=1 SENSITIVE "><=1 SENSITIVE  Sensitive    CEFTRIAXONE <=1 SENSITIVE "><=1 SENSITIVE  Sensitive    CIPROFLOXACIN <=0.25 SENSITIVE "><=0.25 SENS... Sensitive    GENTAMICIN >=16 RESIST... Resistant    IMIPENEM <=0.25 SENSITIVE "><=  0.25 SENS... Sensitive    PIP/TAZO <=4 SENSITIVE "><=4 SENSITIVE  Sensitive    TRIMETH/SULFA >=320 RESIS... Resistant         Susceptibility Comments   Escherichia coli  ABUNDANT ESCHERICHIA COLI    Specimen Collected: 04/21/16 10:59 Last Resulted: 04/26/16 15:23                      Encounter   View Encounter           Studies/Results: abd ct: Postoperative changes related to recent partial colectomy and left lower quadrant colostomy, as above. Small low-attenuation rim enhancing gas and fluid collection immediately deep to the healing anterior  abdominal wound. This may simply represent a resolving postoperative seroma, however, the possibility of a small abscess in this region is not excluded. No other well-defined intra abdominal abscess is noted. 2. However, there is tubular dilatation of a low-attenuation rim enhancing structure in the left adnexa, which is concerning for persistent left-sided tubo-ovarian abscess this is surrounded by inflammatory mass (likely phlegmon involving the left ovary/adenxal structures), which is exerting significant mass effect upon adjacent structures, including the distal third of the left ureter which is resulting in moderate left-sided hydroureteronephrosis which is new compared to the prior examination. 3. Small amount of gas non dependently in the lumen of the urinary bladder. This is likely iatrogenic if there has been recent indwelling Foley catheter (no Foley catheter is noted on today's examination). In the absence of a history of recent catheterization, correlation with urinalysis would be recommended to exclude urinary tract infection with gas-forming organisms. 4. Additional incidental findings, as above.  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 Path report suggested only colonic tissue. Due to increasing WBC, and repeat CT scan showing residual adenexal abscess her abtx regimen was changed on 8/9 from piptazo to cefotetan (still provides coverage for prior e.coli isolate) plus doxycycline (covers actinomyces)   - will defer to surgery to whether any invasive procedure needed to address mass effect on left ureter caused by phlegmon. For now it makes sense to see if change in antibiotics will help scenario  Abdominal pain, post surgery = continue with management plan as recommended by Dr. Phillips OdorGolding. For now, plan is to keep up with PCA and find her  total fentanyl dosing to see if can transition to patch equivalent.   AKI = improved. Continue to follow since recent CT shows moderate left sided hydroureternephrosis.  Anemia = hgb at 8.6. stable  Dr  Daiva EvesVan Dam available for questions over the weekend.  I will see back on Monday  [facility], [facility] Cell: [phone] Pager: [phone]  05/23/2016, 3:36 PM

## 2016-05-23 NOTE — Progress Notes (Signed)
Patient is requesting topical anesthetic before receiving IM depo shot ordered by Dr. Ernestina PennaFogleman.

## 2016-05-23 NOTE — Progress Notes (Signed)
WOC Nurse ostomy follow up Stoma type/location: LLQ colostomy  Stomal assessment/size: not measured Peristomal assessment: not assessed Treatment options for stomal/peristomal skin: Not applicable Output Scant amount brown, thin Ostomy pouching: 2pc.  Education provided: Patient independently performed opening & closing of pouch and verbalized emptying process.  Patient's mother present in room.  Discussed POC with patient and bedside nurse.   Thanks Helmut MusterSherry Rossi Silvestro MSN, RN, CNS-BC, [employer]OCN

## 2016-05-23 NOTE — Progress Notes (Addendum)
Extensive conversation with Teia this afternoon. I went over my recommendations for her pain control and symptom management. Dr. Drue SecondSnider and I discussed the VT results with her and this was difficult for her to handle-she is growing increasingly frustrated. While her pain is improved on very high doses of fentanyl I hesitate to make any changes at this point since we may be looking at additional surgical intervention-the hope is that changing the abx regimen will help. As her abscess improves I would expect her pain to decrease as well. Our goal should be to get her on the lowest possible dose of opiate that is effective. She refuses any of the adjuvant med options I presented because she is afraid they will "hurt her kidneys". She also has refused the relistor which I strongly recommended for constipation- I gently educated on this since she thought increased activity is all that she would need to do.  I will continue to follow her pain management for now.   Time: 8AM-845AM Total: 45 minutes Greater than 50%  of this time was spent counseling and coordinating care related to the above assessment and plan.   Anderson MaltaElizabeth Golding, DO Palliative Medicine

## 2016-05-23 NOTE — Consult Note (Signed)
Reason for Consult: Left Hydronephrosis with Flank Pain  Referring Physician: Alphonsa Overall MD  Sheila Willis is an 30 y.o. female.   HPI:   1 - Left Hydronephrosis with Flank Pain - pt with progressive left hydro over serial imaging since 03/2016 during complex course for tubo-ovarian abscess / colon injury / colostomy. She had externalized temporary stent placed by Dr. Karsten Ro 05/15/16 to aide in ureteral identification during segmental colon resection, but that has since been removed.  CT 8/10 with progressive hydro to distal ureter / area of prior inflammation / phlegmon and progressive left CVAT. Ct <1. Most recent UCX negative, though she does have some on / off low grade fevers.  Today "Sheila Willis" is seen in consultation for above.   Past Medical History:  Diagnosis Date  . Depression   . Gestational diabetes   . Headache(784.0)   . PCOS (polycystic ovarian syndrome)     Past Surgical History:  Procedure Laterality Date  . APPENDECTOMY  05/15/2016   Procedure: incidental APPENDECTOMY;  Surgeon: Jackolyn Confer, MD;  Location: [facility];  Service: General;;  . COLECTOMY WITH COLOSTOMY CREATION/HARTMANN PROCEDURE N/A 05/15/2016   Procedure: partial  COLECTOMY AND  COLOSTOMY;  Surgeon: Jackolyn Confer, MD;  Location: [facility];  Service: General;  Laterality: N/A;  . CYSTOSCOPY W/ URETERAL STENT PLACEMENT Left 05/15/2016   Procedure: CYSTOSCOPY;  Surgeon: Kathie Rhodes, MD;  Location: [facility];  Service: Urology;  Laterality: Left;  . INCISION AND DRAINAGE ABSCESS N/A 05/15/2016   Procedure: DRAINAGE ABSCESS OF INTRA ABDOMINAL ABSCESS;  Surgeon: Jackolyn Confer, MD;  Location: [facility];  Service: General;  Laterality: N/A;  . LAPAROTOMY N/A 05/15/2016   Procedure: EXPLORATORY LAPAROTOMY;  Surgeon: Jackolyn Confer, MD;  Location: [facility];  Service: General;  Laterality: N/A;  . NO PAST SURGERIES      Family History  Problem Relation Age of Onset  . Cancer Other   . Hypertension Other   . Stroke Other    . Heart attack Other   . Heart disease Mother   . Hypertension Mother   . Cancer Mother     ovarian    Social History:  reports that she quit smoking about 6 weeks ago. Her smoking use included Cigarettes. She smoked 0.50 packs per day. She has never used smokeless tobacco. She reports that she does not drink alcohol or use drugs.  Allergies:  Allergies  Allergen Reactions  . Latex Itching    Medications: I have reviewed the patient's current medications.  Results for orders placed or performed during the hospital encounter of 05/13/16 (from the past 48 hour(s))  CBC     Status: Abnormal   Collection Time: 05/22/16  5:15 AM  Result Value Ref Range   WBC 15.5 (H) 4.0 - 10.5 K/uL   RBC 2.91 (L) 3.87 - 5.11 MIL/uL   Hemoglobin 8.6 (L) 12.0 - 15.0 g/dL   HCT 26.5 (L) 36.0 - 46.0 %   MCV 91.1 78.0 - 100.0 fL   MCH 29.6 26.0 - 34.0 pg   MCHC 32.5 30.0 - 36.0 g/dL   RDW 13.2 11.5 - 15.5 %   Platelets 463 (H) 150 - 400 K/uL  Comprehensive metabolic panel     Status: Abnormal   Collection Time: 05/22/16  5:15 AM  Result Value Ref Range   Sodium 136 135 - 145 mmol/L   Potassium 4.0 3.5 - 5.1 mmol/L   Chloride 99 (L) 101 - 111 mmol/L   CO2 28 22 -  32 mmol/L   Glucose, Bld 101 (H) 65 - 99 mg/dL   BUN 7 6 - 20 mg/dL   Creatinine, Ser 0.98 0.44 - 1.00 mg/dL   Calcium 9.1 8.9 - 10.3 mg/dL   Total Protein 7.0 6.5 - 8.1 g/dL   Albumin 2.5 (L) 3.5 - 5.0 g/dL   AST 11 (L) 15 - 41 U/L   ALT 7 (L) 14 - 54 U/L   Alkaline Phosphatase 83 38 - 126 U/L   Total Bilirubin 0.5 0.3 - 1.2 mg/dL   GFR calc non Af Amer >60 >60 mL/min   GFR calc Af Amer >60 >60 mL/min    Comment: (NOTE) The eGFR has been calculated using the CKD EPI equation. This calculation has not been validated in all clinical situations. eGFR's persistently <60 mL/min signify possible Chronic Kidney Disease.    Anion gap 9 5 - 15  CBC     Status: Abnormal   Collection Time: 05/23/16  8:55 AM  Result Value Ref Range    WBC 15.2 (H) 4.0 - 10.5 K/uL   RBC 2.96 (L) 3.87 - 5.11 MIL/uL   Hemoglobin 8.9 (L) 12.0 - 15.0 g/dL   HCT 26.9 (L) 36.0 - 46.0 %   MCV 90.9 78.0 - 100.0 fL   MCH 30.1 26.0 - 34.0 pg   MCHC 33.1 30.0 - 36.0 g/dL   RDW 13.4 11.5 - 15.5 %   Platelets 503 (H) 150 - 400 K/uL    Ct Abdomen Pelvis W Contrast  Result Date: 05/22/2016 CLINICAL DATA:  29 year old female with history of elevated white blood cell count. History of complicated tubo-ovarian abscess, status post partial colectomy and left lower quadrant colostomy. Intra abdominal abscesses with multiple pelvic drains. EXAM: CT ABDOMEN AND PELVIS WITH CONTRAST TECHNIQUE: Multidetector CT imaging of the abdomen and pelvis was performed using the standard protocol following bolus administration of intravenous contrast. CONTRAST:  164m ISOVUE-300 IOPAMIDOL (ISOVUE-300) INJECTION 61% COMPARISON:  CT of the abdomen and pelvis 05/13/2016. FINDINGS: Lower chest: Areas of dependent subsegmental atelectasis are noted in the lung bases bilaterally. Hepatobiliary: No cystic or solid hepatic lesions. No intra or extrahepatic biliary ductal dilatation. Small amount of high attenuation material lying dependently in the gallbladder may reflect biliary sludge and/or tiny gallstones. No findings to suggest an acute cholecystitis at this time. Pancreas: No pancreatic mass. No pancreatic ductal dilatation. No pancreatic or peripancreatic fluid or inflammatory changes. Spleen: Unremarkable. Adrenals/Urinary Tract: Compared to the prior examination there is new moderate left-sided hydronephrosis, with extensive left-sided periureteric soft tissue stranding. This appears related to extrinsic compression from the inflammatory mass in the left adnexal region (discussed below), which appears to compress the left ureter (best appreciated on images 58 63 of series 2). Bilateral kidneys are otherwise normal in appearance, as are the adrenal glands bilaterally. Subjectively,  there is delayed per fusion of the left kidney, however, delayed images were not obtained on today's examination. No right-sided hydroureteronephrosis. Urinary bladder is remarkable for some nondependent gas. No indwelling Foley catheter is noted. Stomach/Bowel: Normal appearance of the stomach. No pathologic dilatation of small bowel or colon. Postoperative changes of partial colectomy with left lower quadrant colostomy are new compared to the prior examination. Suture line noted in the left lower quadrant, with a long Hartmann's pouch distal to it. Status post appendectomy. Vascular/Lymphatic: No significant atherosclerotic disease, aneurysm or dissection identified in the abdominal or pelvic vasculature. No lymphadenopathy noted in the abdomen or pelvis. Reproductive: Uterus and right ovary are  unremarkable in appearance. The left adnexal region is grossly distorted, by what appears to be an inflammatory mass. Accurate discrimination of the ovary within this region is not possible. There is a tubular shaped low-attenuation area which is best appreciated on coronal image 60 of series 603, which demonstrates some peripheral enhancement, concerning for residual tubo-ovarian abscess. This is also appreciated on axial images 64-69 of series 2. Surrounding this there is extensive inflammation which has an appearance suggestive of a phlegmon. This exerts significant mass effect upon the left pelvic sidewall, most notably causing significant compression of the distal third of the left ureter. Other: Surgical drain in the low anatomic pelvis, injuring the right anterior pelvic wall, and extending into the cul-de-sac region, with tip terminating in the lower left hemipelvis anteriorly. Small low-attenuation rim enhancing fluid collection with internal locules of gas immediately deep to the anterior abdominal wall incision in the low anatomic pelvis (axial image 61 of series 2 and sagittal image 82 of series 602). No other  well-defined rim enhancing fluid collections are noted in the abdomen or pelvis. No pneumoperitoneum. Musculoskeletal: Midline anterior abdominal wound appears to be closing via a secondary intention. There are no aggressive appearing lytic or blastic lesions noted in the visualized portions of the skeleton. IMPRESSION: 1. Postoperative changes related to recent partial colectomy and left lower quadrant colostomy, as above. Small low-attenuation rim enhancing gas and fluid collection immediately deep to the healing anterior abdominal wound. This may simply represent a resolving postoperative seroma, however, the possibility of a small abscess in this region is not excluded. No other well-defined intra abdominal abscess is noted. 2. However, there is tubular dilatation of a low-attenuation rim enhancing structure in the left adnexa, which is concerning for persistent left-sided tubo-ovarian abscess this is surrounded by inflammatory mass (likely phlegmon involving the left ovary/adenxal structures), which is exerting significant mass effect upon adjacent structures, including the distal third of the left ureter which is resulting in moderate left-sided hydroureteronephrosis which is new compared to the prior examination. 3. Small amount of gas non dependently in the lumen of the urinary bladder. This is likely iatrogenic if there has been recent indwelling Foley catheter (no Foley catheter is noted on today's examination). In the absence of a history of recent catheterization, correlation with urinalysis would be recommended to exclude urinary tract infection with gas-forming organisms. 4. Additional incidental findings, as above. Electronically Signed   By: Vinnie Langton M.D.   On: 05/22/2016 09:44    Review of Systems  Constitutional: Positive for malaise/fatigue.  HENT: Negative.   Eyes: Negative.   Respiratory: Negative.   Cardiovascular: Negative.   Gastrointestinal: Negative.   Genitourinary:  Positive for flank pain. Negative for dysuria, frequency, hematuria and urgency.  Skin: Negative.   Neurological: Negative.   Endo/Heme/Allergies: Negative.   Psychiatric/Behavioral: Negative.    Blood pressure 118/75, pulse 96, temperature 98 F (36.7 C), temperature source Oral, resp. rate 18, height 5' 2"  (1.575 m), weight 58.1 kg (128 lb), SpO2 95 %. Physical Exam  NAD, AOx3, 3yo daughter and family at [facility] [facility], hirsuite Non-labored respiration on room air Regular hear rate Mild-moderate left CVAT Midline open skin incision. LLQ colsotomy that is pink / patent. RLQ abdominal drain with mostly serosanguinaous fluid. NO c/c/e   Assessment/Plan:  1 - Left Hydronephrosis with Flank Pain - likely extrinsic partial ureteral obstruction from continued pelvic inflammatory process. Discussed management options of observation v. Placement internalized left ureteral stent. Goals of stenting would be hopeful  improvement in flank pain, renal protection while pelvic inflammation resolving. Risks, benefits, alternatives, peri-op course discussed as well as temporary nature of stent with need for removal or replacement at 31mo or less. She will think about it overnight and re discuss in AM. This is certainly reasonable.     Kayd Launer 05/23/2016, 7:14 PM

## 2016-05-23 NOTE — Consult Note (Signed)
WOC Nurse ostomy follow up Stoma type/location: LLQ colostomy Stomal assessment/size: 1 and 1/2 inch round, moist, red, slightly elevated (with a crease at 9 o'clock) Peristomal assessment: intact clear Treatment options for stomal/peristomal skin: skin barrier ring Output small amount brown effluent Ostomy pouching: 2pc. 2 and 1/4 inch pouching system with skin barrier ring Education provided: Mother is able to cut out pouching system, both can perform Lock and roll closure.  Will work on placement of pouching system and skin barrier ring next week.  Patient is in pain today and awaiting Urology consult for left flank discomfort.  Dr. Ezzard StandingNewman in with me during visit. Enrolled patient in [facility] Dis[facility] program: Yes, on 05/20/16 WOC nursing team will follow, and will remain available to this patient, the nursing, surgical and medical teams.  . Thanks, Ladona MowLaurie Jamahl Lemmons, MSN, RN, GNP, Hans EdenCWOCN, CWON-AP, FAAN  Pager# [phone]

## 2016-05-24 MED ORDER — LIDOCAINE-PRILOCAINE 2.5-2.5 % EX CREA
TOPICAL_CREAM | Freq: Once | CUTANEOUS | Status: AC
  Administered 2016-05-24: 18:00:00 via TOPICAL
  Filled 2016-05-24: qty 5

## 2016-05-24 MED ORDER — MEDROXYPROGESTERONE ACETATE 150 MG/ML IM SUSP
150.0000 mg | Freq: Once | INTRAMUSCULAR | Status: AC
  Administered 2016-05-24: 150 mg via INTRAMUSCULAR
  Filled 2016-05-24: qty 1

## 2016-05-24 NOTE — Progress Notes (Signed)
9 Days Post-Op  Subjective:  1 - Left Hydronephrosis with Flank Pain - pt with progressive left hydro over serial imaging since 03/2016 during complex course for tubo-ovarian abscess / colon injury / colostomy. She had externalized temporary stent placed by Dr. Vernie Ammonsttelin 05/15/16 to aide in ureteral identification during segmental colon resection, but that has since been removed.  CT 8/10 with progressive hydro to distal ureter / area of prior inflammation / phlegmon and progressive left CVAT. Ct <1. Most recent UCX negative, though she does have some on / off low grade fevers.  Today "Sheila Willis" is stable. We discussed utility of ureteral stent placement and she wants to proceed with that tomorrow. Flank pain stable. No worsening fevers.     Objective: Vital signs in last 24 hours: Temp:  [98 F (36.7 C)-98.4 F (36.9 C)] 98.4 F (36.9 C) (08/12 0650) Pulse Rate:  [96-106] 97 (08/12 0650) Resp:  [16-20] 16 (08/12 0650) BP: (103-118)/(66-79) 111/70 (08/12 0650) SpO2:  [93 %-100 %] 98 % (08/12 0650) Last BM Date: 05/23/16  Intake/Output from previous day: 08/11 0701 - 08/12 0700 In: 1091.5 [P.O.:120; I.V.:871.5; IV Piggyback:100] Out: 1005 [Urine:1000; Drains:5] Intake/Output this shift: No intake/output data recorded.  General appearance: alert, cooperative, appears stated age and family at bedside Eyes: negative Nose: Nares normal. Septum midline. Mucosa normal. No drainage or sinus tenderness. Throat: lips, mucosa, and tongue normal; teeth and gums normal Neck: supple, symmetrical, trachea midline Back: Mild-moderate, stable left CVAT Cardio: regular rate and rhythm, S1, S2 normal, no murmur, click, rub or gallop GI: LLQ colostomy pink and patent. Midline wound with dry dressing in place wound not direcly inspected, RLQ drain with mostly serosangiunous fluid.  Extremities: extremities normal, atraumatic, no cyanosis or edema Lymph nodes: Cervical, supraclavicular, and axillary nodes  normal. Neurologic: Grossly normal  Lab Results:   Recent Labs  05/22/16 0515 05/23/16 0855  WBC 15.5* 15.2*  HGB 8.6* 8.9*  HCT 26.5* 26.9*  PLT 463* 503*   BMET  Recent Labs  05/22/16 0515  NA 136  K 4.0  CL 99*  CO2 28  GLUCOSE 101*  BUN 7  CREATININE 0.98  CALCIUM 9.1   PT/INR No results for input(s): LABPROT, INR in the last 72 hours. ABG No results for input(s): PHART, HCO3 in the last 72 hours.  Invalid input(s): PCO2, PO2  Studies/Results: Ct Abdomen Pelvis W Contrast  Result Date: 05/22/2016 CLINICAL DATA:  30 year old female with history of elevated white blood cell count. History of complicated tubo-ovarian abscess, status post partial colectomy and left lower quadrant colostomy. Intra abdominal abscesses with multiple pelvic drains. EXAM: CT ABDOMEN AND PELVIS WITH CONTRAST TECHNIQUE: Multidetector CT imaging of the abdomen and pelvis was performed using the standard protocol following bolus administration of intravenous contrast. CONTRAST:  100mL ISOVUE-300 IOPAMIDOL (ISOVUE-300) INJECTION 61% COMPARISON:  CT of the abdomen and pelvis 05/13/2016. FINDINGS: Lower chest: Areas of dependent subsegmental atelectasis are noted in the lung bases bilaterally. Hepatobiliary: No cystic or solid hepatic lesions. No intra or extrahepatic biliary ductal dilatation. Small amount of high attenuation material lying dependently in the gallbladder may reflect biliary sludge and/or tiny gallstones. No findings to suggest an acute cholecystitis at this time. Pancreas: No pancreatic mass. No pancreatic ductal dilatation. No pancreatic or peripancreatic fluid or inflammatory changes. Spleen: Unremarkable. Adrenals/Urinary Tract: Compared to the prior examination there is new moderate left-sided hydronephrosis, with extensive left-sided periureteric soft tissue stranding. This appears related to extrinsic compression from the inflammatory mass in the  left adnexal region (discussed  below), which appears to compress the left ureter (best appreciated on images 58 63 of series 2). Bilateral kidneys are otherwise normal in appearance, as are the adrenal glands bilaterally. Subjectively, there is delayed per fusion of the left kidney, however, delayed images were not obtained on today's examination. No right-sided hydroureteronephrosis. Urinary bladder is remarkable for some nondependent gas. No indwelling Foley catheter is noted. Stomach/Bowel: Normal appearance of the stomach. No pathologic dilatation of small bowel or colon. Postoperative changes of partial colectomy with left lower quadrant colostomy are new compared to the prior examination. Suture line noted in the left lower quadrant, with a long Hartmann's pouch distal to it. Status post appendectomy. Vascular/Lymphatic: No significant atherosclerotic disease, aneurysm or dissection identified in the abdominal or pelvic vasculature. No lymphadenopathy noted in the abdomen or pelvis. Reproductive: Uterus and right ovary are unremarkable in appearance. The left adnexal region is grossly distorted, by what appears to be an inflammatory mass. Accurate discrimination of the ovary within this region is not possible. There is a tubular shaped low-attenuation area which is best appreciated on coronal image 60 of series 603, which demonstrates some peripheral enhancement, concerning for residual tubo-ovarian abscess. This is also appreciated on axial images 64-69 of series 2. Surrounding this there is extensive inflammation which has an appearance suggestive of a phlegmon. This exerts significant mass effect upon the left pelvic sidewall, most notably causing significant compression of the distal third of the left ureter. Other: Surgical drain in the low anatomic pelvis, injuring the right anterior pelvic wall, and extending into the cul-de-sac region, with tip terminating in the lower left hemipelvis anteriorly. Small low-attenuation rim enhancing  fluid collection with internal locules of gas immediately deep to the anterior abdominal wall incision in the low anatomic pelvis (axial image 61 of series 2 and sagittal image 82 of series 602). No other well-defined rim enhancing fluid collections are noted in the abdomen or pelvis. No pneumoperitoneum. Musculoskeletal: Midline anterior abdominal wound appears to be closing via a secondary intention. There are no aggressive appearing lytic or blastic lesions noted in the visualized portions of the skeleton. IMPRESSION: 1. Postoperative changes related to recent partial colectomy and left lower quadrant colostomy, as above. Small low-attenuation rim enhancing gas and fluid collection immediately deep to the healing anterior abdominal wound. This may simply represent a resolving postoperative seroma, however, the possibility of a small abscess in this region is not excluded. No other well-defined intra abdominal abscess is noted. 2. However, there is tubular dilatation of a low-attenuation rim enhancing structure in the left adnexa, which is concerning for persistent left-sided tubo-ovarian abscess this is surrounded by inflammatory mass (likely phlegmon involving the left ovary/adenxal structures), which is exerting significant mass effect upon adjacent structures, including the distal third of the left ureter which is resulting in moderate left-sided hydroureteronephrosis which is new compared to the prior examination. 3. Small amount of gas non dependently in the lumen of the urinary bladder. This is likely iatrogenic if there has been recent indwelling Foley catheter (no Foley catheter is noted on today's examination). In the absence of a history of recent catheterization, correlation with urinalysis would be recommended to exclude urinary tract infection with gas-forming organisms. 4. Additional incidental findings, as above. Electronically Signed   By: Trudie Reed M.D.   On: 05/22/2016 09:44     Anti-infectives: Anti-infectives    Start     Dose/Rate Route Frequency Ordered Stop   05/22/16 2200  cefoTEtan (  CEFOTAN) 2 g in dextrose 5 % 50 mL IVPB     2 g 100 mL/hr over 30 Minutes Intravenous Every 12 hours 05/22/16 1702     05/22/16 1800  doxycycline (VIBRAMYCIN) 100 mg in dextrose 5 % 250 mL IVPB     100 mg 125 mL/hr over 120 Minutes Intravenous Every 12 hours 05/22/16 1702     05/15/16 1600  piperacillin-tazobactam (ZOSYN) IVPB 3.375 g  Status:  Discontinued     3.375 g 12.5 mL/hr over 240 Minutes Intravenous Every 8 hours 05/15/16 1547 05/22/16 1701   05/15/16 0830  cefoTEtan (CEFOTAN) 2 g in dextrose 5 % 50 mL IVPB     2 g 100 mL/hr over 30 Minutes Intravenous On call to O.R. 05/14/16 0920 05/15/16 0945   05/14/16 0930  cefoTEtan (CEFOTAN) 2 g in dextrose 5 % 50 mL IVPB  Status:  Discontinued     2 g 100 mL/hr over 30 Minutes Intravenous On call to O.R. 05/14/16 9147 05/14/16 0921   05/13/16 2200  vancomycin (VANCOCIN) IVPB 750 mg/150 ml premix  Status:  Discontinued     750 mg 150 mL/hr over 60 Minutes Intravenous Every 12 hours 05/13/16 1024 05/13/16 1529   05/13/16 1600  piperacillin-tazobactam (ZOSYN) IVPB 3.375 g  Status:  Discontinued     3.375 g 12.5 mL/hr over 240 Minutes Intravenous Every 8 hours 05/13/16 1024 05/15/16 1547   05/13/16 1015  vancomycin (VANCOCIN) IVPB 1000 mg/200 mL premix     1,000 mg 200 mL/hr over 60 Minutes Intravenous  Once 05/13/16 1006 05/13/16 1233   05/13/16 1015  piperacillin-tazobactam (ZOSYN) IVPB 3.375 g     3.375 g 100 mL/hr over 30 Minutes Intravenous  Once 05/13/16 1006 05/13/16 1104      Assessment/Plan:  1 - Left Hydronephrosis with Flank Pain - proceed with left ureteral stent placement tomorrow. Risks, benefits, alternatives discussed as well as expected peri-op course. NPO p MN. Obtain consent.    LOS: 11 days     Perley Arthurs 05/24/2016

## 2016-05-24 NOTE — Progress Notes (Signed)
9 Days Post-Op  Subjective: Some left flank discomfort.  Objective: Vital signs in last 24 hours: Temp:  [98 F (36.7 C)-98.4 F (36.9 C)] 98.4 F (36.9 C) (08/12 0650) Pulse Rate:  [96-106] 97 (08/12 0650) Resp:  [16-20] 16 (08/12 0650) BP: (103-118)/(66-79) 111/70 (08/12 0650) SpO2:  [93 %-100 %] 98 % (08/12 0650) Last BM Date: 05/23/16  Intake/Output from previous day: 08/11 0701 - 08/12 0700 In: 1091.5 [P.O.:120; I.V.:871.5; IV Piggyback:100] Out: 1005 [Urine:1000; Drains:5] Intake/Output this shift: No intake/output data recorded.  PE: General- In NAD Abdomen-soft, open wound clean with good granulation tissue, colostomy pink with some soft stool output, serous drain output, active bowel sounds  Lab Results:   Recent Labs  05/22/16 0515 05/23/16 0855  WBC 15.5* 15.2*  HGB 8.6* 8.9*  HCT 26.5* 26.9*  PLT 463* 503*   BMET  Recent Labs  05/22/16 0515  NA 136  K 4.0  CL 99*  CO2 28  GLUCOSE 101*  BUN 7  CREATININE 0.98  CALCIUM 9.1   PT/INR No results for input(s): LABPROT, INR in the last 72 hours. Comprehensive Metabolic Panel:    Component Value Date/Time   NA 136 05/22/2016 0515   NA 138 05/17/2016 0500   K 4.0 05/22/2016 0515   K 4.2 05/17/2016 0500   CL 99 (L) 05/22/2016 0515   CL 107 05/17/2016 0500   CO2 28 05/22/2016 0515   CO2 26 05/17/2016 0500   BUN 7 05/22/2016 0515   BUN <5 (L) 05/17/2016 0500   CREATININE 0.98 05/22/2016 0515   CREATININE 1.06 (H) 05/17/2016 0500   GLUCOSE 101 (H) 05/22/2016 0515   GLUCOSE 111 (H) 05/17/2016 0500   CALCIUM 9.1 05/22/2016 0515   CALCIUM 8.3 (L) 05/17/2016 0500   AST 11 (L) 05/22/2016 0515   AST 17 05/13/2016 0953   ALT 7 (L) 05/22/2016 0515   ALT 9 (L) 05/13/2016 0953   ALKPHOS 83 05/22/2016 0515   ALKPHOS 108 05/13/2016 0953   BILITOT 0.5 05/22/2016 0515   BILITOT 0.8 05/13/2016 0953   PROT 7.0 05/22/2016 0515   PROT 8.8 (H) 05/13/2016 0953   ALBUMIN 2.5 (L) 05/22/2016 0515   ALBUMIN  3.5 05/13/2016 0953     Studies/Results: Ct Abdomen Pelvis W Contrast  Result Date: 05/22/2016 CLINICAL DATA:  31 year old female with history of elevated white blood cell count. History of complicated tubo-ovarian abscess, status post partial colectomy and left lower quadrant colostomy. Intra abdominal abscesses with multiple pelvic drains. EXAM: CT ABDOMEN AND PELVIS WITH CONTRAST TECHNIQUE: Multidetector CT imaging of the abdomen and pelvis was performed using the standard protocol following bolus administration of intravenous contrast. CONTRAST:  ISOVUE-300 IOPAMIDOL (ISOVUE-300) INJECTION 61% COMPARISON:  CT of the abdomen and pelvis 05/13/2016. FINDINGS: Lower chest: Areas of dependent subsegmental atelectasis are noted in the lung bases bilaterally. Hepatobiliary: No cystic or solid hepatic lesions. No intra or extrahepatic biliary ductal dilatation. Small amount of high attenuation material lying dependently in the gallbladder may reflect biliary sludge and/or tiny gallstones. No findings to suggest an acute cholecystitis at this time. Pancreas: No pancreatic mass. No pancreatic ductal dilatation. No pancreatic or peripancreatic fluid or inflammatory changes. Spleen: Unremarkable. Adrenals/Urinary Tract: Compared to the prior examination there is new moderate left-sided hydronephrosis, with extensive left-sided periureteric soft tissue stranding. This appears related to extrinsic compression from the inflammatory mass in the left adnexal region (discussed below), which appears to compress the left ureter (best appreciated on images 58 63 of series  2). Bilateral kidneys are otherwise normal in appearance, as are the adrenal glands bilaterally. Subjectively, there is delayed per fusion of the left kidney, however, delayed images were not obtained on today's examination. No right-sided hydroureteronephrosis. Urinary bladder is remarkable for some nondependent gas. No indwelling Foley catheter is  noted. Stomach/Bowel: Normal appearance of the stomach. No pathologic dilatation of small bowel or colon. Postoperative changes of partial colectomy with left lower quadrant colostomy are new compared to the prior examination. Suture line noted in the left lower quadrant, with a long Hartmann's pouch distal to it. Status post appendectomy. Vascular/Lymphatic: No significant atherosclerotic disease, aneurysm or dissection identified in the abdominal or pelvic vasculature. No lymphadenopathy noted in the abdomen or pelvis. Reproductive: Uterus and right ovary are unremarkable in appearance. The left adnexal region is grossly distorted, by what appears to be an inflammatory mass. Accurate discrimination of the ovary within this region is not possible. There is a tubular shaped low-attenuation area which is best appreciated on coronal image 60 of series 603, which demonstrates some peripheral enhancement, concerning for residual tubo-ovarian abscess. This is also appreciated on axial images 64-69 of series 2. Surrounding this there is extensive inflammation which has an appearance suggestive of a phlegmon. This exerts significant mass effect upon the left pelvic sidewall, most notably causing significant compression of the distal third of the left ureter. Other: Surgical drain in the low anatomic pelvis, injuring the right anterior pelvic wall, and extending into the cul-de-sac region, with tip terminating in the lower left hemipelvis anteriorly. Small low-attenuation rim enhancing fluid collection with internal locules of gas immediately deep to the anterior abdominal wall incision in the low anatomic pelvis (axial image 61 of series 2 and sagittal image 82 of series 602). No other well-defined rim enhancing fluid collections are noted in the abdomen or pelvis. No pneumoperitoneum. Musculoskeletal: Midline anterior abdominal wound appears to be closing via a secondary intention. There are no aggressive appearing lytic  or blastic lesions noted in the visualized portions of the skeleton. IMPRESSION: 1. Postoperative changes related to recent partial colectomy and left lower quadrant colostomy, as above. Small low-attenuation rim enhancing gas and fluid collection immediately deep to the healing anterior abdominal wound. This may simply represent a resolving postoperative seroma, however, the possibility of a small abscess in this region is not excluded. No other well-defined intra abdominal abscess is noted. 2. However, there is tubular dilatation of a low-attenuation rim enhancing structure in the left adnexa, which is concerning for persistent left-sided tubo-ovarian abscess this is surrounded by inflammatory mass (likely phlegmon involving the left ovary/adenxal structures), which is exerting significant mass effect upon adjacent structures, including the distal third of the left ureter which is resulting in moderate left-sided hydroureteronephrosis which is new compared to the prior examination. 3. Small amount of gas non dependently in the lumen of the urinary bladder. This is likely iatrogenic if there has been recent indwelling Foley catheter (no Foley catheter is noted on today's examination). In the absence of a history of recent catheterization, correlation with urinalysis would be recommended to exclude urinary tract infection with gas-forming organisms. 4. Additional incidental findings, as above. Electronically Signed   By: Trudie Reedaniel  Entrikin M.D.   On: 05/22/2016 09:44    Anti-infectives: Anti-infectives    Start     Dose/Rate Route Frequency Ordered Stop   05/22/16 2200  cefoTEtan (CEFOTAN) 2 g in dextrose 5 % 50 mL IVPB     2 g 100 mL/hr over 30 Minutes  Intravenous Every 12 hours 05/22/16 1702     05/22/16 1800  doxycycline (VIBRAMYCIN) 100 mg in dextrose 5 % 250 mL IVPB     100 mg 125 mL/hr over 120 Minutes Intravenous Every 12 hours 05/22/16 1702     05/15/16 1600  piperacillin-tazobactam (ZOSYN) IVPB  3.375 g  Status:  Discontinued     3.375 g 12.5 mL/hr over 240 Minutes Intravenous Every 8 hours 05/15/16 1547 05/22/16 1701   05/15/16 0830  cefoTEtan (CEFOTAN) 2 g in dextrose 5 % 50 mL IVPB     2 g 100 mL/hr over 30 Minutes Intravenous On call to O.R. 05/14/16 0920 05/15/16 0945   05/14/16 0930  cefoTEtan (CEFOTAN) 2 g in dextrose 5 % 50 mL IVPB  Status:  Discontinued     2 g 100 mL/hr over 30 Minutes Intravenous On call to O.R. 05/14/16 1610 05/14/16 0921   05/13/16 2200  vancomycin (VANCOCIN) IVPB 750 mg/150 ml premix  Status:  Discontinued     750 mg 150 mL/hr over 60 Minutes Intravenous Every 12 hours 05/13/16 1024 05/13/16 1529   05/13/16 1600  piperacillin-tazobactam (ZOSYN) IVPB 3.375 g  Status:  Discontinued     3.375 g 12.5 mL/hr over 240 Minutes Intravenous Every 8 hours 05/13/16 1024 05/15/16 1547   05/13/16 1015  vancomycin (VANCOCIN) IVPB 1000 mg/200 mL premix     1,000 mg 200 mL/hr over 60 Minutes Intravenous  Once 05/13/16 1006 05/13/16 1233   05/13/16 1015  piperacillin-tazobactam (ZOSYN) IVPB 3.375 g     3.375 g 100 mL/hr over 30 Minutes Intravenous  Once 05/13/16 1006 05/13/16 1104      Assessment 1.S/p expl lap, drainage of pelvic abscesses, partial colectomy, 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  No way to identify tubular structure intraop which is bounded by and densely adherent to left internal iliac vessels and left ureter.  CT reviewed.  2.  Anemia-on EPO  3.  ID-now on Cefotan and doxycyline per ID recs.  Plan:  I strongly encouraged her to allow Dr. Berneice Heinrich to do cystoscopy and stent placement today. Continue abxs as left pelvic inflammatory/infectious processes is not approachable with surgery.    LOS: 11 days   Plan:    Adolph Pollack 05/24/2016

## 2016-05-24 NOTE — Progress Notes (Signed)
Pt continues to turn OFF end tidal CO2 and RR reader and taking her oxygen off even after locking the pump, she still managed to turn it off. She states she is doing this because she cannot sleep with the beeping. I educated pt about importance of monitoring these levels.

## 2016-05-24 NOTE — Progress Notes (Signed)
Pt tachy in low 100's at rest most of shift with increase of HR with activity/ambulation as high as 140's. Higher HR resolves with rest. Asymptomatic. Other VSS. No chest pain nor shortness of breath noted with activity. Next shift RN made aware.

## 2016-05-24 NOTE — Plan of Care (Signed)
Problem: Activity: Goal: Risk for activity intolerance will decrease Outcome: Progressing Pt ambulating short distances yet frequently this shift.

## 2016-05-25 ENCOUNTER — Inpatient Hospital Stay (HOSPITAL_COMMUNITY): Payer: Commercial Managed Care - PPO

## 2016-05-25 ENCOUNTER — Encounter (HOSPITAL_COMMUNITY): Payer: Self-pay | Admitting: Anesthesiology

## 2016-05-25 ENCOUNTER — Encounter (HOSPITAL_COMMUNITY): Admission: EM | Disposition: A | Discharge status: Home Health | Payer: Self-pay | Source: Home / Self Care

## 2016-05-25 ENCOUNTER — Inpatient Hospital Stay (HOSPITAL_COMMUNITY): Payer: Commercial Managed Care - PPO | Admitting: Anesthesiology

## 2016-05-25 HISTORY — PX: CYSTOSCOPY W/ URETERAL STENT PLACEMENT: SHX1429

## 2016-05-25 LAB — SURGICAL PCR SCREEN
MRSA, PCR: NEGATIVE
Staphylococcus aureus: NEGATIVE

## 2016-05-25 SURGERY — CYSTOSCOPY, WITH RETROGRADE PYELOGRAM AND URETERAL STENT INSERTION
Anesthesia: General | Site: Ureter | Laterality: Left

## 2016-05-25 MED ORDER — PROPOFOL 10 MG/ML IV BOLUS
INTRAVENOUS | Status: AC
  Filled 2016-05-25: qty 20

## 2016-05-25 MED ORDER — 0.9 % SODIUM CHLORIDE (POUR BTL) OPTIME
TOPICAL | Status: DC | PRN
  Administered 2016-05-25: 1000 mL

## 2016-05-25 MED ORDER — SODIUM CHLORIDE 0.9 % IR SOLN
Status: DC | PRN
  Administered 2016-05-25: 3000 mL

## 2016-05-25 MED ORDER — ONDANSETRON HCL 4 MG/2ML IJ SOLN
INTRAMUSCULAR | Status: DC | PRN
  Administered 2016-05-25: 4 mg via INTRAVENOUS

## 2016-05-25 MED ORDER — NALOXONE HCL 0.4 MG/ML IJ SOLN: 0.4000 mg | INTRAMUSCULAR | Status: DC | PRN

## 2016-05-25 MED ORDER — HYDROMORPHONE HCL 1 MG/ML IJ SOLN
INTRAMUSCULAR | Status: AC
  Filled 2016-05-25: qty 1

## 2016-05-25 MED ORDER — LIDOCAINE 2% (20 MG/ML) 5 ML SYRINGE
INTRAMUSCULAR | Status: DC | PRN
  Administered 2016-05-25: 100 mg via INTRAVENOUS

## 2016-05-25 MED ORDER — DEXAMETHASONE SODIUM PHOSPHATE 10 MG/ML IJ SOLN
INTRAMUSCULAR | Status: AC
  Filled 2016-05-25: qty 1

## 2016-05-25 MED ORDER — MIDAZOLAM HCL 5 MG/5ML IJ SOLN
INTRAMUSCULAR | Status: DC | PRN
  Administered 2016-05-25: 1 mg via INTRAVENOUS

## 2016-05-25 MED ORDER — DIPHENHYDRAMINE HCL 12.5 MG/5ML PO ELIX
12.5000 mg | ORAL_SOLUTION | Freq: Four times a day (QID) | ORAL | Status: DC | PRN
  Filled 2016-05-25: qty 5

## 2016-05-25 MED ORDER — DEXAMETHASONE SODIUM PHOSPHATE 10 MG/ML IJ SOLN
INTRAMUSCULAR | Status: DC | PRN
  Administered 2016-05-25: 5 mg via INTRAVENOUS

## 2016-05-25 MED ORDER — DIPHENHYDRAMINE HCL 50 MG/ML IJ SOLN
12.5000 mg | Freq: Four times a day (QID) | INTRAMUSCULAR | Status: DC | PRN
Start: 2016-05-25 — End: 2016-06-04

## 2016-05-25 MED ORDER — ONDANSETRON HCL 4 MG/2ML IJ SOLN
INTRAMUSCULAR | Status: AC
  Filled 2016-05-25: qty 2

## 2016-05-25 MED ORDER — MIDAZOLAM HCL 2 MG/2ML IJ SOLN
INTRAMUSCULAR | Status: AC
  Filled 2016-05-25: qty 2

## 2016-05-25 MED ORDER — SODIUM CHLORIDE 0.9% FLUSH: 9.0000 mL | INTRAVENOUS | Status: DC | PRN

## 2016-05-25 MED ORDER — FENTANYL CITRATE (PF) 100 MCG/2ML IJ SOLN
INTRAMUSCULAR | Status: DC | PRN
  Administered 2016-05-25: 50 ug via INTRAVENOUS

## 2016-05-25 MED ORDER — LACTATED RINGERS IV SOLN: INTRAVENOUS | Status: DC

## 2016-05-25 MED ORDER — LIDOCAINE HCL (CARDIAC) 20 MG/ML IV SOLN
INTRAVENOUS | Status: AC
  Filled 2016-05-25: qty 5

## 2016-05-25 MED ORDER — SODIUM CHLORIDE 0.9 % IV SOLN
INTRAVENOUS | Status: DC | PRN
  Administered 2016-05-25: 07:00:00 via INTRAVENOUS

## 2016-05-25 MED ORDER — IOHEXOL 300 MG/ML  SOLN
INTRAMUSCULAR | Status: DC | PRN
  Administered 2016-05-25: 10 mL via INTRAVENOUS

## 2016-05-25 MED ORDER — ONDANSETRON HCL 4 MG/2ML IJ SOLN: 4.0000 mg | Freq: Four times a day (QID) | INTRAMUSCULAR | Status: DC | PRN

## 2016-05-25 MED ORDER — HYDROMORPHONE HCL 1 MG/ML IJ SOLN
0.2500 mg | INTRAMUSCULAR | Status: DC | PRN
  Administered 2016-05-25 (×2): 0.5 mg via INTRAVENOUS

## 2016-05-25 MED ORDER — PROPOFOL 10 MG/ML IV BOLUS
INTRAVENOUS | Status: DC | PRN
  Administered 2016-05-25: 120 mg via INTRAVENOUS

## 2016-05-25 MED ORDER — FENTANYL CITRATE (PF) 100 MCG/2ML IJ SOLN
INTRAMUSCULAR | Status: AC
  Filled 2016-05-25: qty 2

## 2016-05-25 SURGICAL SUPPLY — 13 items
BAG URO CATCHER STRL LF (MISCELLANEOUS) ×3 IMPLANT
BASKET ZERO TIP NITINOL 2.4FR (BASKET) IMPLANT
CATH INTERMIT  6FR 70CM (CATHETERS) ×3 IMPLANT
CLOTH BEACON ORANGE TIMEOUT ST (SAFETY) ×3 IMPLANT
GLOVE BIOGEL M STRL SZ7.5 (GLOVE) ×3 IMPLANT
GOWN STRL REUS W/TWL LRG LVL3 (GOWN DISPOSABLE) ×6 IMPLANT
GUIDEWIRE ANG ZIPWIRE 038X150 (WIRE) IMPLANT
GUIDEWIRE STR DUAL SENSOR (WIRE) ×3 IMPLANT
MANIFOLD NEPTUNE II (INSTRUMENTS) ×3 IMPLANT
PACK CYSTO (CUSTOM PROCEDURE TRAY) ×3 IMPLANT
STENT POLARIS 5FRX22 (STENTS) ×3 IMPLANT
TUBING CONNECTING 10 (TUBING) ×2 IMPLANT
TUBING CONNECTING 10' (TUBING) ×1

## 2016-05-25 NOTE — Progress Notes (Signed)
Pt and mother in good spirits talking and laughing. No complaints or concerns at this time.

## 2016-05-25 NOTE — Anesthesia Preprocedure Evaluation (Addendum)
Anesthesia Evaluation  Patient identified by MRN, date of birth, ID band Patient awake    Reviewed: Allergy & Precautions, H&P , NPO status , Patient's Chart, lab work & pertinent test results  Airway Mallampati: III  TM Distance: >3 FB Neck ROM: Full    Dental no notable dental hx. (+) Teeth Intact, Dental Advisory Given   Pulmonary neg pulmonary ROS, former smoker,    Pulmonary exam normal breath sounds clear to auscultation       Cardiovascular negative cardio ROS   Rhythm:Regular Rate:Normal     Neuro/Psych  Headaches, Depression negative neurological ROS     GI/Hepatic negative GI ROS, Neg liver ROS,   Endo/Other  negative endocrine ROSdiabetes  Renal/GU negative Renal ROS  negative genitourinary   Musculoskeletal   Abdominal   Peds  Hematology negative hematology ROS (+)   Anesthesia Other Findings   Reproductive/Obstetrics negative OB ROS                            Anesthesia Physical Anesthesia Plan  ASA: II  Anesthesia Plan: General   Post-op Pain Management:    Induction: Intravenous  Airway Management Planned: LMA  Additional Equipment:   Intra-op Plan:   Post-operative Plan: Extubation in OR  Informed Consent: I have reviewed the patients History and Physical, chart, labs and discussed the procedure including the risks, benefits and alternatives for the proposed anesthesia with the patient or authorized representative who has indicated his/her understanding and acceptance.   Dental advisory given  Plan Discussed with: CRNA  Anesthesia Plan Comments:         Anesthesia Quick Evaluation

## 2016-05-25 NOTE — Op Note (Signed)
Sheila Willis:  Newburn, Lilybeth               ACCOUNT NO.:  [id number]  MEDICAL RECORD NO.:  [id number]  LOCATION:  [facility]                         [facility]  PHYSICIAN:  Sebastian Acheheodore Oral Remache, MD     DATE OF BIRTH:  [birth year]  DATE OF PROCEDURE: 05/25/2016                               OPERATIVE REPORT   DIAGNOSIS:  Left hydronephrosis with flank pain.  Complex pelvic abscess and phlegmon.  PROCEDURE: 1. Cystoscopy with left retrograde pyelogram and interpretation. 2. Insertion of left ureteral stent, 5 x 22 Polaris, no tether.  ESTIMATED BLOOD LOSS:  Nil.  COMPLICATIONS:  None.  SPECIMEN:  None.  FINDINGS:  Mild hydroureteronephrosis relative narrowing in the distal ureter consistent with known extrinsic compression from pelvic inflammatory process.  INDICATION:  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  She has also known left hydronephrosis which has been fairly mild likely due to some partial obstruction from extrinsic compression of the inflammatory process in her pelvis.  Unfortunately, she has developed worsening left flank pain. This being her predominant complaint over the last few days.  Repeat imaging with some progressive hydronephrosis with extrinsic compression of the ureter due to her pelvic inflammation.  Options were discussed including continued observation versus stenting with goal of renal decompression, and she wished to proceed with the latter.  Informed consent was obtained and placed in the medical record.  PROCEDURE IN DETAIL:  The patient being [name] and procedure being left ureteral stent placement confirmed.  Procedure was carried out.  Time-out was performed.  Intravenous antibiotics were administered.  General LMA anesthesia introduced.  The patient placed into a low lithotomy position.  Sterile field was created by  prepping and draping the patient's vagina, introitus, and proximal thighs using iodine x3.  Next, cystourethroscopy was performed using a 21-French rigid cystoscope with offset lens.  Inspection of bladder revealed no diverticula, calcifications, or papular lesions.  Ureteral orifices appeared singleton.  The left ureteral orifice was cannulated with a 6- French end-hole catheter and left retrograde pyelogram was obtained.  Left retrograde pyelogram demonstrated a single left ureter, single system, left kidney.  There was mild hydroureteronephrosis to relative narrowing in the distal fifth of the ureter consistent with likely extrinsic compression from known pelvic inflammatory process.  A 0.038 ZIPwire was advanced to the upper pole, over which, a new 5 x 22 Polaris- type stent was placed.  The stent was deployed, noted proximally in the renal pelvis and distally in the urinary bladder.  It appeared to be good size.  Bladder was emptied per cystoscope.  Procedure was then terminated.  The patient tolerated the procedure well.  There were no immediate periprocedural complications.  The patient was taken to the postanesthesia care unit in stable condition.          ______________________________ Sebastian Acheheodore Demerius Podolak, MD     TM/MEDQ  D:  05/25/2016  T:  05/25/2016  Job:  [id number]

## 2016-05-25 NOTE — Progress Notes (Addendum)
Late entry:  Pt requested Depo Provera injection earlier in shift. Pt stated " My OB/GYN doctor wanted me to have it after surgery." Med order found in EPIC for 8/10 yet pt refused when initially offered. Dr Ezzard StandingNewman aware via phone of pt request yet asked RN to notify Dr. Ernestina PennaFogleman for clarification of appropriateness. Dr. Juliene PinaMody paged and made aware at this time about injection. Clarification order received to go ahead and give med as ordered by Dr. Ernestina PennaFogleman. EMLA cream order also received to apply to injection site prior to giving as requested by pt.

## 2016-05-25 NOTE — Anesthesia Postprocedure Evaluation (Signed)
Anesthesia Post Note  Patient: Sheila Willis  Procedure(s) Performed: Procedure(s) (LRB): CYSTOSCOPY WITH RETROGRADE PYELOGRAM/LEFT URETERAL STENT PLACEMENT (Left)  Patient location during evaluation: PACU Anesthesia Type: General Level of consciousness: awake and alert Pain management: pain level controlled Vital Signs Assessment: post-procedure vital signs reviewed and stable Respiratory status: spontaneous breathing, nonlabored ventilation, respiratory function stable and patient connected to nasal cannula oxygen Cardiovascular status: blood pressure returned to baseline and stable Postop Assessment: no signs of nausea or vomiting Anesthetic complications: no    Last Vitals:  Vitals:   05/25/16 0923 05/25/16 0925  BP:    Pulse: (!) 103   Resp: 13 12  Temp: 37.1 C     Last Pain:  Vitals:   05/25/16 0923  TempSrc:   PainSc: 8                  Theus Espin,W. EDMOND

## 2016-05-25 NOTE — Progress Notes (Signed)
I wasted 520 mcg Fentanyl PCA with Darrol Pokeindy Beverly.

## 2016-05-25 NOTE — Progress Notes (Signed)
Day of Surgery  Subjective:  1 - Left Hydronephrosis with Flank Pain - pt with progressive left hydro over serial imaging since 03/2016 during complex course for tubo-ovarian abscess / colon injury / colostomy. She had externalized temporary stent placed by Dr. Vernie Ammons 05/15/16 to aide in ureteral identification during segmental colon resection, but that has since been removed.  CT 8/10 with progressive hydro to distal ureter / area of prior inflammation / phlegmon and progressive left CVAT. Ct <1. Most recent UCX negative, though she does have some on / off low grade fevers.  Today "Sheila Willis" is seen to proceed with left ureteral stent placement. She continues to have on/off fevers and flank pain.   Objective: Vital signs in last 24 hours: Temp:  [98.1 F (36.7 C)-99.1 F (37.3 C)] 98.1 F (36.7 C) (08/13 0604) Pulse Rate:  [73-122] 107 (08/13 0604) Resp:  [15-17] 16 (08/13 0604) BP: (105-116)/(72-89) 116/89 (08/13 0604) SpO2:  [95 %-100 %] 100 % (08/13 0604) Last BM Date: 05/25/16  Intake/Output from previous day: 08/12 0701 - 08/13 0700 In: 1310 [P.O.:360; I.V.:600; IV Piggyback:350] Out: 43 [Drains:10; Stool:33] Intake/Output this shift: No intake/output data recorded.  General appearance: alert, cooperative, appears stated age and mother at bedside Eyes: negative Nose: Nares normal. Septum midline. Mucosa normal. No drainage or sinus tenderness. Throat: lips, mucosa, and tongue normal; teeth and gums normal Neck: supple, symmetrical, trachea midline and hirsuite Back: mild left CVAT Resp: non-labored on room air Cardio: Nl rate Extremities: extremities normal, atraumatic, no cyanosis or edema Pulses: 2+ and symmetric Skin: Skin color, texture, turgor normal. No rashes or lesions Lymph nodes: Cervical, supraclavicular, and axillary nodes normal. Neurologic: Grossly normal  LLQ colostomy pink / patent. RLQ drian with stable serous fluid. Midline wound with gauze dressing over  packing.   Lab Results:   Recent Labs  05/23/16 0855  WBC 15.2*  HGB 8.9*  HCT 26.9*  PLT 503*   BMET No results for input(s): NA, K, CL, CO2, GLUCOSE, BUN, CREATININE, CALCIUM in the last 72 hours. PT/INR No results for input(s): LABPROT, INR in the last 72 hours. ABG No results for input(s): PHART, HCO3 in the last 72 hours.  Invalid input(s): PCO2, PO2  Studies/Results: No results found.  Anti-infectives: Anti-infectives    Start     Dose/Rate Route Frequency Ordered Stop   05/22/16 2200  cefoTEtan (CEFOTAN) 2 g in dextrose 5 % 50 mL IVPB     2 g 100 mL/hr over 30 Minutes Intravenous Every 12 hours 05/22/16 1702     05/22/16 1800  doxycycline (VIBRAMYCIN) 100 mg in dextrose 5 % 250 mL IVPB     100 mg 125 mL/hr over 120 Minutes Intravenous Every 12 hours 05/22/16 1702     05/15/16 1600  piperacillin-tazobactam (ZOSYN) IVPB 3.375 g  Status:  Discontinued     3.375 g 12.5 mL/hr over 240 Minutes Intravenous Every 8 hours 05/15/16 1547 05/22/16 1701   05/15/16 0830  cefoTEtan (CEFOTAN) 2 g in dextrose 5 % 50 mL IVPB     2 g 100 mL/hr over 30 Minutes Intravenous On call to O.R. 05/14/16 0920 05/15/16 0945   05/14/16 0930  cefoTEtan (CEFOTAN) 2 g in dextrose 5 % 50 mL IVPB  Status:  Discontinued     2 g 100 mL/hr over 30 Minutes Intravenous On call to O.R. 05/14/16 1610 05/14/16 0921   05/13/16 2200  vancomycin (VANCOCIN) IVPB 750 mg/150 ml premix  Status:  Discontinued  750 mg 150 mL/hr over 60 Minutes Intravenous Every 12 hours 05/13/16 1024 05/13/16 1529   05/13/16 1600  piperacillin-tazobactam (ZOSYN) IVPB 3.375 g  Status:  Discontinued     3.375 g 12.5 mL/hr over 240 Minutes Intravenous Every 8 hours 05/13/16 1024 05/15/16 1547   05/13/16 1015  vancomycin (VANCOCIN) IVPB 1000 mg/200 mL premix     1,000 mg 200 mL/hr over 60 Minutes Intravenous  Once 05/13/16 1006 05/13/16 1233   05/13/16 1015  piperacillin-tazobactam (ZOSYN) IVPB 3.375 g     3.375 g 100 mL/hr  over 30 Minutes Intravenous  Once 05/13/16 1006 05/13/16 1104      Assessment/Plan:   1 - Left Hydronephrosis with Flank Pain - proceed with left ureteral stent placement today. Risks, benefits, alternatives re-discussed as well as expected peri-op course.   [facility], Sheila Ehresman 05/25/2016

## 2016-05-25 NOTE — Progress Notes (Signed)
Assisted pt with emptying colostomy. Pt took over and independently finished emptying, cleaning and closing of ostomy.

## 2016-05-25 NOTE — Progress Notes (Signed)
Wasted 520 mcg Fentanyl PCA.

## 2016-05-25 NOTE — Progress Notes (Signed)
I verify I witnessed fentanyl 520mcg by Erline Levineianne Widener. Linward HeadlandBeverly, Diandra Cimini D

## 2016-05-25 NOTE — Progress Notes (Signed)
After visit from SW this afternoon, pt became upset and shared her frustration with nurse about a visit from Dr. Phillips OdorGolding last week. Pt reported that Dr. Phillips OdorGolding came in room and ask her mother a lot of sensitive questions while pt slept. Pt felt left out of decision process when medication changes were made by MD because she didn't speak directly with pt regarding plan. Tearful. Reassured pt verbally and via touch. Responded well. Encouraged pt to discuss concerns with physician mentioned. Offered opportunity to speak to Water quality scientistunit director about concerns. Charge RN and Clista BernhardtJoe Perez, [facility], aware of voiced concerns.

## 2016-05-25 NOTE — Brief Op Note (Signed)
05/13/2016 - 05/25/2016  7:59 AM  PATIENT:  Sheila Willis  30 y.o. female  PRE-OPERATIVE DIAGNOSIS:  left ureteral obstruction  POST-OPERATIVE DIAGNOSIS:  left ureteral obstruction  PROCEDURE:  Procedure(s): CYSTOSCOPY WITH RETROGRADE PYELOGRAM/LEFT URETERAL STENT PLACEMENT (Left)  SURGEON:  Surgeon(s) and Role:    * Sebastian Acheheodore Derian Dimalanta, MD - Primary  PHYSICIAN ASSISTANT:   ASSISTANTS: none   ANESTHESIA:   general  EBL:  No intake/output data recorded.  BLOOD ADMINISTERED:none  DRAINS: none   LOCAL MEDICATIONS USED:  NONE  SPECIMEN:  No Specimen  DISPOSITION OF SPECIMEN:  N/A  COUNTS:  YES  TOURNIQUET:  * No tourniquets in log *  DICTATION: .Other Dictation: Dictation Number [id number]  PLAN OF CARE: Admit to inpatient   PATIENT DISPOSITION:  PACU - hemodynamically stable.   Delay start of Pharmacological VTE agent (>24hrs) due to surgical blood loss or risk of bleeding: not applicable

## 2016-05-25 NOTE — Anesthesia Procedure Notes (Signed)
Procedure Name: LMA Insertion Date/Time: 05/25/2016 7:35 AM Performed by: Leroy LibmanEARDON, Sheila Sinning L Patient Re-evaluated:Patient Re-evaluated prior to inductionOxygen Delivery Method: Circle system utilized Preoxygenation: Pre-oxygenation with 100% oxygen Intubation Type: IV induction Ventilation: Mask ventilation without difficulty LMA: LMA inserted LMA Size: 3.0 Number of attempts: 1 Placement Confirmation: positive ETCO2 and breath sounds checked- equal and bilateral Tube secured with: Tape Dental Injury: Teeth and Oropharynx as per pre-operative assessment
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
## 2016-05-25 NOTE — Transfer of Care (Signed)
Immediate Anesthesia Transfer of Care Note  Patient: Sheila Willis  Procedure(s) Performed: Procedure(s): CYSTOSCOPY WITH RETROGRADE PYELOGRAM/LEFT URETERAL STENT PLACEMENT (Left)  Patient Location: PACU  Anesthesia Type:General  Level of Consciousness: sedated  Airway & Oxygen Therapy: Patient Spontanous Breathing and Patient connected to face mask oxygen  Post-op Assessment: Report given to RN and Post -op Vital signs reviewed and stable  Post vital signs: Reviewed and stable  Last Vitals:  Vitals:   05/25/16 0400 05/25/16 0604  BP:  116/89  Pulse:  (!) 107  Resp: 16 16  Temp:  36.7 C    Last Pain:  Vitals:   05/25/16 0604  TempSrc: Oral  PainSc:       Patients Stated Pain Goal: 4 (05/24/16 1700)  Complications: No apparent anesthesia complications

## 2016-05-26 ENCOUNTER — Encounter (HOSPITAL_COMMUNITY): Payer: Self-pay | Admitting: Urology

## 2016-05-26 DIAGNOSIS — G8918 Other acute postprocedural pain: Secondary | ICD-10-CM

## 2016-05-26 DIAGNOSIS — B9620 Unspecified Escherichia coli [E. coli] as the cause of diseases classified elsewhere: Secondary | ICD-10-CM

## 2016-05-26 DIAGNOSIS — N7003 Acute salpingitis and oophoritis: Secondary | ICD-10-CM

## 2016-05-26 LAB — CBC
HCT: 26.7 % — ABNORMAL LOW (ref 36.0–46.0)
HEMOGLOBIN: 8.6 g/dL — AB (ref 12.0–15.0)
MCH: 29.5 pg (ref 26.0–34.0)
MCHC: 32.2 g/dL (ref 30.0–36.0)
MCV: 91.4 fL (ref 78.0–100.0)
Platelets: 592 10*3/uL — ABNORMAL HIGH (ref 150–400)
RBC: 2.92 MIL/uL — ABNORMAL LOW (ref 3.87–5.11)
RDW: 13.9 % (ref 11.5–15.5)
WBC: 14.2 10*3/uL — ABNORMAL HIGH (ref 4.0–10.5)

## 2016-05-26 LAB — URINALYSIS, ROUTINE W REFLEX MICROSCOPIC
Bilirubin Urine: NEGATIVE
Glucose, UA: NEGATIVE mg/dL
Ketones, ur: NEGATIVE mg/dL
NITRITE: NEGATIVE
PROTEIN: 30 mg/dL — AB
SPECIFIC GRAVITY, URINE: 1.016 (ref 1.005–1.030)
pH: 6.5 (ref 5.0–8.0)

## 2016-05-26 LAB — HEPATIC FUNCTION PANEL
ALBUMIN: 2.4 g/dL — AB (ref 3.5–5.0)
ALK PHOS: 98 U/L (ref 38–126)
ALT: 6 U/L — AB (ref 14–54)
AST: 14 U/L — ABNORMAL LOW (ref 15–41)
Bilirubin, Direct: <0.1 mg/dL — ABNORMAL LOW (ref 0.1–0.5)
TOTAL PROTEIN: 7.6 g/dL (ref 6.5–8.1)
Total Bilirubin: 0.3 mg/dL (ref 0.3–1.2)

## 2016-05-26 LAB — URINE MICROSCOPIC-ADD ON

## 2016-05-26 LAB — BASIC METABOLIC PANEL
ANION GAP: 7 (ref 5–15)
BUN: 13 mg/dL (ref 6–20)
CHLORIDE: 102 mmol/L (ref 101–111)
CO2: 28 mmol/L (ref 22–32)
Calcium: 9.4 mg/dL (ref 8.9–10.3)
Creatinine, Ser: 0.7 mg/dL (ref 0.44–1.00)
Glucose, Bld: 99 mg/dL (ref 65–99)
POTASSIUM: 3.8 mmol/L (ref 3.5–5.1)
SODIUM: 137 mmol/L (ref 135–145)

## 2016-05-26 LAB — PREALBUMIN: Prealbumin: 12.8 mg/dL — ABNORMAL LOW (ref 18–38)

## 2016-05-26 MED ORDER — URELLE 81 MG PO TABS
1.0000 | ORAL_TABLET | Freq: Four times a day (QID) | ORAL | Status: DC
  Administered 2016-05-26 – 2016-05-27 (×5): 81 mg via ORAL
  Administered 2016-05-27: 1 via ORAL
  Administered 2016-05-28 – 2016-06-05 (×33): 81 mg via ORAL
  Filled 2016-05-26 (×43): qty 1

## 2016-05-26 MED ORDER — BISACODYL 5 MG PO TBEC
5.0000 mg | DELAYED_RELEASE_TABLET | Freq: Every day | ORAL | Status: DC
  Administered 2016-05-27 – 2016-05-30 (×4): 5 mg via ORAL
  Filled 2016-05-26 (×5): qty 1

## 2016-05-26 MED ORDER — DULOXETINE HCL 30 MG PO CPEP
30.0000 mg | ORAL_CAPSULE | Freq: Every day | ORAL | Status: DC
  Administered 2016-05-26 – 2016-05-27 (×2): 30 mg via ORAL
  Filled 2016-05-26 (×3): qty 1

## 2016-05-26 MED ORDER — MIRABEGRON ER 50 MG PO TB24
50.0000 mg | ORAL_TABLET | Freq: Every day | ORAL | Status: DC
  Administered 2016-05-26 – 2016-06-05 (×10): 50 mg via ORAL
  Filled 2016-05-26 (×11): qty 1

## 2016-05-26 MED ORDER — HYOSCYAMINE SULFATE 0.125 MG SL SUBL
0.1250 mg | SUBLINGUAL_TABLET | SUBLINGUAL | Status: DC | PRN
  Filled 2016-05-26: qty 1

## 2016-05-26 MED ORDER — LIDOCAINE 5 % EX PTCH
2.0000 | MEDICATED_PATCH | CUTANEOUS | Status: DC
  Administered 2016-05-26 – 2016-05-27 (×2): 1 via TRANSDERMAL
  Administered 2016-05-28 – 2016-05-30 (×3): 2 via TRANSDERMAL
  Administered 2016-05-31: 1 via TRANSDERMAL
  Administered 2016-06-01: 2 via TRANSDERMAL
  Administered 2016-06-02: 1 via TRANSDERMAL
  Administered 2016-06-03: 2 via TRANSDERMAL
  Administered 2016-06-04: 1 via TRANSDERMAL
  Filled 2016-05-26 (×11): qty 2

## 2016-05-26 NOTE — Progress Notes (Signed)
S: Pt notes continued L flank pain and pelvic pain but most bothersome to pt is pain "like I have a UTI." Pt notes urinary frequency with dysuria and constant bladder discomfort. Pt notes L flank pain prior to stent placement which resolved after the stent but new flank pain developing shortly post-op. Pt has been up walking in room and tolerating po. Pt reports scant vaginal bleeding.  O: Vitals:   05/26/16 0837 05/26/16 1133 05/26/16 1206 05/26/16 1430  BP:  114/69  120/71  Pulse:  (!) 102  (!) 102  Resp: 15 15 12 15   Temp:  98.8 F (37.1 C)  98.4 F (36.9 C)  TempSrc:  Oral  Oral  SpO2: 100% 100% 99% 99%  Weight:      Height:       Gen: alert, engaged in conversation, well groomed, smiling with conversation Abd: tender in LLQ, no rebound, no tympany, no guarding Inc: dressed, clean GU: deferred LE: SCDs in place  CBC    Component Value Date/Time   WBC 14.2 (H) 05/26/2016 0900   RBC 2.92 (L) 05/26/2016 0900   HGB 8.6 (L) 05/26/2016 0900   HCT 26.7 (L) 05/26/2016 0900   PLT 592 (H) 05/26/2016 0900   MCV 91.4 05/26/2016 0900   MCH 29.5 05/26/2016 0900   MCHC 32.2 05/26/2016 0900   RDW 13.9 05/26/2016 0900   LYMPHSABS 1.9 05/21/2016 0330   MONOABS 1.4 (H) 05/21/2016 0330   EOSABS 0.2 05/21/2016 0330   BASOSABS 0.0 05/21/2016 0330     A/P: Postop day #11 status post exploratory laparatomy with colectomy/colostomy LSOand removal of intra-abdominal abscesses/ POD #1 L ureteral stent placement  - Intra-abdominal abscess:  Initially recovering well from surgery with resolution of leukocytosis but increase in WBC prompted CT on 8/10 with concern for new abscess. Fallopian tube suspected etiology by initial CT scan. I reviewed images with radiology today and source of possible abscess unclear given degree of inflammatory changes.  Given operative findings, I do not think Left fallopian tube or ovary remain. I have left a message with pathologist to look again at the specimen to see  if ovarian and tubal tissue was with the abscess specimen. The original pathology report only commented on colonic tissue and 'adhesion.' I suspect tube and ovary were included in the adhesion. Regardless of the tissue source the abscess needs to be managed. Pt is not a candidate for repeat surgical exploration at this time (see Dr. Maris Bergerosenbower's note) and should gynecology be needed with a repeat surgical exploration, I recommend consulting gyn-oncology as they have more expertise in cases complicated by distorted anatomy and adhesive disease. Could also consider referral to a tertiary care center given the complexity of this case. For now, I agree with plans to manage this with antibiotics and appreciate the expertise by the ID team.   - Anemia. On Epo. Jehovah Witness and refuses blood products.   - Menstrual suppression. Depo given, may see scant vaginal bleeding a/w Depo shot, this should be minimal and last <2 wks. Desire to suppress menses to help with pelvic cramping and bleeding/ anemia.  - Psych. Again d/w pt my recommendation to start SSRI. Pt agrees today. This recommendation has been echoed by many on the care team.  - Pain. Appreciate Dr. Lamar BlinksGolding's assistance. I have recommended adding Uribel to help with the urinary pain a/w stent.   Please call me back with any new questions. I do not plan to round on this patient daily as  I do not think I am significantly adding to her care plan at this time.   [name] 05/26/2016 4:01 PM   Cell: [phone]

## 2016-05-26 NOTE — Progress Notes (Signed)
Patient ID: Sheila Willis, female   DOB: [birth year], 30 y.o.   MRN: [id number]  Assessment: Left extrinsic ureteral obstruction - She is improved clinically as far as her flank pain goes today.  She is still having some discomfort in the left flank with urination consistent with backflow pressure through the stent. I discussed why this was occurring and the fact that he had typically improves over time.  In addition she is having some irritative voiding symptoms due to the stent which I also indicated will improve over time in most cases.  I note she was prescribed hyoscyamine.  I have also recommended we start Myrbetriq 50 mg.  We discussed the need to leave the stent for several weeks to allow resolution of the inflammatory process and then consideration of removal at that time.  Plan:   Add Myrbetriq 50 mg.    Subjective: Patient reports experiencing irritative symptoms such as frequency, urgency and a feeling of needing to urinate with very little urine in the bladder consistent with irritation from the stent.  She also is feeling some fullness in the left flank with urination.  Objective: Vital signs in last 24 hours: Temp:  [98.4 F (36.9 C)-99.2 F (37.3 C)] 98.4 F (36.9 C) (08/14 1430) Pulse Rate:  [98-113] 102 (08/14 1430) Resp:  [12-16] 15 (08/14 1430) BP: (103-132)/(69-96) 120/71 (08/14 1430) SpO2:  [98 %-100 %] 99 % (08/14 1430)A  Intake/Output from previous day: 08/13 0701 - 08/14 0700 In: 1735.8 [P.O.:240; I.V.:1495.8] Out: 1151 [Urine:600; Stool:551] Intake/Output this shift: Total I/O In: 250 [P.O.:240; I.V.:10] Out: 400 [Urine:400]  Past Medical History:  Diagnosis Date  . Depression   . Gestational diabetes   . Headache(784.0)   . PCOS (polycystic ovarian syndrome)     Physical Exam:  Lungs - Normal respiratory effort, chest expands symmetrically.  Abdomen - Soft, non-tender & non-distended.  Lab Results:  Recent Labs  05/26/16 0900  WBC 14.2*  HGB  8.6*  HCT 26.7*   BMET  Recent Labs  05/26/16 0900  NA 137  K 3.8  CL 102  CO2 28  GLUCOSE 99  BUN 13  CREATININE 0.70  CALCIUM 9.4   No results for input(s): LABURIN in the last 72 hours. Results for orders placed or performed during the hospital encounter of 05/13/16  Blood culture (routine x 2)     Status: None   Collection Time: 05/13/16  9:54 AM  Result Value Ref Range Status   Specimen Description BLOOD RIGHT ANTECUBITAL  Final   Special Requests BOTTLES DRAWN AEROBIC AND ANAEROBIC 5ML  Final   Culture   Final    NO GROWTH 5 DAYS [facility]    Report Status 05/18/2016 FINAL  Final  Blood culture (routine x 2)     Status: None   Collection Time: 05/13/16 10:42 AM  Result Value Ref Range Status   Specimen Description BLOOD LEFT ANTECUBITAL  Final   Special Requests BOTTLES DRAWN AEROBIC AND ANAEROBIC 5CC  Final   Culture   Final    NO GROWTH 5 DAYS [facility]formed at [facility]    Report Status 05/18/2016 FINAL  Final  Culture, Urine     Status: Abnormal   Collection Time: 05/13/16  1:00 PM  Result Value Ref Range Status   Specimen Description URINE, RANDOM  Final   Special Requests NONE  Final   Culture (A)  Final    <10,000 COLONIES/mL INSIGNIFICANT GROWTH Performed at [facility]    Report Status 05/14/2016 FINAL  Final  Surgical pcr screen     Status: None   Collection Time: 05/25/16  4:38 AM  Result Value Ref Range Status   MRSA, PCR NEGATIVE NEGATIVE Final   Staphylococcus aureus NEGATIVE NEGATIVE Final    Comment:        The Xpert SA Assay (FDA approved for NASAL specimens in patients over 521 years of age), is one component of a comprehensive surveillance program.  Test performance has been validated by [facility] for patients greater than or equal to 32 year old. It is not intended to diagnose infection nor to guide or monitor treatment.     Studies/Results: No results found.    Ruthella Kirchman  C 05/26/2016, 5:06 PM

## 2016-05-26 NOTE — Progress Notes (Signed)
[facility]    Date of Admission:  05/13/2016   Total days of antibiotics 14        Day 5 cefotetan/doxy          ID: Sheila Willis is a 30 y.o. female with complex, recurrent left tubo-ovarian abscess, and colocutaneous fistula POD#4 S/p ex lap with drainage of abscess and partial descending colectomy, appendectomy, , and partial R salpingectomy and colostomy placement and ureter stent placement Active Problems:   Intra-abdominal abscess (HCC)   Pelvic abscess in female    Subjective: She remains afebrile. She left ureteral stent placement yesterday for left hydronephrosis due to complex pelvic mass causing external compression, but still has pain associated on left, with mild urinary discomfort  Objective: WBC at 145K, also elevated platelets nearly 600s  Medications:  . acetaminophen  1,000 mg Oral TID  . bisacodyl  5 mg Oral QHS  . cefoTEtan (CEFOTAN) IV  2 g Intravenous Q12H  . doxycycline (VIBRAMYCIN) IV  100 mg Intravenous Q12H  . DULoxetine  30 mg Oral Daily  . famotidine  20 mg Oral QHS  . feeding supplement (ENSURE ENLIVE)  237 mL Oral TID BM  . fentaNYL   Intravenous Q4H  . heparin subcutaneous  5,000 Units Subcutaneous Q8H  . lidocaine  2 patch Transdermal Q24H  . lip balm  1 application Topical BID  . saccharomyces boulardii  250 mg Oral BID  . URELLE  1 tablet Oral QID    Objective: Vital signs in last 24 hours: Temp:  [98.4 F (36.9 C)-99.2 F (37.3 C)] 98.4 F (36.9 C) (08/14 1430) Pulse Rate:  [98-113] 102 (08/14 1430) Resp:  [12-16] 15 (08/14 1430) BP: (103-132)/(69-96) 120/71 (08/14 1430) SpO2:  [98 %-100 %] 99 % (08/14 1430) Physical Exam  Constitutional:  oriented to person, place, and time. appears well-developed and well-nourished. In mild distress. Intermittently sleeping during exam HENT: [place]/AT, PERRLA, no scleral icterus Mouth/Throat: Oropharynx is clear and moist. No oropharyngeal exudate.  Cardiovascular:  regular rhythm and normal heart sounds. Exam reveals no gallop and no friction rub.  No murmur heard.  Pulmonary/Chest: Effort normal and breath sounds normal. No respiratory distress.  has no wheezes.  Abdominal: Soft. Bowel sounds are present. There is RLQ drain with serosanginous fluid in bulb. And ostomy in Left Quadrant. Midline has bandage covering laparotomy Lymphadenopathy: no cervical adenopathy. No axillary adenopathy Neurological: alert and oriented to person, place, and time.  Skin: Skin is warm and dry. No rash noted. No erythema.    Lab Results  Recent Labs  05/26/16 0900  WBC 14.2*  HGB 8.6*  HCT 26.7*  NA 137  K 3.8  CL 102  CO2 28  BUN 13  CREATININE 0.70   Microbiology: 8/1 blood cx ngtd  Historic micro: gram stain showed gpr and gnr  Escherichia coli    MIC    AMPICILLIN >=32 RESIST... Resistant    AMPICILLIN/SULBACTAM 16 INTERMED... Intermediate    CEFAZOLIN <=4 SENSITIVE "><=4 SENSITIVE  Sensitive    CEFEPIME <=1 SENSITIVE "><=1 SENSITIVE  Sensitive    CEFTAZIDIME <=1 SENSITIVE "><=1 SENSITIVE  Sensitive    CEFTRIAXONE <=1 SENSITIVE "><=1 SENSITIVE  Sensitive    CIPROFLOXACIN <=0.25 SENSITIVE "><=0.25 SENS... Sensitive    GENTAMICIN >=16 RESIST... Resistant    IMIPENEM <=0.25 SENSITIVE "><=0.25 SENS... Sensitive    PIP/TAZO <=4 SENSITIVE "><=4 SENSITIVE  Sensitive    TRIMETH/SULFA >=320 RESIS... Resistant  Susceptibility Comments   Escherichia coli  ABUNDANT ESCHERICHIA COLI    Specimen Collected: 04/21/16 10:59 Last Resulted: 04/26/16 15:23                      Encounter   View Encounter           Studies/Results: abd ct: Postoperative changes related to recent partial colectomy and left lower quadrant colostomy, as above. Small low-attenuation rim enhancing gas and fluid collection immediately deep to the healing anterior abdominal wound. This may simply represent a resolving postoperative seroma, however, the  possibility of a small abscess in this region is not excluded. No other well-defined intra abdominal abscess is noted. 2. However, there is tubular dilatation of a low-attenuation rim enhancing structure in the left adnexa, which is concerning for persistent left-sided tubo-ovarian abscess this is surrounded by inflammatory mass (likely phlegmon involving the left ovary/adenxal structures), which is exerting significant mass effect upon adjacent structures, including the distal third of the left ureter which is resulting in moderate left-sided hydroureteronephrosis which is new compared to the prior examination. 3. Small amount of gas non dependently in the lumen of the urinary bladder. This is likely iatrogenic if there has been recent indwelling Foley catheter (no Foley catheter is noted on today's examination). In the absence of a history of recent catheterization, correlation with urinalysis would be recommended to exclude urinary tract infection with gas-forming organisms. 4. Additional incidental findings, as above.  Assessment/Plan: Intra-abdominal abscess from colocutaneous fistula and recurrent left TOA = slight decrease in wbc since abtx change. Continue on cefotetan (still provides coverage for prior e.coli isolate) plus doxycycline (covers actinomyces). Recommend to repeat abd CT mid-late week to see if there is improvement in ring enhancing lesion/abscess to determine if need IR drainage. Still has much inflammation since plt continue to trend up  Abdominal pain, post surgery = continue with management plan as recommended by Dr. Phillips OdorGolding with ultimate plan to take  total fentanyl dosing to see if can transition to patch equivalent.   AKI = improved.   Anemia = hgb at 8.6. stable   [place] Capers, [facility] Cell: [phone] Pager: [phone]  05/26/2016, 3:44 PM

## 2016-05-26 NOTE — Progress Notes (Signed)
Nutrition Follow-up  DOCUMENTATION CODES:   Not applicable  INTERVENTION:  -Continue Ensure Enlive po BID, each supplement provides 350 kcal and 20 grams of protein -RD to continue to monitor  NUTRITION DIAGNOSIS:   Inadequate oral intake related to poor appetite, other (see comment) (Pain) as evidenced by per patient/family report. -ongoing  GOAL:   Patient will meet greater than or equal to 90% of their needs -progressing MONITOR:   PO intake, I & O's, Labs, Weight trends, Supplement acceptance  ASSESSMENT:   Patient reports increased abdominal pain starting on 05/11/16 pain is in the lower abdomen and her back some cramping and some vaginal bleeding. She has felt warm when sleeping but no measured fevers.  Ms. Sheila Willis was up walking the halls today. Walking very slowly, in some pain. She is s/p L Ureteral Stent placement for L uretal obstruction and Hydronephrosis w/ flank pain. Appetite is improving some, but fluctuating. Over previous 3 meals, averaging 60% intake This morning she had [place] toast and hot chocolate which consumed "almost all of it." No new wts. Consuming EE TID regularly. Monitor PO intake, seems to be improving.  Labs and medications reviewed: D5 @ 7950mL/hr --> 204 calories  Diet Order:  Diet regular Room service appropriate? Yes; Fluid consistency: Thin  Skin:  Reviewed, no issues  Last BM:  8/1  Height:   Ht Readings from Last 1 Encounters:  05/13/16 5\' 2"  (1.575 m)    Weight:   Wt Readings from Last 1 Encounters:  05/13/16 128 lb (58.1 kg)    Ideal Body Weight:  50 kg  BMI:  Body mass index is 23.41 kg/m.  Estimated Nutritional Needs:   Kcal:  1500-1800 calories  Protein:  60-75 grams  Fluid:  >/= 1.5L  EDUCATION NEEDS:   No education needs identified at this time  Dionne AnoWilliam M. Georgenia Salim, MS, RD LDN Inpatient Clinical Dietitian Pager [phone]

## 2016-05-26 NOTE — Progress Notes (Signed)
1 Day Post-Op  Subjective: Pain: 3778 MCG fentanyl yesterday,if I calculated correctly.  She got some dilaudid yesterday 1 mg Today:  2949 mcg fentanyl so far today. Ongoing pain with Stent, says it hurts to void also.  Each time she voids now.  Ostomy is working and she is eating.  Open wound looks fine, but her reaction to it is way more than normal for the time it has been there.  Dr. Phillips OdorGolding is in with her now and working on pain issues.     Objective: Vital signs in last 24 hours: Temp:  [98.6 F (37 C)-99.2 F (37.3 C)] 98.8 F (37.1 C) (08/14 1133) Pulse Rate:  [98-113] 102 (08/14 1133) Resp:  [12-16] 12 (08/14 1206) BP: (103-132)/(69-96) 114/69 (08/14 1133) SpO2:  [98 %-100 %] 99 % (08/14 1206) Last BM Date: 05/26/16 PO 240 recorded yesterday 1495 urine yesterday Afebrile, VSS still tachycardic WBC Minimally better Creatinine is normal now.    Intake/Output from previous day: 08/13 0701 - 08/14 0700 In: 1735.8 [P.O.:240; I.V.:1495.8] Out: 1151 [Urine:600; Stool:551] Intake/Output this shift: Total I/O In: 130 [P.O.:120; I.V.:10] Out: 400 [Urine:400]  General appearance: alert, cooperative and no distress GI: soft, always tender, wound OK dressing needs to be deeper.  + BS,.and ostomy working.  Lab Results:   Recent Labs  05/26/16 0900  WBC 14.2*  HGB 8.6*  HCT 26.7*  PLT 592*    BMET  Recent Labs  05/26/16 0900  NA 137  K 3.8  CL 102  CO2 28  GLUCOSE 99  BUN 13  CREATININE 0.70  CALCIUM 9.4   PT/INR No results for input(s): LABPROT, INR in the last 72 hours.   Recent Labs Lab 05/22/16 0515  AST 11*  ALT 7*  ALKPHOS 83  BILITOT 0.5  PROT 7.0  ALBUMIN 2.5*     Lipase     Component Value Date/Time   LIPASE 26 05/13/2016 0953     Studies/Results: Dg Abd 1 View  Result Date: 05/25/2016 CLINICAL DATA:  Left ureteral obstruction. EXAM: ABDOMEN - 1 VIEW; DG C-ARM 1-60 MIN-NO REPORT COMPARISON:  CTA 07/2016 FINDINGS: Retrograde  pyelogram on the left. Left ureter is diffusely dilated with mild narrowing distally probably due to extrinsic compression. No focal stricture or mass. Left ureteral stent placed in good position. IMPRESSION: Left ureteral dilatation.  Left ureteral stent placed. Electronically Signed   By: Marlan Palauharles  Clark M.D.   On: 05/25/2016 08:50   Dg C-arm 1-60 Min-no Report  Result Date: 05/25/2016 CLINICAL DATA:  Left ureteral obstruction. EXAM: ABDOMEN - 1 VIEW; DG C-ARM 1-60 MIN-NO REPORT COMPARISON:  CTA 07/2016 FINDINGS: Retrograde pyelogram on the left. Left ureter is diffusely dilated with mild narrowing distally probably due to extrinsic compression. No focal stricture or mass. Left ureteral stent placed in good position. IMPRESSION: Left ureteral dilatation.  Left ureteral stent placed. Electronically Signed   By: Marlan Palauharles  Clark M.D.   On: 05/25/2016 08:50    Medications: . acetaminophen  1,000 mg Oral TID  . cefoTEtan (CEFOTAN) IV  2 g Intravenous Q12H  . doxycycline (VIBRAMYCIN) IV  100 mg Intravenous Q12H  . DULoxetine  30 mg Oral Daily  . famotidine  20 mg Oral QHS  . feeding supplement (ENSURE ENLIVE)  237 mL Oral TID BM  . fentaNYL   Intravenous Q4H  . heparin subcutaneous  5,000 Units Subcutaneous Q8H  . lidocaine  1 patch Transdermal Q24H  . lip balm  1 application Topical BID  .  methylnaltrexone  8 mg Subcutaneous Once  . saccharomyces boulardii  250 mg Oral BID    Assessment/Plan Recurrent/multiple Intra-abdominal abscess,with sigmoid fistula on IR injection 04/24/16 Status post exploratory laparotomy, drainage of intra-abdominal abscesses, partial colectomy and colostomy. Cystoscopy and incidental appendectomy 05/15/16 Dr. Abbey Chattersosenbower POD 11 Left Hydronephrosis  S/p Cystoscopy with left retrograde pyelogram and interpretation. Insertion of left ureteral stent, 5 x 22 Polaris, no tether. 05/25/16 Dr. Sebastian Acheheodore Manny   Anemia - Jehovah witness - EPO Pain control Depression FEN:  soft diet /IV fluids - decrease IV f ID: Day 10Zosyn 05/22/16, discontinued by ID. Cefotetan and doxycycline started 05/22/16 - Dr. Drue SecondSnider ID -day 5  DVT: Heparin   Plan:  I think she is doing fine from the Hartman's procedure.  She continues to complain of pain with the stent, and with voiding.  Checking UA/Culture.    Pain and her coping issues also a big part of ongoing issues.    LOS: 13 days    Sheila Willis 05/26/2016 [phone]

## 2016-05-27 DIAGNOSIS — R208 Other disturbances of skin sensation: Secondary | ICD-10-CM

## 2016-05-27 DIAGNOSIS — T8384XA Pain from genitourinary prosthetic devices, implants and grafts, initial encounter: Secondary | ICD-10-CM | POA: Diagnosis not present

## 2016-05-27 DIAGNOSIS — F411 Generalized anxiety disorder: Secondary | ICD-10-CM | POA: Diagnosis present

## 2016-05-27 DIAGNOSIS — T402X5A Adverse effect of other opioids, initial encounter: Secondary | ICD-10-CM

## 2016-05-27 MED ORDER — FENTANYL 40 MCG/ML IV SOLN
INTRAVENOUS | Status: DC
  Administered 2016-05-27: 578.5 ug via INTRAVENOUS
  Administered 2016-05-28: 525 ug via INTRAVENOUS
  Administered 2016-05-28: 300 ug via INTRAVENOUS
  Administered 2016-05-28: 245 ug via INTRAVENOUS
  Administered 2016-05-28: 08:00:00 via INTRAVENOUS
  Administered 2016-05-28: 237.2 ug via INTRAVENOUS
  Administered 2016-05-28: 225 ug via INTRAVENOUS
  Administered 2016-05-28: 659.7 ug via INTRAVENOUS
  Administered 2016-05-29: 40 ug via INTRAVENOUS
  Administered 2016-05-29: 300 ug via INTRAVENOUS
  Administered 2016-05-29: 150 ug via INTRAVENOUS
  Filled 2016-05-27 (×4): qty 25

## 2016-05-27 MED ORDER — CLONAZEPAM 1 MG PO TABS
1.0000 mg | ORAL_TABLET | Freq: Once | ORAL | Status: AC
  Administered 2016-05-27: 1 mg via ORAL
  Filled 2016-05-27: qty 1

## 2016-05-27 NOTE — Progress Notes (Signed)
Patient ID: Sheila Willis, female   DOB: Feb 08, 1986, 30 y.o.   MRN: [id number]  Assessment: Left ureteral obstruction - She seems to be tolerating the stent a little bit better today.  Her creatinine remains normal.  She was having irritative voiding symptoms so I started Myrbetriq.  I note her urine was checked and had blood cells as to be expected with a stent in place but did not appear infected.  A culture was performed however she is currently on antibiotics and I believe this has a low probability of being positive.  Her urinary symptoms are almost certainly from her stent.  Plan: Her stent will remain indwelling and will be removed after inflammation has resolved as an outpatient.    Subjective: Patient reports no new complaints today.  Objective: Vital signs in last 24 hours: Temp:  [97.4 F (36.3 C)-98.8 F (37.1 C)] 97.9 F (36.6 C) (08/15 0528) Pulse Rate:  [82-102] 89 (08/15 0528) Resp:  [12-16] 16 (08/15 0919) BP: (109-120)/(69-78) 109/78 (08/15 0528) SpO2:  [93 %-100 %] 100 % (08/15 0528)A  Intake/Output from previous day: 08/14 0701 - 08/15 0700 In: 1700 [P.O.:240; I.V.:1210; IV Piggyback:250] Out: 400 [Urine:400] Intake/Output this shift: Total I/O In: -  Out: 250 [Urine:250]  Past Medical History:  Diagnosis Date  . Depression   . Gestational diabetes   . Headache(784.0)   . PCOS (polycystic ovarian syndrome)     Physical Exam:  Lungs - Normal respiratory effort, chest expands symmetrically.  Abdomen - Soft, non-tender & non-distended.  Lab Results:  Recent Labs  05/26/16 0900  WBC 14.2*  HGB 8.6*  HCT 26.7*   BMET  Recent Labs  05/26/16 0900  NA 137  K 3.8  CL 102  CO2 28  GLUCOSE 99  BUN 13  CREATININE 0.70  CALCIUM 9.4   No results for input(s): LABURIN in the last 72 hours. Results for orders placed or performed during the hospital encounter of 05/13/16  Blood culture (routine x 2)     Status: None   Collection Time: 05/13/16   9:54 AM  Result Value Ref Range Status   Specimen Description BLOOD RIGHT ANTECUBITAL  Final   Special Requests BOTTLES DRAWN AEROBIC AND ANAEROBIC 5ML  Final   Culture   Final    NO GROWTH 5 DAYS [facility]formed [facility]    Report Status 05/18/2016 FINAL  Final  Blood culture (routine x 2)     Status: None   Collection Time: 05/13/16 10:42 AM  Result Value Ref Range Status   Specimen Description BLOOD LEFT ANTECUBITAL  Final   Special Requests BOTTLES DRAWN AEROBIC AND ANAEROBIC 5CC  Final   Culture   Final    NO GROWTH 5 DAYS Performed at [facility]    Report Status 05/18/2016 FINAL  Final  Culture, Urine     Status: Abnormal   Collection Time: 05/13/16  1:00 PM  Result Value Ref Range Status   Specimen Description URINE, RANDOM  Final   Special Requests NONE  Final   Culture (A)  Final    <10,000 COLONIES/mL INSIGNIFICANT GROWTH Performed at [facility]    Report Status 05/14/2016 FINAL  Final  Surgical pcr screen     Status: None   Collection Time: 05/25/16  4:38 AM  Result Value Ref Range Status   MRSA, PCR NEGATIVE NEGATIVE Final   Staphylococcus aureus NEGATIVE NEGATIVE Final    Comment:  The Xpert SA Assay (FDA approved for NASAL specimens in patients over 32 years of age), is one component of a comprehensive surveillance program.  Test performance has been validated by [facility] for patients greater than or equal to 32 year old. It is not intended to diagnose infection nor to guide or monitor treatment.     Studies/Results: No results found.    Orpheus Hayhurst C 05/27/2016, 9:33 AM
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
## 2016-05-27 NOTE — Consult Note (Signed)
WOC Nurse ostomy follow up Stoma type/location: LLQ Colostomy. Pouch changes are twice weekly on Tuesdays and Fridays  Stomal assessment/size: 1 and 1/2 inches round when gentle traction is applied to skin at 12 o'clock.  Slightly oval at rest Peristomal assessment: intact, clear. Crease at 9 o'clock Treatment options for stomal/peristomal skin: skin barrier ring Output: brown, stool Ostomy pouching: 2pc. 2 and 1/4 inch pouching system with skin barrier ring Education provided: Patient is independent in emptying pouching system and assists with changes; mother also likes to assist but is not here today. [place] in [facility] [facility] [place] Dis[place] program: Yes WOC nursing team will follow, and will remain available to this patient, the nursing, surgical and medical teams.   Thanks, Ladona MowLaurie Apollonia Amini, MSN, RN, GNP, Hans EdenCWOCN, CWON-AP, FAAN  Pager# [phone]

## 2016-05-27 NOTE — Progress Notes (Signed)
Patient ID: Sheila Willis, female   DOB: Sep 03, 1986, 30 y.o.   MRN: [id number] 2 Days Post-Op  Subjective: Smiling, feels better, less pain. No new C/O, trying to eat but not much appetite  Objective: Vital signs in last 24 hours: Temp:  [97.4 F (36.3 C)-98.6 F (37 C)] 98.6 F (37 C) (08/15 1006) Pulse Rate:  [82-102] 100 (08/15 1006) Resp:  [15-16] 16 (08/15 1006) BP: (109-120)/(70-78) 109/72 (08/15 1006) SpO2:  [93 %-100 %] 100 % (08/15 1006) Last BM Date: 05/26/16  Intake/Output from previous day: 08/14 0701 - 08/15 0700 In: 1700 [P.O.:240; I.V.:1210; IV Piggyback:250] Out: 400 [Urine:400] Intake/Output this shift: Total I/O In: -  Out: 250 [Urine:250]  General appearance: alert, cooperative and no distress GI: normal findings: soft, non-tender and non distended.  Colostomy functioning Incision/Wound: Dressing clean and dry  Lab Results:   Recent Labs  05/26/16 0900  WBC 14.2*  HGB 8.6*  HCT 26.7*  PLT 592*   BMET  Recent Labs  05/26/16 0900  NA 137  K 3.8  CL 102  CO2 28  GLUCOSE 99  BUN 13  CREATININE 0.70  CALCIUM 9.4     Studies/Results: No results found.  Anti-infectives: Anti-infectives    Start     Dose/Rate Route Frequency Ordered Stop   05/22/16 2200  cefoTEtan (CEFOTAN) 2 g in dextrose 5 % 50 mL IVPB     2 g 100 mL/hr over 30 Minutes Intravenous Every 12 hours 05/22/16 1702     05/22/16 1800  doxycycline (VIBRAMYCIN) 100 mg in dextrose 5 % 250 mL IVPB     100 mg 125 mL/hr over 120 Minutes Intravenous Every 12 hours 05/22/16 1702     05/15/16 1600  piperacillin-tazobactam (ZOSYN) IVPB 3.375 g  Status:  Discontinued     3.375 g 12.5 mL/hr over 240 Minutes Intravenous Every 8 hours 05/15/16 1547 05/22/16 1701   05/15/16 0830  cefoTEtan (CEFOTAN) 2 g in dextrose 5 % 50 mL IVPB     2 g 100 mL/hr over 30 Minutes Intravenous On call to O.R. 05/14/16 0920 05/15/16 0945   05/14/16 0930  cefoTEtan (CEFOTAN) 2 g in dextrose 5 % 50 mL IVPB   Status:  Discontinued     2 g 100 mL/hr over 30 Minutes Intravenous On call to O.R. 05/14/16 [id number] 05/14/16 0921   05/13/16 2200  vancomycin (VANCOCIN) IVPB 750 mg/150 ml premix  Status:  Discontinued     750 mg 150 mL/hr over 60 Minutes Intravenous Every 12 hours 05/13/16 1024 05/13/16 1529   05/13/16 1600  piperacillin-tazobactam (ZOSYN) IVPB 3.375 g  Status:  Discontinued     3.375 g 12.5 mL/hr over 240 Minutes Intravenous Every 8 hours 05/13/16 1024 05/15/16 1547   05/13/16 1015  vancomycin (VANCOCIN) IVPB 1000 mg/200 mL premix     1,000 mg 200 mL/hr over 60 Minutes Intravenous  Once 05/13/16 1006 05/13/16 1233   05/13/16 1015  piperacillin-tazobactam (ZOSYN) IVPB 3.375 g     3.375 g 100 mL/hr over 30 Minutes Intravenous  Once 05/13/16 1006 05/13/16 1104      Assessment/Plan: s/p Procedure(s): 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  Cont abx and wound care. CBC in AM    LOS: 14 days    Sheila Willis 05/27/2016

## 2016-05-28 DIAGNOSIS — F411 Generalized anxiety disorder: Secondary | ICD-10-CM

## 2016-05-28 DIAGNOSIS — T8384XS Pain from genitourinary prosthetic devices, implants and grafts, sequela: Secondary | ICD-10-CM

## 2016-05-28 LAB — CBC
HEMATOCRIT: 25.8 % — AB (ref 36.0–46.0)
Hemoglobin: 8.5 g/dL — ABNORMAL LOW (ref 12.0–15.0)
MCH: 29.2 pg (ref 26.0–34.0)
MCHC: 32.9 g/dL (ref 30.0–36.0)
MCV: 88.7 fL (ref 78.0–100.0)
PLATELETS: 532 10*3/uL — AB (ref 150–400)
RBC: 2.91 MIL/uL — ABNORMAL LOW (ref 3.87–5.11)
RDW: 13.6 % (ref 11.5–15.5)
WBC: 10.8 10*3/uL — AB (ref 4.0–10.5)

## 2016-05-28 LAB — URINE CULTURE: Culture: NO GROWTH

## 2016-05-28 NOTE — Progress Notes (Signed)
3 Days Post-Op  Subjective: She seems better this AM.  Smiling, ate breakfast, no supper. Boyfriend is on the phone with her.  Sites OK, nothing is in the ostomy right now.  She seems to be having less pain.  Objective: Vital signs in last 24 hours: Temp:  [98.1 F (36.7 C)-98.6 F (37 C)] 98.2 F (36.8 C) (08/16 0529) Pulse Rate:  [80-100] 82 (08/16 0529) Resp:  [13-16] 15 (08/16 0735) BP: (99-119)/(59-76) 104/70 (08/16 0529) SpO2:  [99 %-100 %] 100 % (08/16 0735) Last BM Date: 05/27/16 360 PO Urine 600 Ostomy 100 recorded Drain 7 Afebrile, VSS WBC continue to improve Prealbumin is 12.8 No films last CT on 05/22/16, will repeat CT tomorrow Intake/Output from previous day: 08/15 0701 - 08/16 0700 In: 2360 [P.O.:360; I.V.:1200; IV Piggyback:800] Out: 707 [Urine:600; Drains:7; Stool:100] Intake/Output this shift: No intake/output data recorded.  General appearance: alert, cooperative and no distress Resp: clear to auscultation bilaterally GI: soft, few BS, open wound OK, nothing in ostomy right now.    Lab Results:   Recent Labs  05/26/16 0900 05/28/16 0400  WBC 14.2* 10.8*  HGB 8.6* 8.5*  HCT 26.7* 25.8*  PLT 592* 532*    BMET  Recent Labs  05/26/16 0900  NA 137  K 3.8  CL 102  CO2 28  GLUCOSE 99  BUN 13  CREATININE 0.70  CALCIUM 9.4   PT/INR No results for input(s): LABPROT, INR in the last 72 hours.   Recent Labs Lab 05/22/16 0515 05/26/16 0900  AST 11* 14*  ALT 7* 6*  ALKPHOS 83 98  BILITOT 0.5 0.3  PROT 7.0 7.6  ALBUMIN 2.5* 2.4*     Lipase     Component Value Date/Time   LIPASE 26 05/13/2016 0953     Studies/Results: No results found.  Medications: . acetaminophen  1,000 mg Oral TID  . bisacodyl  5 mg Oral QHS  . cefoTEtan (CEFOTAN) IV  2 g Intravenous Q12H  . doxycycline (VIBRAMYCIN) IV  100 mg Intravenous Q12H  . famotidine  20 mg Oral QHS  . feeding supplement (ENSURE ENLIVE)  237 mL Oral TID BM  . fentaNYL    Intravenous Q4H  . heparin subcutaneous  5,000 Units Subcutaneous Q8H  . lidocaine  2 patch Transdermal Q24H  . lip balm  1 application Topical BID  . mirabegron ER  50 mg Oral Daily  . saccharomyces boulardii  250 mg Oral BID  . URELLE  1 tablet Oral QID   . 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 Cystoscopy and incidental appendectomy 05/15/16 Dr. Abbey Chattersosenbower POD 11 Left Hydronephrosis  S/p Cystoscopy with left retrograde pyelogram and interpretation. Insertion of left ureteral stent, 5 x 22 Polaris, no tether. 05/25/16 Dr. Sebastian Acheheodore Manny   Anemia - Jehovah witness - EPO Pain control Depression Malnutrition  Prealbumin 12.8 -  05/27/16 FEN:  Regular diet /IV fluids - decrease IV f ID: Day 10Zosyn 05/22/16, discontinued by ID. Cefotetan and doxycycline started 05/22/16 - Dr. Drue SecondSnider ID -day 7 DVT: Heparin  Plan:  Appreciate Dr. Lamar BlinksGolding's note and assistance.  Plan to repeat CT tomorrow.       LOS: 15 days    Sheila Willis 05/28/2016 [phone]

## 2016-05-28 NOTE — Progress Notes (Signed)
Patient ID: Sheila Willis, female   DOB: Jun 30, 1986, 30 y.o.   MRN: [id number] [facility] Progress Note:   3 Days Post-Op  Subjective: Mental status is clear.  Pleasant.   Objective: Vital signs in last 24 hours: Temp:  [98.1 F (36.7 C)-98.6 F (37 C)] 98.6 F (37 C) (08/16 0955) Pulse Rate:  [80-108] 108 (08/16 0955) Resp:  [13-16] 15 (08/16 0955) BP: (99-119)/(59-76) 108/76 (08/16 0955) SpO2:  [98 %-100 %] 98 % (08/16 0955)  Intake/Output from previous day: 08/15 0701 - 08/16 0700 In: 2360 [P.O.:360; I.V.:1200; IV Piggyback:800] Out: 707 [Urine:600; Drains:7; Stool:100] Intake/Output this shift: Total I/O In: 120 [P.O.:120] Out: 900 [Urine:900]  Physical Exam: Work of breathing is not labored.  Abdomen is nontender  Lab Results:  Results for orders placed or performed during the hospital encounter of 05/13/16 (from the past 48 hour(s))  Prealbumin     Status: Abnormal   Collection Time: 05/26/16  2:20 PM  Result Value Ref Range   Prealbumin 12.8 (L) 18 - 38 mg/dL    Comment: Performed at [facility]  CBC     Status: Abnormal   Collection Time: 05/28/16  4:00 AM  Result Value Ref Range   WBC 10.8 (H) 4.0 - 10.5 K/uL   RBC 2.91 (L) 3.87 - 5.11 MIL/uL   Hemoglobin 8.5 (L) 12.0 - 15.0 g/dL   HCT 40.925.8 (L) 81.136.0 - 91.446.0 %   MCV 88.7 78.0 - 100.0 fL   MCH 29.2 26.0 - 34.0 pg   MCHC 32.9 30.0 - 36.0 g/dL   RDW 78.213.6 95.611.5 - 21.315.5 %   Platelets 532 (H) 150 - 400 K/uL    Radiology/Results: No results found.  Anti-infectives: Anti-infectives    Start     Dose/Rate Route Frequency Ordered Stop   05/22/16 2200  cefoTEtan (CEFOTAN) 2 g in dextrose 5 % 50 mL IVPB     2 g 100 mL/hr over 30 Minutes Intravenous Every 12 hours 05/22/16 1702     05/22/16 1800  doxycycline (VIBRAMYCIN) 100 mg in dextrose 5 % 250 mL IVPB     100 mg 125 mL/hr over 120 Minutes Intravenous Every 12 hours 05/22/16 1702     05/15/16 1600  piperacillin-tazobactam (ZOSYN) IVPB 3.375  g  Status:  Discontinued     3.375 g 12.5 mL/hr over 240 Minutes Intravenous Every 8 hours 05/15/16 1547 05/22/16 1701   05/15/16 0830  cefoTEtan (CEFOTAN) 2 g in dextrose 5 % 50 mL IVPB     2 g 100 mL/hr over 30 Minutes Intravenous On call to O.R. 05/14/16 0920 05/15/16 0945   05/14/16 0930  cefoTEtan (CEFOTAN) 2 g in dextrose 5 % 50 mL IVPB  Status:  Discontinued     2 g 100 mL/hr over 30 Minutes Intravenous On call to O.R. 05/14/16 [id number] 05/14/16 0921   05/13/16 2200  vancomycin (VANCOCIN) IVPB 750 mg/150 ml premix  Status:  Discontinued     750 mg 150 mL/hr over 60 Minutes Intravenous Every 12 hours 05/13/16 1024 05/13/16 1529   05/13/16 1600  piperacillin-tazobactam (ZOSYN) IVPB 3.375 g  Status:  Discontinued     3.375 g 12.5 mL/hr over 240 Minutes Intravenous Every 8 hours 05/13/16 1024 05/15/16 1547   05/13/16 1015  vancomycin (VANCOCIN) IVPB 1000 mg/200 mL premix     1,000 mg 200 mL/hr over 60 Minutes Intravenous  Once 05/13/16 1006 05/13/16 1233   05/13/16 1015  piperacillin-tazobactam (ZOSYN) IVPB 3.375 g  3.375 g 100 mL/hr over 30 Minutes Intravenous  Once 05/13/16 1006 05/13/16 1104      Assessment/Plan: Problem List: Patient Active Problem List   Diagnosis Date Noted  . 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 Anxiety state 05/27/2016  . Pain due to ureteral stent (HCC) 05/27/2016  . Pelvic abscess in female 05/15/2016  . Intra-abdominal abscess (HCC) 05/13/2016  . TOA (tubo-ovarian abscess) 04/19/2016  . Pelvic pain in female 04/18/2016  . PCOS (polycystic ovarian syndrome) 11/14/2012  . Gestational diabetes mellitus, class A1 11/14/2012  . SVD (spontaneous vaginal delivery) 11/13/2012  . Postpartum care following vaginal delivery (2/1) 11/13/2012    Plan CT scan tomorrow to followup rectal drain 3 Days Post-Op    LOS: 15 days   Matt B. Daphine DeutscherMartin, MD, [facility]  [facility] [phone] beeper [phone]  05/28/2016 2:12 PM

## 2016-05-29 ENCOUNTER — Inpatient Hospital Stay (HOSPITAL_COMMUNITY): Payer: Commercial Managed Care - PPO

## 2016-05-29 ENCOUNTER — Encounter (HOSPITAL_COMMUNITY): Payer: Self-pay | Admitting: Radiology

## 2016-05-29 LAB — COMPREHENSIVE METABOLIC PANEL
ALK PHOS: 113 U/L (ref 38–126)
ALT: 7 U/L — AB (ref 14–54)
AST: 14 U/L — ABNORMAL LOW (ref 15–41)
Albumin: 2.4 g/dL — ABNORMAL LOW (ref 3.5–5.0)
Anion gap: 8 (ref 5–15)
BILIRUBIN TOTAL: 0.3 mg/dL (ref 0.3–1.2)
BUN: 13 mg/dL (ref 6–20)
CALCIUM: 9.1 mg/dL (ref 8.9–10.3)
CO2: 30 mmol/L (ref 22–32)
CREATININE: 0.77 mg/dL (ref 0.44–1.00)
Chloride: 99 mmol/L — ABNORMAL LOW (ref 101–111)
Glucose, Bld: 93 mg/dL (ref 65–99)
Potassium: 4.2 mmol/L (ref 3.5–5.1)
SODIUM: 137 mmol/L (ref 135–145)
TOTAL PROTEIN: 7.5 g/dL (ref 6.5–8.1)

## 2016-05-29 LAB — CBC
HEMATOCRIT: 28.4 % — AB (ref 36.0–46.0)
HEMOGLOBIN: 9.3 g/dL — AB (ref 12.0–15.0)
MCH: 29.3 pg (ref 26.0–34.0)
MCHC: 32.7 g/dL (ref 30.0–36.0)
MCV: 89.6 fL (ref 78.0–100.0)
Platelets: 647 10*3/uL — ABNORMAL HIGH (ref 150–400)
RBC: 3.17 MIL/uL — AB (ref 3.87–5.11)
RDW: 14.1 % (ref 11.5–15.5)
WBC: 10.6 10*3/uL — ABNORMAL HIGH (ref 4.0–10.5)

## 2016-05-29 MED ORDER — FENTANYL 40 MCG/ML IV SOLN
INTRAVENOUS | Status: DC
  Administered 2016-05-29: 543.1 ug via INTRAVENOUS
  Administered 2016-05-30: 250 ug via INTRAVENOUS
  Administered 2016-05-30: 150 ug via INTRAVENOUS
  Administered 2016-05-30: 18:00:00 via INTRAVENOUS
  Administered 2016-05-30 (×2): 250 ug via INTRAVENOUS
  Administered 2016-05-31: 40 ug via INTRAVENOUS
  Administered 2016-05-31: 100 ug via INTRAVENOUS
  Administered 2016-05-31: 367.2 ug via INTRAVENOUS
  Administered 2016-05-31: 200 ug via INTRAVENOUS
  Administered 2016-05-31: 250 ug via INTRAVENOUS
  Administered 2016-05-31: 437 ug via INTRAVENOUS
  Administered 2016-05-31: 300 ug via INTRAVENOUS
  Administered 2016-05-31: 250 ug via INTRAVENOUS
  Administered 2016-06-01: 451.3 ug via INTRAVENOUS
  Administered 2016-06-01: 163.6 ug via INTRAVENOUS
  Administered 2016-06-01: 50 ug via INTRAVENOUS
  Filled 2016-05-29 (×4): qty 25

## 2016-05-29 MED ORDER — IOPAMIDOL (ISOVUE-300) INJECTION 61%
100.0000 mL | Freq: Once | INTRAVENOUS | Status: AC | PRN
  Administered 2016-05-29: 100 mL via INTRAVENOUS

## 2016-05-29 NOTE — Progress Notes (Signed)
Daily Progress Note   Patient Name: Sheila Willis       Date: 05/29/2016 DOB: Apr 21, 1986  Age: 30 y.o. MRN#: [id number] Attending Physician: Bishop Limbo, MD Primary Care Physician: Lendon Colonel., MD Admit Date: 05/13/2016  Reason for Consultation/Follow-up: Pain control  Subjective: Doing much better. The klonopin is helping especially at night. Her PCA usage has decreased significantly. CT planned for tomorrow.   Length of Stay: 16  Current Medications: Scheduled Meds:  . acetaminophen  1,000 mg Oral TID  . bisacodyl  5 mg Oral QHS  . cefoTEtan (CEFOTAN) IV  2 g Intravenous Q12H  . doxycycline (VIBRAMYCIN) IV  100 mg Intravenous Q12H  . famotidine  20 mg Oral QHS  . feeding supplement (ENSURE ENLIVE)  237 mL Oral TID BM  . fentaNYL   Intravenous Q4H  . heparin subcutaneous  5,000 Units Subcutaneous Q8H  . lidocaine  2 patch Transdermal Q24H  . lip balm  1 application Topical BID  . mirabegron ER  50 mg Oral Daily  . saccharomyces boulardii  250 mg Oral BID  . URELLE  1 tablet Oral QID    Continuous Infusions: . dextrose 5 % and 0.9 % NaCl with KCl 20 mEq/L 50 mL/hr at 05/28/16 1716    PRN Meds: alum & mag hydroxide-simeth, clonazePAM, diphenhydrAMINE **OR** diphenhydrAMINE, hyoscyamine, magic mouthwash, menthol-cetylpyridinium, naloxone **AND** sodium chloride flush, ondansetron (ZOFRAN) IV, phenol, prochlorperazine, sodium chloride flush  Physical Exam          Vital Signs: BP 109/90 (BP Location: Left Arm)   Pulse 84   Temp 98.2 F (36.8 C) (Oral)   Resp 16   Ht 5\' 2"  (1.575 m)   Wt 58.1 kg (128 lb)   SpO2 100%   BMI 23.41 kg/m  SpO2: SpO2: 100 % O2 Device: O2 Device: Not Delivered O2 Flow Rate: O2 Flow Rate (L/min): 99 L/min  Intake/output summary:    Intake/Output Summary (Last 24 hours) at 05/29/16 1217 Last data filed at 05/29/16 0955  Gross per 24 hour  Intake          2233.33 ml  Output             2980 ml  Net          -746.67 ml   LBM: Last BM Date: 05/28/16  Baseline Weight: Weight: 58.1 kg (128 lb) Most recent weight: Weight: 58.1 kg (128 lb)       Palliative Assessment/Data:    Flowsheet Rows   Flowsheet Row Most Recent Value  Intake Tab  Referral Department  Surgery  Unit at Time of Referral  Med/Surg Unit  Palliative Care Primary Diagnosis  Pain  Date Notified  05/20/16  Palliative Care Type  New Palliative care  Reason for referral  Pain  Date of Admission  05/13/16  Date first seen by Palliative Care  05/20/16  # of days Palliative referral response time  0 Day(s)  # of days IP prior to Palliative referral  7  Clinical Assessment  Psychosocial & Spiritual Assessment  Palliative Care Outcomes      Patient Active Problem List   Diagnosis Date Noted  . 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 Anxiety state 05/27/2016  . Pain due to ureteral stent (HCC) 05/27/2016  . Pelvic abscess in female 05/15/2016  . Intra-abdominal abscess (HCC) 05/13/2016  . TOA (tubo-ovarian abscess) 04/19/2016  . Pelvic pain in female 04/18/2016  . PCOS (polycystic ovarian syndrome) 11/14/2012  . Gestational diabetes mellitus, class A1 11/14/2012  . SVD (spontaneous vaginal delivery) 11/13/2012  . Postpartum care following vaginal delivery (2/1) 11/13/2012    Palliative Care Assessment & Plan   Patient Profile: 30 yo with complex tubovarian abscess s/p extensive pelvic and abdominal surgery. Difficult to manage pain in setting of hydromorphone induced hyperalgesia, rapid opiate tolerance and psychosocial pressure leading to worsening pain and suffering.  Assessment: Palliative following for holistic support and pain management. Sheila Willis is dealing with serious complications following surgical removal of a  tuboovarian abscess, she has a complicated wound, colostomy and rectal drain. Significant pain and existential suffering. Complex home situation and life stress complicating this.   1. Hyperalgesia has resolved after opoid rotation. 2. Anticipatory Anxiety better on Klonopin. 3. Urelle helping with ureteral pain 4. Holding cymbalta until off urelle 5. Liquid stools, previously some opiate induced constipation  Recommendations/Plan:  Will continue to wean her off the Fentanyl PCA-should have her completely off in the next 1-2 days. May or may not need a transdermal patch.  She is requesting help with designating a HCPOA- wants her mother to be her surrogate decision maker in the event she cannot make decisions for herself.   Goals of Care and Additional Recommendations:  Limitations on Scope of Treatment: Full Scope Treatment  Code Status:    Code Status Orders        Start     Ordered   05/15/16 1548  Full code  Continuous     05/15/16 1547    Code Status History    Date Active Date Inactive Code Status Order ID Comments User Context   05/13/2016  3:27 PM 05/15/2016  3:47 PM Full Code [id number]  Sherrie GeorgeWillard Jennings, PA-C ED   04/18/2016  4:07 PM 05/01/2016  5:11 PM Full Code [id number]  Noland FordyceKelly Fogleman, MD Inpatient   11/14/2012 12:37 AM 11/15/2012  9:23 PM Full Code [id number]  Royann ShiversSusan Teeters Burns, RN Inpatient       Prognosis:   Unable to determine- full recovery expected  Discharge Planning:  To Be Determined  Care plan was discussed with patient and RN.  Thank you for allowing the Palliative Medicine Team to assist in the care of this patient.   Time In: 8PM Time Out: 835PM Total Time 35 min Prolonged Time Billed  no  Greater than 50%  of this time was spent counseling and coordinating care related to the above assessment and plan.  GOLDING,ELIZABETH, DO  Please contact Palliative Medicine Team phone at [phone] for questions and concerns.

## 2016-05-29 NOTE — Progress Notes (Signed)
[facility] when final IV ABX home regimen is determined and pt is clinically ready.  We will continue to follow to support transition home.  If patient discharges after hours, please call [phone].   Sedalia Mutaamela S Chandler 05/29/2016, 5:22 PM

## 2016-05-29 NOTE — Progress Notes (Signed)
[facility]    Date of Admission:  05/13/2016   Total days of antibiotics 17        Day 8 cefotetan/doxy          ID: Sheila Willis is a 30 y.o. female with complex, recurrent left tubo-ovarian abscess, and colocutaneous fistula POD#4 S/p ex lap with drainage of abscess and partial descending colectomy, appendectomy, , and partial R salpingectomy and colostomy placement and ureter stent placement Active Problems:   Intra-abdominal abscess ([place])   Pelvic abscess in female   Opioid-induced hyperalgesia (HCC)   Anxiety state   Pain due to ureteral stent ([place])    Subjective: She remains afebrile. Feels like she has still some pain in LLQ. Had repeat abd CT showing some improvement ( 1.0 x 4.2 cm on image 56/series 603 compared with 1.5 x 4.8 cm). She is alittle worried to when she gets home if infection gets worse  Objective: WBC at 10K trending down from 14K  Medications:  . acetaminophen  1,000 mg Oral TID  . bisacodyl  5 mg Oral QHS  . cefoTEtan (CEFOTAN) IV  2 g Intravenous Q12H  . doxycycline (VIBRAMYCIN) IV  100 mg Intravenous Q12H  . famotidine  20 mg Oral QHS  . feeding supplement (ENSURE ENLIVE)  237 mL Oral TID BM  . fentaNYL   Intravenous Q4H  . heparin subcutaneous  5,000 Units Subcutaneous Q8H  . lidocaine  2 patch Transdermal Q24H  . lip balm  1 application Topical BID  . mirabegron ER  50 mg Oral Daily  . saccharomyces boulardii  250 mg Oral BID  . URELLE  1 tablet Oral QID    Objective: Vital signs in last 24 hours: Temp:  [98.1 F (36.7 C)-99.1 F (37.3 C)] 98.6 F (37 C) (08/17 1436) Pulse Rate:  [84-104] 103 (08/17 1436) Resp:  [14-16] 15 (08/17 1436) BP: (99-109)/(60-90) 100/60 (08/17 1436) SpO2:  [98 %-100 %] 98 % (08/17 1436) Physical Exam  Constitutional:  oriented to person, place, and time. appears well-developed and well-nourished. Smiling at times during discussion HENT: [place]/AT, PERRLA, no scleral  icterus Mouth/Throat: Oropharynx is clear and moist. No oropharyngeal exudate.  Cardiovascular: regular rhythm and normal heart sounds. Exam reveals no gallop and no friction rub.  No murmur heard.  Pulmonary/Chest: Effort normal and breath sounds normal. No respiratory distress.  has no wheezes.  Abdominal: Soft. Bowel sounds are present. There is RLQ drain with serosanginous fluid in bulb. And ostomy in Left Quadrant. Midline has bandage covering laparotomy Lymphadenopathy: no cervical adenopathy. No axillary adenopathy Neurological: alert and oriented to person, place, and time.  Skin: Skin is warm and dry. No rash noted. No erythema.    Lab Results  Recent Labs  05/28/16 0400  WBC 10.8*  HGB 8.5*  HCT 25.8*   Microbiology: 8/1 blood cx ngtd  Historic micro: gram stain showed gpr and gnr  Escherichia coli    MIC    AMPICILLIN >=32 RESIST... Resistant    AMPICILLIN/SULBACTAM 16 INTERMED... Intermediate    CEFAZOLIN <=4 SENSITIVE "><=4 SENSITIVE  Sensitive    CEFEPIME <=1 SENSITIVE "><=1 SENSITIVE  Sensitive    CEFTAZIDIME <=1 SENSITIVE "><=1 SENSITIVE  Sensitive    CEFTRIAXONE <=1 SENSITIVE "><=1 SENSITIVE  Sensitive    CIPROFLOXACIN <=0.25 SENSITIVE "><=0.25 SENS... Sensitive    GENTAMICIN >=16 RESIST... Resistant    IMIPENEM <=0.25 SENSITIVE "><=0.25 SENS... Sensitive    PIP/TAZO <=4 SENSITIVE "><=4 SENSITIVE  Sensitive  TRIMETH/SULFA >=320 RESIS... Resistant         Susceptibility Comments   Escherichia coli  ABUNDANT ESCHERICHIA COLI    Specimen Collected: 04/21/16 10:59 Last Resulted: 04/26/16 15:23                      Encounter   View Encounter           Studies/Results: abd ct: Postoperative changes related to recent partial colectomy and left lower quadrant colostomy, as above. Small low-attenuation rim enhancing gas and fluid collection immediately deep to the healing anterior abdominal wound. This may simply represent a  resolving postoperative seroma, however, the possibility of a small abscess in this region is not excluded. No other well-defined intra abdominal abscess is noted. 2. However, there is tubular dilatation of a low-attenuation rim enhancing structure in the left adnexa, which is concerning for persistent left-sided tubo-ovarian abscess this is surrounded by inflammatory mass (likely phlegmon involving the left ovary/adenxal structures), which is exerting significant mass effect upon adjacent structures, including the distal third of the left ureter which is resulting in moderate left-sided hydroureteronephrosis which is new compared to the prior examination. 3. Small amount of gas non dependently in the lumen of the urinary bladder. This is likely iatrogenic if there has been recent indwelling Foley catheter (no Foley catheter is noted on today's examination). In the absence of a history of recent catheterization, correlation with urinalysis would be recommended to exclude urinary tract infection with gas-forming organisms. 4. Additional incidental findings, as above.  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 Continue with cefotetan and doxycyline. We will change doxy to oral so that she will only need to do Iv cefotetan Q12.(in addn to oral doxy). Will schedule doxy with meals since it can cause nausea. Length of abtx course will depend abit on repeat imaging and its improvement. Estimate she will need an additional 2-3 wk IV abtx. This has been discussed with patient. Plan to do iv abtx at home. Home health will need to give instructions to mother  Abdominal pain, post surgery = continue with management plan as recommended by Dr. Hilma Favors with ultimate plan to take  total fentanyl dosing to see if can transition to patch equivalent.   Anemia = hgb  at 8.6. stable    Home health orders listed below  Diagnosis: TOA  Culture Result: ecoli  Allergies  Allergen Reactions  . Latex Itching    Discharge antibiotics: Per pharmacy protocol cefotetan Aim for Vancomycin trough 15-20 (unless otherwise indicated) Duration: 3 weeks End Date: Sept 7th  [facility] Care Per Protocol:  Labs weekly while on IV antibiotics: x__ CBC with differential _x_ BMP __ CMP _x_ CRP _x_ ESR   Fax weekly labs to [phone]  Clinic Follow Up Appt: Dr Kaira Stringfield   @ 2wk  [place], [facility] Cell: [phone] Pager: [phone]  05/29/2016, 3:05 PM

## 2016-05-29 NOTE — Progress Notes (Signed)
Chaplain referred by palliative care for emotional support and assistance with advance directives.    Introduced spiritual care as resource.  Sheila Willis is in good spirits with mother and daughter at bedside.    Chaplain provided education around [facility] Power of [place].  Sheila Willis completed HCPOA.  Document notarized.  Originals and two copies with pt.  Copy placed in chart on unit.  Copy with medical records to be scanned into EPIC.    Sheila Willis understands spiritual care services are 24 hours.  We will continue to follow for support during admission.  Please page as needs arise. 3  [phone]   [name], [name] MDiv

## 2016-05-29 NOTE — Progress Notes (Signed)
4 Days Post-Op  Subjective: She is doing well.  Ostomy bag is full, and she is getting ready to change it.  She is smiling and more conversant than she has been in the past.  She is still very anxious about the wound, but making good progress.  Objective: Vital signs in last 24 hours: Temp:  [98.1 F (36.7 C)-99.1 F (37.3 C)] 98.2 F (36.8 C) (08/17 0955) Pulse Rate:  [84-104] 84 (08/17 0955) Resp:  [14-16] 16 (08/17 0955) BP: (99-109)/(61-90) 109/90 (08/17 0955) SpO2:  [98 %-100 %] 100 % (08/17 0955) Last BM Date: 05/28/16 720 by mouth 3550 urine Stool 325 Drain: 5 mL Afebrile vital signs are stable No labs today Repeat CT scan 05/29/16:  She has some sludge in her gallbladder without cholecystitis or biliary dilatation. Interval placement of a left ureteral stent resolution of the hydronephrosis. Surgical drain seen in the pelvis. Uterus is unremarkable tubular-shaped rim-enhancing fluid collection left adnexa resistance size from 1.54.8 down to 1.04.2 cm improved inflammatory changes within within the left adnexa and central pelvis show mild improvement no new or enlarging fluid collections. By CT this is still consistent with improving tubo-ovarian abscess. Intake/Output from previous day: 08/16 0701 - 08/17 0700 In: 2520 [P.O.:720; I.V.:1200; IV Piggyback:600] Out: 3880 [Urine:3550; Drains:5; Stool:325] Intake/Output this shift: Total I/O In: 120 [P.O.:120] Out: -   General appearance: alert, cooperative and no distress Resp: clear to auscultation bilaterally GI: soft, sore, site OK, ostomy working.    Lab Results:   Recent Labs  05/28/16 0400  WBC 10.8*  HGB 8.5*  HCT 25.8*  PLT 532*    BMET No results for input(s): NA, K, CL, CO2, GLUCOSE, BUN, CREATININE, CALCIUM in the last 72 hours. PT/INR No results for input(s): LABPROT, INR in the last 72 hours.   Recent Labs Lab 05/26/16 0900  AST 14*  ALT 6*  ALKPHOS 98  BILITOT 0.3  PROT 7.6  ALBUMIN 2.4*      Lipase     Component Value Date/Time   LIPASE 26 05/13/2016 0953     Studies/Results: Ct Abdomen Pelvis W Contrast  Result Date: 05/29/2016 CLINICAL DATA:  Followup abdominal and pelvic abscess and left hydronephrosis. EXAM: CT ABDOMEN AND PELVIS WITH CONTRAST TECHNIQUE: Multidetector CT imaging of the abdomen and pelvis was performed using the standard protocol following bolus administration of intravenous contrast. CONTRAST:  100mL ISOVUE-300 IOPAMIDOL (ISOVUE-300) INJECTION 61% COMPARISON:  05/22/2016 FINDINGS: Lower chest:  No acute findings. Hepatobiliary: No liver masses identified. Layering sludge again seen within the gallbladder, without evidence of cholecystitis or biliary dilatation. Pancreas: No mass, inflammatory changes, or other significant abnormality. Spleen: Within normal limits in size and appearance. Adrenals/Urinary Tract: There has been interval placement of a left ureteral stent in appropriate position. Resolution of left hydronephrosis since previous study. No evidence of renal masses. Normal adrenal glands. 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 No evidence of obstruction or bowel wall thickening. Vascular/Lymphatic: No pathologically enlarged lymph nodes. No evidence of abdominal aortic aneurysm. Reproductive: Surgical drain again seen within the pelvis. Uterus is unremarkable. A small tubular shape rim enhancing fluid collection in the left adnexa has mildly decreased in size, currently measuring 1.0 x 4.2 cm on image 56/series 603 compared with 1.5 x 4.8 cm previously. Adjacent inflammatory changes within the left adnexa and central pelvis show mild improvement. No new or enlarging fluid collections identified. Other: None. Musculoskeletal:  No suspicious bone lesions identified. IMPRESSION: Slight decrease in size of small tubular  rim enhancing fluid collection in left adnexa and adjacent inflammatory changes, consistent with improving tubo-ovarian  abscess. Resolution of left hydroureteronephrosis following left ureteral stent placement. Electronically Signed   By: Myles RosenthalJohn  Stahl M.D.   On: 05/29/2016 09:43    Medications: . acetaminophen  1,000 mg Oral TID  . bisacodyl  5 mg Oral QHS  . cefoTEtan (CEFOTAN) IV  2 g Intravenous Q12H  . doxycycline (VIBRAMYCIN) IV  100 mg Intravenous Q12H  . famotidine  20 mg Oral QHS  . feeding supplement (ENSURE ENLIVE)  237 mL Oral TID BM  . fentaNYL   Intravenous Q4H  . heparin subcutaneous  5,000 Units Subcutaneous Q8H  . lidocaine  2 patch Transdermal Q24H  . lip balm  1 application Topical BID  . mirabegron ER  50 mg Oral Daily  . saccharomyces boulardii  250 mg Oral BID  . 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 Cystoscopy and incidental appendectomy 05/15/16 Dr. Abbey Chattersosenbower POD 11 Left Hydronephrosis  S/p Cystoscopy with left retrograde pyelogram and interpretation. Insertion of left ureteral stent, 5 x 22 Polaris, no tether. 05/25/16 Dr. Sebastian Acheheodore Manny  Anemia - Jehovah witness - EPO Pain control -  DR. Golding Depression - better Malnutrition  Prealbumin 12.8 -  05/27/16 FEN:  Regular diet /IV fluids - decrease IV f ID: Day 10Zosyn 05/22/16, discontinued by ID. Cefotetan and doxycycline started 05/22/16 - Dr. Drue SecondSnider ID -day 8 DVT: Heparin/SCD  Plan:  Will review the CT with DR. Daphine DeutscherMartin, reheck labs in AM.  Await Dr. Feliz BeamSnider's input on antibiotics.  I told her she was getting toward the end of her stay here.  If we can get her on oral pain meds and get a plan on her antibiotics we can begin discharge planning.        LOS: 16 days    Rin Gorton 05/29/2016 [phone]

## 2016-05-30 LAB — CBC
HCT: 29.8 % — ABNORMAL LOW (ref 36.0–46.0)
HEMOGLOBIN: 9.8 g/dL — AB (ref 12.0–15.0)
MCH: 29.4 pg (ref 26.0–34.0)
MCHC: 32.9 g/dL (ref 30.0–36.0)
MCV: 89.5 fL (ref 78.0–100.0)
Platelets: 682 10*3/uL — ABNORMAL HIGH (ref 150–400)
RBC: 3.33 MIL/uL — AB (ref 3.87–5.11)
RDW: 14.4 % (ref 11.5–15.5)
WBC: 10.6 10*3/uL — ABNORMAL HIGH (ref 4.0–10.5)

## 2016-05-30 LAB — BASIC METABOLIC PANEL
ANION GAP: 7 (ref 5–15)
BUN: 14 mg/dL (ref 6–20)
CALCIUM: 9.4 mg/dL (ref 8.9–10.3)
CHLORIDE: 101 mmol/L (ref 101–111)
CO2: 27 mmol/L (ref 22–32)
Creatinine, Ser: 0.69 mg/dL (ref 0.44–1.00)
GFR calc Af Amer: >60 mL/min (ref >60)
GFR calc non Af Amer: >60 mL/min (ref >60)
GLUCOSE: 106 mg/dL — AB (ref 65–99)
POTASSIUM: 4 mmol/L (ref 3.5–5.1)
Sodium: 135 mmol/L (ref 135–145)

## 2016-05-30 MED ORDER — DOXYCYCLINE HYCLATE 100 MG PO TABS
100.0000 mg | ORAL_TABLET | Freq: Two times a day (BID) | ORAL | Status: DC
  Administered 2016-05-30 (×2): 100 mg via ORAL
  Filled 2016-05-30 (×2): qty 1

## 2016-05-30 MED ORDER — DOXYCYCLINE HYCLATE 100 MG IV SOLR
100.0000 mg | Freq: Two times a day (BID) | INTRAVENOUS | Status: DC
  Filled 2016-05-30: qty 100

## 2016-05-30 MED ORDER — DOXYCYCLINE HYCLATE 100 MG PO TABS
100.0000 mg | ORAL_TABLET | Freq: Two times a day (BID) | ORAL | Status: DC
  Filled 2016-05-30: qty 1

## 2016-05-30 NOTE — Progress Notes (Signed)
Nutrition Follow-up  DOCUMENTATION CODES:   Not applicable  INTERVENTION:  -Ensure Enlive po TID, each supplement provides 350 kcal and 20 grams of protein -RD to monitor   NUTRITION DIAGNOSIS:   Inadequate oral intake related to poor appetite, other (see comment) (Pain) as evidenced by per patient/family report. -resolving w/ advancement, ensure consumption  GOAL:   Patient will meet greater than or equal to 90% of their needs -progressing  MONITOR:   PO intake, I & O's, Labs, Weight trends, Supplement acceptance  REASON FOR ASSESSMENT:   Malnutrition Screening Tool    ASSESSMENT:   Patient reports increased abdominal pain starting on 05/11/16 pain is in the lower abdomen and her back some cramping and some vaginal bleeding. She has felt warm when sleeping but no measured fevers.  Sheila Willis was advanced to regular diet today. Appetite has varies, pt still experiences fluctuations in pain leading to poor appetite. Consuming 100% of ensure however. Did not eat much breakfast, for lunch, she had Mal[place]key, mac and cheese, and soup she consumed 75% of. No N/V Labs and medications reviewed: D5 @ 550mL/hr --> 204 calories  Diet Order:  Diet regular Room service appropriate? Yes; Fluid consistency: Thin  Skin:  Reviewed, no issues  Last BM:  8/1  Height:   Ht Readings from Last 1 Encounters:  05/13/16 5\' 2"  (1.575 m)    Weight:   Wt Readings from Last 1 Encounters:  05/13/16 128 lb (58.1 kg)    Ideal Body Weight:  50 kg  BMI:  Body mass index is 23.41 kg/m.  Estimated Nutritional Needs:   Kcal:  1500-1800 calories  Protein:  60-75 grams  Fluid:  >/= 1.5L  EDUCATION NEEDS:   No education needs identified at this time  Sheila AnoWilliam M. Heather Mckendree, MS, RD LDN Inpatient Clinical Dietitian Pager [phone]

## 2016-05-30 NOTE — Progress Notes (Deleted)
PHARMACIST - PHYSICIAN COMMUNICATION DR:   Drue SecondSnider CONCERNING: Antibiotic IV to Oral Route Change Policy  RECOMMENDATION: This patient is receiving doxycycline by the intravenous route.  Based on criteria approved by the Pharmacy and Therapeutics Committee, the antibiotic(s) is/are being converted to the equivalent oral dose form(s). Doxycycline 100 mg q12h  DESCRIPTION: These criteria include:  Patient being treated for a respiratory tract infection, urinary tract infection, cellulitis or clostridium difficile associated diarrhea if on metronidazole  The patient is not neutropenic and does not exhibit a GI malabsorption state  The patient is eating (either orally or via tube) and/or has been taking other orally administered medications for a least 24 hours  The patient is improving clinically and has a Tmax < 100.5  If you have questions about this conversion, please contact the Pharmacy Department  []   [phone]( [phone] )  [facility] []   [phone]( [phone] )  [facility] []   [phone]  [facility] []   [phone]  [facility] [x]   [phone]( [phone] )  [facility]

## 2016-05-30 NOTE — Progress Notes (Signed)
5 Days Post-Op  Subjective: She was up walking with her mother and her daughter this morning. CT shows good improvement. Her ostomy is functioning well. Dr. Ilsa IhaSnyder has made recommendations for discharge antibiotic therapy. Her biggest ongoing issue is IV pain control with fentanyl. If we can get her transitioned to oral/transdermal pain control, she can go home. Objective: Vital signs in last 24 hours: Temp:  [98.4 F (36.9 C)-99.1 F (37.3 C)] 99.1 F (37.3 C) (08/18 0647) Pulse Rate:  [88-103] 90 (08/18 0647) Resp:  [14-20] 14 (08/18 0647) BP: (95-106)/(57-74) 100/66 (08/18 0647) SpO2:  [98 %-100 %] 99 % (08/18 0647) Last BM Date: 05/29/16 480 by mouth recorded 300 from the ostomy Nothing recorded from the JP drain. MAXIMUM TEMPERATURE 99.1, vital signs are stable Labs this a.m. are normal CT report yesterday shows a slight decrease in the size in the small tubular rim-enhancing fluid collection the left adnexa and adjacent inflammatory changes consistent with improving tubo-ovarian abscess resolution of the hydroureteronephrosis left ureteral stent placement. Intake/Output from previous day: 08/17 0701 - 08/18 0700 In: 1530 [P.O.:480; I.V.:1000; IV Piggyback:50] Out: 2175 [Urine:1875; Stool:300] Intake/Output this shift: Total I/O In: 120 [P.O.:120] Out: -   General appearance: alert, cooperative, no distress and Still very anxious Resp: clear to auscultation bilaterally GI: Soft, sore, pain remains her primary issue at this point. The open wound is healing nicely. The ostomy is working well.  Lab Results:   Recent Labs  05/29/16 1550 05/30/16 0335  WBC 10.6* 10.6*  HGB 9.3* 9.8*  HCT 28.4* 29.8*  PLT 647* 682*    BMET  Recent Labs  05/29/16 1550 05/30/16 0420  NA 137 135  K 4.2 4.0  CL 99* 101  CO2 30 27  GLUCOSE 93 106*  BUN 13 14  CREATININE 0.77 0.69  CALCIUM 9.1 9.4   PT/INR No results for input(s): LABPROT, INR in the last 72 hours.   Recent  Labs Lab 05/26/16 0900 05/29/16 1550  AST 14* 14*  ALT 6* 7*  ALKPHOS 98 113  BILITOT 0.3 0.3  PROT 7.6 7.5  ALBUMIN 2.4* 2.4*     Lipase     Component Value Date/Time   LIPASE 26 05/13/2016 0953     Studies/Results: Ct Abdomen Pelvis W Contrast  Result Date: 05/29/2016 CLINICAL DATA:  Followup abdominal and pelvic abscess and left hydronephrosis. EXAM: CT ABDOMEN AND PELVIS WITH CONTRAST TECHNIQUE: Multidetector CT imaging of the abdomen and pelvis was performed using the standard protocol following bolus administration of intravenous contrast. CONTRAST:  100mL ISOVUE-300 IOPAMIDOL (ISOVUE-300) INJECTION 61% COMPARISON:  05/22/2016 FINDINGS: Lower chest:  No acute findings. Hepatobiliary: No liver masses identified. Layering sludge again seen within the gallbladder, without evidence of cholecystitis or biliary dilatation. Pancreas: No mass, inflammatory changes, or other significant abnormality. Spleen: Within normal limits in size and appearance. Adrenals/Urinary Tract: There has been interval placement of a left ureteral stent in appropriate position. Resolution of left hydronephrosis since previous study. No evidence of renal masses. Normal adrenal glands. 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 No evidence of obstruction or bowel wall thickening. Vascular/Lymphatic: No pathologically enlarged lymph nodes. No evidence of abdominal aortic aneurysm. Reproductive: Surgical drain again seen within the pelvis. Uterus is unremarkable. A small tubular shape rim enhancing fluid collection in the left adnexa has mildly decreased in size, currently measuring 1.0 x 4.2 cm on image 56/series 603 compared with 1.5 x 4.8 cm previously. Adjacent inflammatory changes within the left adnexa and central pelvis  show mild improvement. No new or enlarging fluid collections identified. Other: None. Musculoskeletal:  No suspicious bone lesions identified. IMPRESSION: Slight decrease in size of  small tubular rim enhancing fluid collection in left adnexa and adjacent inflammatory changes, consistent with improving tubo-ovarian abscess. Resolution of left hydroureteronephrosis following left ureteral stent placement. Electronically Signed   By: Myles RosenthalJohn  Stahl M.D.   On: 05/29/2016 09:43    Medications: . acetaminophen  1,000 mg Oral TID  . bisacodyl  5 mg Oral QHS  . cefoTEtan (CEFOTAN) IV  2 g Intravenous Q12H  . doxycycline (VIBRAMYCIN) IV  100 mg Intravenous Q12H  . famotidine  20 mg Oral QHS  . feeding supplement (ENSURE ENLIVE)  237 mL Oral TID BM  . fentaNYL   Intravenous Q4H  . heparin subcutaneous  5,000 Units Subcutaneous Q8H  . lidocaine  2 patch Transdermal Q24H  . lip balm  1 application Topical BID  . mirabegron ER  50 mg Oral Daily  . saccharomyces boulardii  250 mg Oral BID  . 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 Cystoscopy and incidental appendectomy 05/15/16 Dr. Abbey Chattersosenbower POD 11 Left Hydronephrosis  S/p Cystoscopy with left retrograde pyelogram and interpretation. Insertion of left ureteral stent, 5 x 22 Polaris, no tether. 05/25/16 Dr. Sebastian Acheheodore Manny  Anemia - Jehovah witness - EPO Pain control -  DR. Golding Depression - better Malnutrition Prealbumin 12.8 - 05/27/16 FEN: Regular diet/IV fluids - decrease IV f ID: Day [id number] 05/22/16, discontinued by ID. Cefotetan and doxycycline started 05/22/16 - Dr. Drue SecondSnider ID -day 9 DVT: Heparin/SCD    Plan: Home when we can get her off IV fentanyl and on to transdermal/oral pain control.    LOS: 17 days    Verina Galeno 05/30/2016 [phone]

## 2016-05-30 NOTE — Consult Note (Signed)
WOC Nurse ostomy follow up Stoma type/location: LLQ colostomy. Stomal assessment/size: 1 and 1/2 inches (exposed skin is covered with skin barrier ring) Peristomal assessment: intact, clear Treatment options for stomal/peristomal skin: Skin barrier ring Output brown/green stool Ostomy pouching: 2pc.2 and 1/4 inch pouching system with skin barrier ring  Education provided: Today mother observes sizing, traces pattern on back of skin barrier, cuts out skin barrier.  Applies skin barrier ring.  She is assisted with the skin barrier application and attached the pouch with minimal assistance.  Lock and Roll closure is performed by patient.  Supplies are replenished in the room.  Patient has 5 pouching "set ups" in her over sink cupboard. Provided with my contact information for trouble-shooting.  Reminded that [employer] will be contacting her post discharge for information about obtaining supplies. Enrolled patient in [facility] [employer] Discharge program: Yes WOC nursing team will follow, and will remain available to this patient, the nursing, surgical and medical teams.   Thanks, Ladona MowLaurie Maxmillian Carsey, MSN, RN, GNP, Hans EdenCWOCN, CWON-AP, FAAN  Pager# [phone]

## 2016-05-31 MED ORDER — FAMOTIDINE 20 MG PO TABS
20.0000 mg | ORAL_TABLET | Freq: Two times a day (BID) | ORAL | Status: DC
  Administered 2016-05-31 – 2016-06-05 (×10): 20 mg via ORAL
  Filled 2016-05-31 (×11): qty 1

## 2016-05-31 MED ORDER — METHOCARBAMOL 500 MG PO TABS
1000.0000 mg | ORAL_TABLET | Freq: Four times a day (QID) | ORAL | Status: DC
  Administered 2016-05-31 – 2016-06-04 (×15): 1000 mg via ORAL
  Filled 2016-05-31 (×16): qty 2

## 2016-05-31 MED ORDER — METOCLOPRAMIDE HCL 5 MG/ML IJ SOLN
5.0000 mg | Freq: Four times a day (QID) | INTRAMUSCULAR | Status: DC | PRN
  Administered 2016-05-31 – 2016-06-01 (×2): 10 mg via INTRAVENOUS
  Filled 2016-05-31 (×3): qty 2

## 2016-05-31 MED ORDER — POLYETHYLENE GLYCOL 3350 17 G PO PACK
17.0000 g | PACK | Freq: Two times a day (BID) | ORAL | Status: DC
  Administered 2016-05-31 – 2016-06-02 (×4): 17 g via ORAL
  Filled 2016-05-31 (×6): qty 1

## 2016-05-31 MED ORDER — ALUM & MAG HYDROXIDE-SIMETH 200-200-20 MG/5ML PO SUSP
30.0000 mL | Freq: Four times a day (QID) | ORAL | Status: DC | PRN
  Administered 2016-06-02: 30 mL via ORAL
  Filled 2016-05-31: qty 30

## 2016-05-31 MED ORDER — ENSURE ENLIVE PO LIQD
237.0000 mL | Freq: Two times a day (BID) | ORAL | Status: DC
  Administered 2016-05-31 – 2016-06-05 (×8): 237 mL via ORAL

## 2016-05-31 MED ORDER — METOPROLOL TARTRATE 5 MG/5ML IV SOLN: 5.0000 mg | Freq: Four times a day (QID) | INTRAVENOUS | Status: DC | PRN

## 2016-05-31 MED ORDER — ONDANSETRON 4 MG PO TBDP: 8.0000 mg | ORAL_TABLET | Freq: Two times a day (BID) | ORAL | Status: DC

## 2016-05-31 MED ORDER — LACTATED RINGERS IV BOLUS (SEPSIS): 1000.0000 mL | Freq: Three times a day (TID) | INTRAVENOUS | Status: AC | PRN

## 2016-05-31 MED ORDER — ACETAMINOPHEN 500 MG PO TABS
1000.0000 mg | ORAL_TABLET | Freq: Four times a day (QID) | ORAL | Status: DC
  Filled 2016-05-31: qty 2

## 2016-05-31 MED ORDER — DOXYCYCLINE HYCLATE 100 MG PO TABS
100.0000 mg | ORAL_TABLET | Freq: Two times a day (BID) | ORAL | Status: DC
  Administered 2016-05-31 – 2016-06-05 (×11): 100 mg via ORAL
  Filled 2016-05-31 (×12): qty 1

## 2016-05-31 MED ORDER — ONDANSETRON 4 MG PO TBDP
8.0000 mg | ORAL_TABLET | Freq: Two times a day (BID) | ORAL | Status: DC
  Administered 2016-05-31 – 2016-06-05 (×11): 8 mg via ORAL
  Filled 2016-05-31 (×12): qty 2

## 2016-05-31 MED ORDER — VITAMIN C 500 MG PO TABS
500.0000 mg | ORAL_TABLET | Freq: Two times a day (BID) | ORAL | Status: DC
  Administered 2016-05-31 – 2016-06-05 (×10): 500 mg via ORAL
  Filled 2016-05-31 (×11): qty 1

## 2016-05-31 MED ORDER — PROCHLORPERAZINE EDISYLATE 5 MG/ML IJ SOLN
5.0000 mg | INTRAMUSCULAR | Status: DC | PRN
  Administered 2016-06-03: 10 mg via INTRAVENOUS
  Administered 2016-06-03: 5 mg via INTRAVENOUS
  Administered 2016-06-04: 10 mg via INTRAVENOUS
  Filled 2016-05-31 (×3): qty 2

## 2016-05-31 MED ORDER — DIPHENHYDRAMINE HCL 50 MG/ML IJ SOLN: 12.5000 mg | Freq: Four times a day (QID) | INTRAMUSCULAR | Status: DC | PRN

## 2016-05-31 NOTE — Progress Notes (Addendum)
[address] [place], [place] [address] Phone: [phone] FAX: [phone]   Sheila Willis [id number] 12-13-1985  CARE TEAM:  PCP: Ala Dach., MD  Outpatient Care Team: Patient Care Team: Aloha Gell, MD as PCP - General (Obstetrics and Gynecology)  Inpatient Treatment Team: Treatment Team: Attending Provider: Nolon Nations, MD; Consulting Physician: Md Edison Pace, MD; Registered Nurse: Yvette Rack, RN; Technician: Sueanne Margarita, NT; Registered Nurse: Arnold Long, RN; Registered Nurse: Bailey Mech, RN; Technician: Merian Capron, NT; Consulting Physician: Kathie Rhodes, MD; Technician: Reginal Lutes, [place]; Registered Nurse: Oleta Mouse, RN; Registered Nurse: July D Aguilar, RN  Problem List:   Principal Problem:   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 Cystoscopy and incidental appendectomy 05/15/16 Dr. Zella Richer POD 11  Left Hydronephrosis  S/p Cystoscopy with left retrograde pyelogram and interpretation. Insertion of left ureteral stent, 5 x 22 Polaris, no tether. 05/25/16 Dr. Alexis Frock   Assessment  fair  Plan:   Anemia - Jehovah witness - EPO Pain control - DR. Golding weaning PCA.  Add robaxin Depression - better Malnutrition Prealbumin 12.8 - 05/27/16 FEN: HH diet with nausea.  Wean IV fluids ID: Zosyn x 10 d ,  discontinued by ID. Cefotetan and doxycycline started 05/22/16 - Dr. Baxter Flattery ID -day 9 -d/c drain - very low output & not within the fluid collection DVT: Heparin/SCD     Adin Hector, M.D., F.A.C.S. Gastrointestinal and Minimally Invasive Surgery [facility] [address], [place], [place] [address] [phone] Main / Paging   05/31/2016  Subjective:  Sore Nauseated Staying in bed  Objective:  Vital signs:  Vitals:   05/31/16 0000 05/31/16 0150 05/31/16 0400 05/31/16 0651  BP:  120/90  106/71  Pulse:  (!) 108  (!) 104  Resp: 14 16 16 18   Temp:  98.1 F (36.7 C)  99 F (37.2 C)  TempSrc:  Oral  Oral  SpO2: 100% 100% 100% 98%  Weight:      Height:        Last BM Date: 05/30/16  Intake/Output   Yesterday:  08/18 0701 - 08/19 0700 In: 600 [P.O.:600] Out: 1602 [Urine:700; Emesis/NG output:700; Drains:2; Stool:200] This shift:  No intake/output data recorded.  Bowel function:  Flatus: YES  BM:  YES  Drain: Bilious - scant   Physical Exam:  General: Pt awake/alert/oriented x4 in mild acute distress Eyes: PERRL, normal EOM.  Sclera clear.  No icterus Neuro: CN II-XII intact w/o focal sensory/motor deficits. Lymph: No head/neck/groin lymphadenopathy Psych:  No delerium/psychosis/paranoia HENT: Normocephalic, Mucus membranes moist.  No thrush Neck: Supple, No tracheal deviation Chest: No chest wall pain w good excursion CV:  Pulses intact.  Regular rhythm MS: Normal AROM mjr joints.  No obvious deformity Abdomen: Somewhat firm.  Nondistended.  Tenderness at midline..  No evidence of peritonitis.  No incarcerated hernias.  Dressing in place.  Colostomy pink with stool  Ext:  SCDs BLE.  No mjr edema.  No cyanosis Skin: No petechiae / purpura  Results:   Labs: Results for orders placed or performed during the hospital encounter of 05/13/16 (from the past 48 hour(s))  CBC     Status: Abnormal   Collection Time:  05/29/16  3:50 PM  Result Value Ref Range   WBC 10.6 (H) 4.0 - 10.5 K/uL   RBC 3.17 (L) 3.87 - 5.11 MIL/uL   Hemoglobin 9.3 (L) 12.0 - 15.0 g/dL   HCT 28.4 (L) 36.0 - 46.0 %   MCV 89.6 78.0 - 100.0 fL   MCH 29.3 26.0 - 34.0 pg   MCHC 32.7 30.0 - 36.0 g/dL   RDW 14.1 11.5 - 15.5 %   Platelets 647 (H) 150 - 400 K/uL  Comprehensive metabolic panel     Status: Abnormal   Collection Time: 05/29/16  3:50 PM  Result Value Ref Range   Sodium 137 135 - 145 mmol/L   Potassium 4.2 3.5 - 5.1 mmol/L   Chloride 99 (L) 101 - 111 mmol/L   CO2 30 22 - 32 mmol/L   Glucose, Bld 93 65 - 99 mg/dL   BUN 13 6 - 20 mg/dL   Creatinine, Ser 0.77 0.44 - 1.00 mg/dL   Calcium 9.1 8.9 - 10.3 mg/dL   Total Protein 7.5 6.5 - 8.1 g/dL   Albumin 2.4 (L) 3.5 - 5.0 g/dL   AST 14 (L) 15 - 41 U/L   ALT 7 (L) 14 - 54 U/L   Alkaline Phosphatase 113 38 - 126 U/L   Total Bilirubin 0.3 0.3 - 1.2 mg/dL   GFR calc non Af Amer >60 >60 mL/min   GFR calc Af Amer >60 >60 mL/min    Comment: (NOTE) The eGFR has been calculated using the CKD EPI equation. This calculation has not been validated in all clinical situations. eGFR's persistently <60 mL/min signify possible Chronic Kidney Disease.    Anion gap 8 5 - 15  CBC     Status: Abnormal   Collection Time: 05/30/16  3:35 AM  Result Value Ref Range   WBC 10.6 (H) 4.0 - 10.5 K/uL   RBC 3.33 (L) 3.87 - 5.11 MIL/uL   Hemoglobin 9.8 (L) 12.0 - 15.0 g/dL   HCT 29.8 (L) 36.0 - 46.0 %   MCV 89.5 78.0 - 100.0 fL   MCH 29.4 26.0 - 34.0 pg   MCHC 32.9 30.0 - 36.0 g/dL   RDW 14.4 11.5 - 15.5 %   Platelets 682 (H) 150 - 400 K/uL  Basic metabolic panel     Status: Abnormal   Collection Time: 05/30/16  4:20 AM  Result Value Ref Range   Sodium 135 135 - 145 mmol/L   Potassium 4.0 3.5 - 5.1 mmol/L   Chloride 101 101 - 111 mmol/L   CO2 27 22 - 32 mmol/L   Glucose, Bld 106 (H) 65 - 99 mg/dL   BUN 14 6 - 20 mg/dL   Creatinine, Ser 0.69 0.44 - 1.00 mg/dL   Calcium 9.4 8.9 -  10.3 mg/dL   GFR calc non Af Amer >60 >60 mL/min   GFR calc Af Amer >60 >60 mL/min    Comment: (NOTE) The eGFR has been calculated using the CKD EPI equation. This calculation has not been validated in all clinical situations. eGFR's persistently <60 mL/min signify possible Chronic Kidney Disease.    Anion gap 7 5 - 15    Imaging / Studies:  Ct Abdomen Pelvis W Contrast  Result Date: 05/29/2016 CLINICAL DATA:  Followup abdominal and pelvic abscess and left hydronephrosis. EXAM: CT ABDOMEN AND PELVIS WITH CONTRAST TECHNIQUE: Multidetector CT imaging of the abdomen and pelvis was performed using the standard protocol following bolus administration of intravenous contrast. CONTRAST:  119m ISOVUE-300 IOPAMIDOL (ISOVUE-300) INJECTION 61% COMPARISON:  05/22/2016 FINDINGS: Lower chest:  No acute findings. Hepatobiliary: No liver masses identified. Layering sludge again seen within the gallbladder, without evidence of cholecystitis or biliary dilatation. Pancreas: No mass, inflammatory changes, or other significant abnormality. Spleen: Within normal limits in size and appearance. Adrenals/Urinary Tract: There has been interval placement of a left ureteral stent in appropriate position. Resolution of left hydronephrosis since previous study. No evidence of renal masses. Normal adrenal glands. 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 No evidence of obstruction or bowel wall thickening. Vascular/Lymphatic: No pathologically enlarged lymph nodes. No evidence of abdominal aortic aneurysm. Reproductive: Surgical drain again seen within the pelvis. Uterus is unremarkable. A small tubular shape rim enhancing fluid collection in the left adnexa has mildly decreased in size, currently measuring 1.0 x 4.2 cm on image 56/series 603 compared with 1.5 x 4.8 cm previously. Adjacent inflammatory changes within the left adnexa and central pelvis show mild improvement. No new or enlarging fluid collections  identified. Other: None. Musculoskeletal:  No suspicious bone lesions identified. IMPRESSION: Slight decrease in size of small tubular rim enhancing fluid collection in left adnexa and adjacent inflammatory changes, consistent with improving tubo-ovarian abscess. Resolution of left hydroureteronephrosis following left ureteral stent placement. Electronically Signed   By: JEarle GellM.D.   On: 05/29/2016 09:43    Medications / Allergies: per chart  Antibiotics: Anti-infectives    Start     Dose/Rate Route Frequency Ordered Stop   05/30/16 2000  doxycycline (VIBRA-TABS) tablet 100 mg  Status:  Discontinued     100 mg Oral 2 times daily 05/30/16 1351 05/30/16 1353   05/30/16 1800  doxycycline (VIBRAMYCIN) 100 mg in dextrose 5 % 250 mL IVPB  Status:  Discontinued     100 mg 125 mL/hr over 120 Minutes Intravenous Every 12 hours 05/30/16 1353 05/30/16 1422   05/30/16 1800  doxycycline (VIBRA-TABS) tablet 100 mg     100 mg Oral 2 times daily after meals 05/30/16 1423     05/22/16 2200  cefoTEtan (CEFOTAN) 2 g in dextrose 5 % 50 mL IVPB     2 g 100 mL/hr over 30 Minutes Intravenous Every 12 hours 05/22/16 1702     05/22/16 1800  doxycycline (VIBRAMYCIN) 100 mg in dextrose 5 % 250 mL IVPB  Status:  Discontinued     100 mg 125 mL/hr over 120 Minutes Intravenous Every 12 hours 05/22/16 1702 05/30/16 1349   05/15/16 1600  piperacillin-tazobactam (ZOSYN) IVPB 3.375 g  Status:  Discontinued     3.375 g 12.5 mL/hr over 240 Minutes Intravenous Every 8 hours 05/15/16 1547 05/22/16 1701   05/15/16 0830  cefoTEtan (CEFOTAN) 2 g in dextrose 5 % 50 mL IVPB     2 g 100 mL/hr over 30 Minutes Intravenous On call to O.R. 05/14/16 0920 05/15/16 0945   05/14/16 0930  cefoTEtan (CEFOTAN) 2 g in dextrose 5 % 50 mL IVPB  Status:  Discontinued     2 g 100 mL/hr over 30 Minutes Intravenous On call to O.R. 05/14/16 0188408/02/17 0921   05/13/16 2200  vancomycin (VANCOCIN) IVPB 750 mg/150 ml premix  Status:   Discontinued  750 mg 150 mL/hr over 60 Minutes Intravenous Every 12 hours 05/13/16 1024 05/13/16 1529   05/13/16 1600  piperacillin-tazobactam (ZOSYN) IVPB 3.375 g  Status:  Discontinued     3.375 g 12.5 mL/hr over 240 Minutes Intravenous Every 8 hours 05/13/16 1024 05/15/16 1547   05/13/16 1015  vancomycin (VANCOCIN) IVPB 1000 mg/200 mL premix     1,000 mg 200 mL/hr over 60 Minutes Intravenous  Once 05/13/16 1006 05/13/16 1233   05/13/16 1015  piperacillin-tazobactam (ZOSYN) IVPB 3.375 g     3.375 g 100 mL/hr over 30 Minutes Intravenous  Once 05/13/16 1006 05/13/16 1104        Note: Portions of this report may have been transcribed using voice recognition software. Every effort was made to ensure accuracy; however, inadvertent computerized transcription errors may be present.   Any transcriptional errors that result from this process are unintentional.     Adin Hector, M.D., F.A.C.S. Gastrointestinal and Minimally Invasive Surgery [facility] [address], [place], [place] [address] [phone] Main / Paging   05/31/2016

## 2016-05-31 NOTE — Progress Notes (Signed)
[facility]    Date of Admission:  05/13/2016   Total days of antibiotics 19        Day 10 cefotetan/doxy          ID: Sheila Willis is a 30 y.o. female with complex, recurrent left tubo-ovarian abscess, and colocutaneous fistula POD#4 S/p ex lap with drainage of abscess and partial descending colectomy, appendectomy, , and partial R salpingectomy and colostomy placement and ureter stent placement Active Problems:   Intra-abdominal abscess (HCC)   Pelvic abscess in female   Opioid-induced hyperalgesia (HCC)   Anxiety state   Pain due to ureteral stent (HCC)    Subjective: She remains afebrile. But reported having nausea  Medications:  . acetaminophen  1,000 mg Oral Q6H  . cefoTEtan (CEFOTAN) IV  2 g Intravenous Q12H  . doxycycline  100 mg Oral BID WC   And  . ondansetron  8 mg Oral BID  . famotidine  20 mg Oral BID  . feeding supplement (ENSURE ENLIVE)  237 mL Oral BID BM  . fentaNYL   Intravenous Q4H  . heparin subcutaneous  5,000 Units Subcutaneous Q8H  . lidocaine  2 patch Transdermal Q24H  . lip balm  1 application Topical BID  . methocarbamol  1,000 mg Oral QID  . mirabegron ER  50 mg Oral Daily  . polyethylene glycol  17 g Oral BID  . saccharomyces boulardii  250 mg Oral BID  . URELLE  1 tablet Oral QID  . vitamin C  500 mg Oral BID    Lab Results  Recent Labs  05/29/16 1550 05/30/16 0335 05/30/16 0420  WBC 10.6* 10.6*  --   HGB 9.3* 9.8*  --   HCT 28.4* 29.8*  --   NA 137  --  135  K 4.2  --  4.0  CL 99*  --  101  CO2 30  --  27  BUN 13  --  14  CREATININE 0.77  --  0.69   Microbiology: 8/1 blood cx ngtd  Historic micro: gram stain showed gpr and gnr  Escherichia coli    MIC    AMPICILLIN >=32 RESIST... Resistant    AMPICILLIN/SULBACTAM 16 INTERMED... Intermediate    CEFAZOLIN <=4 SENSITIVE "><=4 SENSITIVE  Sensitive    CEFEPIME <=1 SENSITIVE "><=1 SENSITIVE  Sensitive    CEFTAZIDIME <=1 SENSITIVE "><=1 SENSITIVE   Sensitive    CEFTRIAXONE <=1 SENSITIVE "><=1 SENSITIVE  Sensitive    CIPROFLOXACIN <=0.25 SENSITIVE "><=0.25 SENS... Sensitive    GENTAMICIN >=16 RESIST... Resistant    IMIPENEM <=0.25 SENSITIVE "><=0.25 SENS... Sensitive    PIP/TAZO <=4 SENSITIVE "><=4 SENSITIVE  Sensitive    TRIMETH/SULFA >=320 RESIS... Resistant         Susceptibility Comments   Escherichia coli  ABUNDANT ESCHERICHIA COLI    Specimen Collected: 04/21/16 10:59 Last Resulted: 04/26/16 15:23                      Encounter   View Encounter           Studies/Results: abd ct: Postoperative changes related to recent partial colectomy and left lower quadrant colostomy, as above. Small low-attenuation rim enhancing gas and fluid collection immediately deep to the healing anterior abdominal wound. This may simply represent a resolving postoperative seroma, however, the possibility of a small abscess in this region is not excluded. No other well-defined intra abdominal abscess is noted. 2. However, there is tubular  dilatation of a low-attenuation rim enhancing structure in the left adnexa, which is concerning for persistent left-sided tubo-ovarian abscess this is surrounded by inflammatory mass (likely phlegmon involving the left ovary/adenxal structures), which is exerting significant mass effect upon adjacent structures, including the distal third of the left ureter which is resulting in moderate left-sided hydroureteronephrosis which is new compared to the prior examination. 3. Small amount of gas non dependently in the lumen of the urinary bladder. This is likely iatrogenic if there has been recent indwelling Foley catheter (no Foley catheter is noted on today's examination). In the absence of a history of recent catheterization, correlation with urinalysis would be recommended to exclude urinary tract infection with gas-forming organisms. 4. Additional incidental findings, as  above.  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   - Continue with cefotetan and doxycyline. Length of abtx course will depend abit on repeat imaging and its improvement. Estimate she will need an additional 2-3 wk IV abtx. This has been discussed with patient. Plan to do iv abtx at home.   - nausea = possibly due to oral doxycycline. We will schedule zofran 30min prior to doxy to see if that helps.  Abdominal pain, post surgery = continue with management plan as recommended by Dr. Phillips OdorGolding with ultimate plan to take  total fentanyl dosing to see if can transition to patch equivalent.    Drue SecondSNIDER, [facility] Cell: [phone] Pager: [phone]  05/31/2016, 3:18 PM

## 2016-06-01 ENCOUNTER — Encounter (HOSPITAL_COMMUNITY): Payer: Self-pay | Admitting: Surgery

## 2016-06-01 DIAGNOSIS — R110 Nausea: Secondary | ICD-10-CM

## 2016-06-01 DIAGNOSIS — N136 Pyonephrosis: Secondary | ICD-10-CM

## 2016-06-01 DIAGNOSIS — Z933 Colostomy status: Secondary | ICD-10-CM

## 2016-06-01 DIAGNOSIS — K632 Fistula of intestine: Secondary | ICD-10-CM

## 2016-06-01 LAB — CBC
HCT: 32.4 % — ABNORMAL LOW (ref 36.0–46.0)
Hemoglobin: 10.6 g/dL — ABNORMAL LOW (ref 12.0–15.0)
MCH: 29.1 pg (ref 26.0–34.0)
MCHC: 32.7 g/dL (ref 30.0–36.0)
MCV: 89 fL (ref 78.0–100.0)
PLATELETS: 542 10*3/uL — AB (ref 150–400)
RBC: 3.64 MIL/uL — AB (ref 3.87–5.11)
RDW: 14.8 % (ref 11.5–15.5)
WBC: 15.7 10*3/uL — AB (ref 4.0–10.5)

## 2016-06-01 LAB — COMPREHENSIVE METABOLIC PANEL
ALBUMIN: 2.8 g/dL — AB (ref 3.5–5.0)
ALT: 8 U/L — AB (ref 14–54)
AST: 18 U/L (ref 15–41)
Alkaline Phosphatase: 99 U/L (ref 38–126)
Anion gap: 9 (ref 5–15)
BUN: 15 mg/dL (ref 6–20)
CHLORIDE: 103 mmol/L (ref 101–111)
CO2: 25 mmol/L (ref 22–32)
CREATININE: 1.01 mg/dL — AB (ref 0.44–1.00)
Calcium: 10 mg/dL (ref 8.9–10.3)
GFR calc Af Amer: >60 mL/min (ref >60)
GLUCOSE: 98 mg/dL (ref 65–99)
Potassium: 4.1 mmol/L (ref 3.5–5.1)
SODIUM: 137 mmol/L (ref 135–145)
Total Bilirubin: 0.2 mg/dL — ABNORMAL LOW (ref 0.3–1.2)
Total Protein: 8.2 g/dL — ABNORMAL HIGH (ref 6.5–8.1)

## 2016-06-01 LAB — GLUCOSE, CAPILLARY: GLUCOSE-CAPILLARY: 164 mg/dL — AB (ref 65–99)

## 2016-06-01 MED ORDER — LACTATED RINGERS IV BOLUS (SEPSIS): 1000.0000 mL | Freq: Three times a day (TID) | INTRAVENOUS | Status: AC | PRN

## 2016-06-01 MED ORDER — FENTANYL 50 MCG/HR TD PT72
75.0000 ug | MEDICATED_PATCH | TRANSDERMAL | Status: DC
  Administered 2016-06-01: 75 ug via TRANSDERMAL
  Filled 2016-06-01: qty 1

## 2016-06-01 MED ORDER — SODIUM CHLORIDE 0.9% FLUSH: 3.0000 mL | Freq: Two times a day (BID) | INTRAVENOUS | Status: DC

## 2016-06-01 MED ORDER — NAPROXEN 500 MG PO TABS
500.0000 mg | ORAL_TABLET | Freq: Two times a day (BID) | ORAL | Status: DC
  Administered 2016-06-01 – 2016-06-02 (×3): 500 mg via ORAL
  Filled 2016-06-01 (×3): qty 1

## 2016-06-01 MED ORDER — SODIUM CHLORIDE 0.9 % IV SOLN: 250.0000 mL | INTRAVENOUS | Status: DC | PRN

## 2016-06-01 MED ORDER — ACETAMINOPHEN 500 MG PO TABS: 500.0000 mg | ORAL_TABLET | Freq: Four times a day (QID) | ORAL | Status: DC | PRN

## 2016-06-01 MED ORDER — SODIUM CHLORIDE 0.9% FLUSH: 3.0000 mL | INTRAVENOUS | Status: DC | PRN

## 2016-06-01 MED ORDER — FENTANYL CITRATE (PF) 100 MCG/2ML IJ SOLN
25.0000 ug | INTRAMUSCULAR | Status: DC | PRN
  Administered 2016-06-01 – 2016-06-02 (×8): 50 ug via INTRAVENOUS
  Filled 2016-06-01 (×8): qty 2

## 2016-06-01 NOTE — Progress Notes (Signed)
Patient passed out in shower this AM around 0930. Patient was showering per MD order.   Patient left with mother in bathroom with patient.   Patients mother called out saying she needed help.   Tamera ReasonPauline Dancy, NT responded to call and went to patients room.   Patient found passed out and in mothers arms. Feet still on floor.   Maralyn SagoSarah, RN then responded to call from [place], [place].  Maralyn SagoSarah arrived to room, and saw patient was grey and not responsive. RN lowered patient to bathroom floor, and patient immediately began to wake up after pulse check and sternal rub.   Patient was found to have a pulse, and was able to tell me her name, where she was, and who I was.   Vital signs were taken at 0943. BP 105/63, HR 111, 100% on room air.   Patient helped to standing position with RN and NT. We then helped patient back to bed.   Mother completed colostomy bag placement, and midline dressing change after patient was back to bed.   RN remained in patients room until 1015, then went to page the MD.   MD paged, orders were written for lab work, and an EKG. All were obtained.   Toth III, rounded on patient after incident, and said it was most likely a vagal response.   Safety Zone Portal completed this afternoon. All necessary staff members also informed.

## 2016-06-01 NOTE — Progress Notes (Signed)
[facility]    Date of Admission:  05/13/2016   Total days of antibiotics 20        Day 11 cefotetan/doxy          ID: Sheila Willis is a 30 y.o. 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 She had syncopal event when she showered today. She states that she had nausea then onset of room spinning. Her mother was able to help her not have any injury. Now feeling better. She is off of pca transitioned to patch but needing occasional iv push.   Medications:  . cefoTEtan (CEFOTAN) IV  2 g Intravenous Q12H  . doxycycline  100 mg Oral BID WC   And  . ondansetron  8 mg Oral BID  . famotidine  20 mg Oral BID  . feeding supplement (ENSURE ENLIVE)  237 mL Oral BID BM  . fentaNYL  75 mcg Transdermal Q72H  . heparin subcutaneous  5,000 Units Subcutaneous Q8H  . lidocaine  2 patch Transdermal Q24H  . lip balm  1 application Topical BID  . methocarbamol  1,000 mg Oral QID  . mirabegron ER  50 mg Oral Daily  . naproxen  500 mg Oral BID WC  . polyethylene glycol  17 g Oral BID  . saccharomyces boulardii  250 mg Oral BID  . sodium chloride flush  3 mL Intravenous Q12H  . URELLE  1 tablet Oral QID  . vitamin C  500 mg Oral BID   Objective: BP 108/78 (BP Location: Left Arm)   Pulse 96   Temp 98.5 F (36.9 C) (Oral)   Resp 16   Ht 5\' 2"  (1.575 m)   Wt 128 lb  (58.1 kg)   SpO2 100%   BMI 23.41 kg/m   Physical Exam  Constitutional:  oriented to person, place, and time. appears well-developed and well-nourished. Smiling at times during discussion HENT: [place]/AT, PERRLA, no scleral icterus Mouth/Throat: Oropharynx is clear and moist. No oropharyngeal exudate.  Cardiovascular: regular rhythm and normal heart sounds. Exam reveals no gallop and no friction rub.  No murmur heard.  Pulmonary/Chest: Effort normal and breath sounds normal. No respiratory distress.  has no wheezes.  Abdominal: Soft. Bowel sounds are present.  ostomy in Left Quadrant. Midline has bandage covering laparotomy Lymphadenopathy: no cervical adenopathy. No axillary adenopathy Neurological: alert and oriented to person, place, and time.  Skin: Skin is warm and dry. No rash noted. No erythema.    Lab Results  Recent Labs  05/30/16 0335 05/30/16 0420 06/01/16 1045  WBC 10.6*  --  15.7*  HGB 9.8*  --  10.6*  HCT 29.8*  --  32.4*  NA  --  135 137  K  --  4.0 4.1  CL  --  101 103  CO2  --  27 25  BUN  --  14 15  CREATININE  --  0.69 1.01*   Microbiology:  8/1 blood cx ngtd  Historic micro: gram stain showed gpr and gnr  Escherichia coli    MIC    AMPICILLIN >=32 RESIST... Resistant    AMPICILLIN/SULBACTAM 16 INTERMED... Intermediate    CEFAZOLIN <=4 SENSITIVE "><=4 SENSITIVE  Sensitive    CEFEPIME <=1 SENSITIVE "><=1 SENSITIVE  Sensitive    CEFTAZIDIME <=1 SENSITIVE "><=1 SENSITIVE  Sensitive    CEFTRIAXONE <=1 SENSITIVE "><=1 SENSITIVE  Sensitive    CIPROFLOXACIN <=0.25 SENSITIVE "><=0.25 SENS... Sensitive    GENTAMICIN >=16 RESIST... Resistant    IMIPENEM <=0.25 SENSITIVE "><=0.25 SENS... Sensitive    PIP/TAZO <=4 SENSITIVE "><=4 SENSITIVE  Sensitive    TRIMETH/SULFA >=320 RESIS... Resistant         Susceptibility Comments   Escherichia coli  ABUNDANT ESCHERICHIA COLI    Specimen Collected: 04/21/16 10:59 Last Resulted: 04/26/16 15:23                        Encounter   View Encounter           Studies/Results: abd ct: Postoperative changes related to recent partial colectomy and left lower quadrant colostomy, as above. Small low-attenuation rim enhancing gas and fluid collection immediately deep to the healing anterior abdominal wound. This may simply represent a resolving postoperative seroma, however, the possibility of a small abscess in this region is not excluded. No other well-defined intra abdominal abscess is noted. 2. However, there is tubular dilatation of a low-attenuation rim enhancing structure in the left adnexa, which is concerning for persistent left-sided tubo-ovarian abscess this is surrounded by inflammatory mass (likely phlegmon involving the left ovary/adenxal structures), which is exerting significant mass effect upon adjacent structures, including the distal third of the left ureter which is resulting in moderate left-sided hydroureteronephrosis which is new compared to the prior examination. 3. Small amount of gas non dependently in the lumen of the urinary bladder. This is likely iatrogenic if there has been recent indwelling Foley catheter (no Foley catheter is noted on today's examination). In the absence of a history of recent catheterization, correlation with urinalysis would be recommended to exclude urinary tract infection with gas-forming organisms. 4. Additional incidental findings, as above.  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   - Continue with cefotetan and doxycyline. Length of abtx course will depend abit on repeat imaging and its improvement. Estimate she will need an additional 2 wk IV abtx. Plan to do iv abtx at home.   - nausea = improved with scheduled zofran 30min prior to doxy q 12  - leukocytosis = increased today though she  physically looks better. Platelets improved also another sign of acute phase reactant. Continue to monitor cbc tomorrow, maybe isolated increase. Will trend out  Abdominal pain, post surgery = continues to transition to oral/patch equivalents   Drue SecondSNIDER, [facility] Cell: [phone] Pager: [phone]  06/01/2016, 4:22 PM

## 2016-06-01 NOTE — Progress Notes (Signed)
7 Days Post-Op  Subjective: Passed out in bathroom this am but did not fall. Mom caught her  Objective: Vital signs in last 24 hours: Temp:  [98.2 F (36.8 C)-98.6 F (37 C)] 98.5 F (36.9 C) (08/20 0429) Pulse Rate:  [88-118] 111 (08/20 0943) Resp:  [15-18] 15 (08/20 0429) BP: (97-105)/(62-73) 105/63 (08/20 0943) SpO2:  [99 %-100 %] 100 % (08/20 0943) Last BM Date:  (colostomy working)  Intake/Output from previous day: 08/19 0701 - 08/20 0700 In: 1490 [P.O.:1320; I.V.:120; IV Piggyback:50] Out: 900 [Urine:900] Intake/Output this shift: Total I/O In: 120 [P.O.:120] Out: 1 [Stool:1]  Resp: clear to auscultation bilaterally Cardio: regular rate and rhythm GI: soft, minimal tenderness. ostomy pink. no distension  Lab Results:   Recent Labs  05/30/16 0335 06/01/16 1045  WBC 10.6* 15.7*  HGB 9.8* 10.6*  HCT 29.8* 32.4*  PLT 682* 542*   BMET  Recent Labs  05/29/16 1550 05/30/16 0420  NA 137 135  K 4.2 4.0  CL 99* 101  CO2 30 27  GLUCOSE 93 106*  BUN 13 14  CREATININE 0.77 0.69  CALCIUM 9.1 9.4   PT/INR No results for input(s): LABPROT, INR in the last 72 hours. ABG No results for input(s): PHART, HCO3 in the last 72 hours.  Invalid input(s): PCO2, PO2  Studies/Results: No results found.  Anti-infectives: Anti-infectives    Start     Dose/Rate Route Frequency Ordered Stop   05/31/16 1015  doxycycline (VIBRA-TABS) tablet 100 mg     100 mg Oral 2 times daily with meals 05/31/16 1012     05/30/16 2000  doxycycline (VIBRA-TABS) tablet 100 mg  Status:  Discontinued     100 mg Oral 2 times daily 05/30/16 1351 05/30/16 1353   05/30/16 1800  doxycycline (VIBRAMYCIN) 100 mg in dextrose 5 % 250 mL IVPB  Status:  Discontinued     100 mg 125 mL/hr over 120 Minutes Intravenous Every 12 hours 05/30/16 1353 05/30/16 1422   05/30/16 1800  doxycycline (VIBRA-TABS) tablet 100 mg  Status:  Discontinued     100 mg Oral 2 times daily after meals 05/30/16 1423 05/31/16  1012   05/22/16 2200  cefoTEtan (CEFOTAN) 2 g in dextrose 5 % 50 mL IVPB     2 g 100 mL/hr over 30 Minutes Intravenous Every 12 hours 05/22/16 1702     05/22/16 1800  doxycycline (VIBRAMYCIN) 100 mg in dextrose 5 % 250 mL IVPB  Status:  Discontinued     100 mg 125 mL/hr over 120 Minutes Intravenous Every 12 hours 05/22/16 1702 05/30/16 1349   05/15/16 1600  piperacillin-tazobactam (ZOSYN) IVPB 3.375 g  Status:  Discontinued     3.375 g 12.5 mL/hr over 240 Minutes Intravenous Every 8 hours 05/15/16 1547 05/22/16 1701   05/15/16 0830  cefoTEtan (CEFOTAN) 2 g in dextrose 5 % 50 mL IVPB     2 g 100 mL/hr over 30 Minutes Intravenous On call to O.R. 05/14/16 0920 05/15/16 0945   05/14/16 0930  cefoTEtan (CEFOTAN) 2 g in dextrose 5 % 50 mL IVPB  Status:  Discontinued     2 g 100 mL/hr over 30 Minutes Intravenous On call to O.R. 05/14/16 [id number] 05/14/16 0921   05/13/16 2200  vancomycin (VANCOCIN) IVPB 750 mg/150 ml premix  Status:  Discontinued     750 mg 150 mL/hr over 60 Minutes Intravenous Every 12 hours 05/13/16 1024 05/13/16 1529   05/13/16 1600  piperacillin-tazobactam (ZOSYN) IVPB 3.375 g  Status:  Discontinued     3.375 g 12.5 mL/hr over 240 Minutes Intravenous Every 8 hours 05/13/16 1024 05/15/16 1547   05/13/16 1015  vancomycin (VANCOCIN) IVPB 1000 mg/200 mL premix     1,000 mg 200 mL/hr over 60 Minutes Intravenous  Once 05/13/16 1006 05/13/16 1233   05/13/16 1015  piperacillin-tazobactam (ZOSYN) IVPB 3.375 g     3.375 g 100 mL/hr over 30 Minutes Intravenous  Once 05/13/16 1006 05/13/16 1104      Assessment/Plan: s/p Procedure(s): CYSTOSCOPY WITH RETROGRADE PYELOGRAM/LEFT URETERAL STENT PLACEMENT (Left) Advance diet. Continue with regular diet if nausea improves Syncope. EKG looks good. Hg stable. Will transfer to monitor bed Wbc up today after syncopal episode. Continue cefotan and doxy. Last CT was 3 days ago and was improving S/p partial colectomy for pelvic abscess Ureteral  stent  LOS: 19 days    TOTH III,Devonne Kitchen S 06/01/2016

## 2016-06-02 MED ORDER — POLYETHYLENE GLYCOL 3350 17 G PO PACK
17.0000 g | PACK | Freq: Every day | ORAL | Status: DC | PRN
  Administered 2016-06-03: 17 g via ORAL
  Filled 2016-06-02: qty 1

## 2016-06-02 MED ORDER — FENTANYL CITRATE (PF) 100 MCG/2ML IJ SOLN
25.0000 ug | INTRAMUSCULAR | Status: DC | PRN
  Administered 2016-06-02 – 2016-06-03 (×6): 25 ug via INTRAVENOUS
  Filled 2016-06-02 (×6): qty 2

## 2016-06-02 MED ORDER — SODIUM CHLORIDE 0.9 % IV BOLUS (SEPSIS)
1000.0000 mL | Freq: Once | INTRAVENOUS | Status: AC
  Administered 2016-06-02: 1000 mL via INTRAVENOUS

## 2016-06-02 MED ORDER — OXYCODONE-ACETAMINOPHEN 5-325 MG PO TABS
1.0000 | ORAL_TABLET | ORAL | Status: DC | PRN
  Administered 2016-06-02: 1 via ORAL
  Administered 2016-06-02 – 2016-06-03 (×5): 2 via ORAL
  Filled 2016-06-02 (×5): qty 2
  Filled 2016-06-02: qty 1
  Filled 2016-06-02 (×3): qty 2

## 2016-06-02 MED ORDER — FENTANYL 25 MCG/HR TD PT72
25.0000 ug | MEDICATED_PATCH | TRANSDERMAL | Status: DC
  Administered 2016-06-02: 25 ug via TRANSDERMAL
  Filled 2016-06-02: qty 1

## 2016-06-02 NOTE — Progress Notes (Signed)
Daily Progress Note   Patient Name: Sheila Willis       Date: 06/02/2016 DOB: 29-May-1986  Age: 30 y.o. MRN#: [id number] Attending Physician: Bishop LimboMd Ccs, MD Primary Care Physician: Lendon ColonelFOGLEMAN,KELLY A., MD Admit Date: 05/13/2016  Reason for Consultation/Follow-up: Pain control  Subjective: Note syncopal event yesterday. She seems more fatigued and weak today. Pain is about the same- doing ok with conversion to TD.   Length of Stay: 20  Current Medications: Scheduled Meds:  . cefoTEtan (CEFOTAN) IV  2 g Intravenous Q12H  . doxycycline  100 mg Oral BID WC   And  . ondansetron  8 mg Oral BID  . famotidine  20 mg Oral BID  . feeding supplement (ENSURE ENLIVE)  237 mL Oral BID BM  . fentaNYL  25 mcg Transdermal Q72H  . heparin subcutaneous  5,000 Units Subcutaneous Q8H  . lidocaine  2 patch Transdermal Q24H  . lip balm  1 application Topical BID  . methocarbamol  1,000 mg Oral QID  . mirabegron ER  50 mg Oral Daily  . saccharomyces boulardii  250 mg Oral BID  . sodium chloride  1,000 mL Intravenous Once  . URELLE  1 tablet Oral QID  . vitamin C  500 mg Oral BID    Continuous Infusions:    PRN Meds: acetaminophen, alum & mag hydroxide-simeth, clonazePAM, diphenhydrAMINE **OR** diphenhydrAMINE, diphenhydrAMINE, fentaNYL (SUBLIMAZE) injection, lactated ringers, magic mouthwash, menthol-cetylpyridinium, metoCLOPramide (REGLAN) injection, metoprolol, oxyCODONE-acetaminophen, phenol, polyethylene glycol, prochlorperazine, sodium chloride flush  Physical Exam          Vital Signs: BP 108/75 (BP Location: Left Arm)   Pulse 91   Temp 98.1 F (36.7 C) (Oral)   Resp 14   Ht 5\' 2"  (1.575 m)   Wt 58.1 kg (128 lb)   SpO2 100%   BMI 23.41 kg/m  SpO2: SpO2: 100 % O2 Device: O2 Device: Not  Delivered O2 Flow Rate: O2 Flow Rate (L/min): 0 L/min  Intake/output summary:  Intake/Output Summary (Last 24 hours) at 06/02/16 1132 Last data filed at 06/02/16 0512  Gross per 24 hour  Intake              960 ml  Output              700 ml  Net  260 ml   LBM: Last BM Date: 06/02/16 Baseline Weight: Weight: 58.1 kg (128 lb) Most recent weight: Weight: 58.1 kg (128 lb)       Palliative Assessment/Data:  Flowsheet Rows   Flowsheet Row Most Recent Value  Intake Tab  Referral Department  Surgery  Unit at Time of Referral  Med/Surg Unit  Palliative Care Primary Diagnosis  Pain  Date Notified  05/20/16  Palliative Care Type  New Palliative care  Reason for referral  Pain  Date of Admission  05/13/16  Date first seen by Palliative Care  05/20/16  # of days Palliative referral response time  0 Day(s)  # of days IP prior to Palliative referral  7  Clinical Assessment  Psychosocial & Spiritual Assessment  Palliative Care Outcomes      Patient Active Problem List   Diagnosis Date Noted  . 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 Colostomy in place [facility](HCC) 06/01/2016  . Hydronephrosis left with infection s/p ureteral stenting 05/25/2016 06/01/2016  . 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 Anxiety state 05/27/2016  . Pain due to ureteral stent (HCC) 05/27/2016  . Pelvic abscess in female 05/15/2016  . TOA (tubo-ovarian abscess) 04/19/2016  . Pelvic pain in female 04/18/2016  . PCOS (polycystic ovarian syndrome) 11/14/2012  . Gestational diabetes mellitus, class A1 11/14/2012    Palliative Care Assessment & Plan   Patient Profile: 30 yo with complex tubovarian abscess s/p extensive pelvic and abdominal surgery. Difficult to manage pain in setting of hydromorphone induced hyperalgesia, rapid opiate tolerance and psychosocial pressure leading to worsening pain and suffering.  Assessment: Palliative  following for holistic support and pain management. Sheila Willis is dealing with serious complications following surgical removal of a tuboovarian abscess, she has a complicated wound, colostomy and rectal drain. Significant pain and existential suffering. Complex home situation and life stress complicating this.   1. Hyperalgesia has resolved after opoid rotation. 2. Anticipatory Anxiety better on Klonopin. 3. Urelle helping with ureteral pain 4. Holding cymbalta until off urelle 5. Liquid stools, previously some opiate induced constipation- inaccurate measurementys 6. Dehydrated- orthostatics consistent with dehydration- fluid bolus=- discussed with Sheila Marlyne BeardsJennings PA 7. Abdominal Pain- now on TD Fentanyl- would like to start this at a lower dose - minimal difference after cutting PCA back by 50%.   Recommendations/Plan:  Change Fentanyl to 25mcg TD today  Used PRN Percocet 1-2 tabs q4 prn for pain  Back up IV Fentanyl bolus  Ok with continuing robaxin  IV fluid biolus  Sheila need continued outpatient taper of opiates. Also recommend script be given for klonopin at discharge.  Goals of Care and Additional Recommendations:  Limitations on Scope of Treatment: Full Scope Treatment  Code Status:    Code Status Orders        Start     Ordered   05/15/16 1548  Full code  Continuous     05/15/16 1547    Code Status History    Date Active Date Inactive Code Status Order ID Comments User Context   05/13/2016  3:27 PM 05/15/2016  3:47 PM Full Code [id number]  Sherrie GeorgeWillard Jennings, PA-C ED   04/18/2016  4:07 PM 05/01/2016  5:11 PM Full Code [id number]  Noland FordyceKelly Fogleman, MD Inpatient   11/14/2012 12:37 AM 11/15/2012  9:23 PM Full Code [id number]  Royann ShiversSusan Teeters Burns, RN Inpatient      Discharge Planning:  To Be Determined- per CCS  Care plan was discussed with CCS PA, Patient.  Thank you for allowing the Palliative Medicine Team to assist in the care of this patient.   Time In: 11AM Time Out:  11:35 Total Time 35 min Prolonged Time Billed no      Greater than 50%  of this time was spent counseling and coordinating care related to the above assessment and plan.  Rubel Heckard, DO  Please contact Palliative Medicine Team phone at [phone] for questions and concerns.

## 2016-06-02 NOTE — Progress Notes (Signed)
8 Days Post-Op  Subjective:  Patient doing well this a.m. she has showered, and concern of leakage from the ostomy bag after the shower. Ostomy is working well. Open site looks good, the drain has been removed. She had a syncopal episode yesterday which may be secondary to some dehydration/vagal response.     Objective: Vital signs in last 24 hours: Temp:  [98.1 F (36.7 C)-98.5 F (36.9 C)] 98.1 F (36.7 C) (08/21 0511) Pulse Rate:  [91-96] 91 (08/21 0511) Resp:  [14-16] 14 (08/21 0511) BP: (103-108)/(66-78) 108/75 (08/21 0511) SpO2:  [100 %] 100 % (08/21 0511) Last BM Date: 06/02/16 1100 by mouth 200 per colostomy Afebrile vital signs are stable she does get tachycardic with standing. Labs yesterday shows white count is still up at 15.7 BMP is stable. CT on 05/29/16: Shows a slight decrease in the size the tube ovarian abscess. PCA discontinued over the weekend  Intake/Output from previous day: 08/20 0701 - 08/21 0700 In: 1110 [P.O.:1110] Out: 751 [Urine:550; Stool:201] Intake/Output this shift: No intake/output data recorded.  General appearance: alert, cooperative and no distress Resp: clear to auscultation bilaterally GI: Soft, nontender positive bowel sounds ostomy is working well drain has been removed and the open wound looks good. Still some. The drainage on the dressing at the base of the wound.  Lab Results:   Recent Labs  06/01/16 1045  WBC 15.7*  HGB 10.6*  HCT 32.4*  PLT 542*    BMET  Recent Labs  06/01/16 1045  NA 137  K 4.1  CL 103  CO2 25  GLUCOSE 98  BUN 15  CREATININE 1.01*  CALCIUM 10.0   PT/INR No results for input(s): LABPROT, INR in the last 72 hours.   Recent Labs Lab 05/29/16 1550 06/01/16 1045  AST 14* 18  ALT 7* 8*  ALKPHOS 113 99  BILITOT 0.3 0.2*  PROT 7.5 8.2*  ALBUMIN 2.4* 2.8*     Lipase     Component Value Date/Time   LIPASE 26 05/13/2016 0953     Studies/Results: No results found.  Medications: .  cefoTEtan (CEFOTAN) IV  2 g Intravenous Q12H  . doxycycline  100 mg Oral BID WC   And  . ondansetron  8 mg Oral BID  . famotidine  20 mg Oral BID  . feeding supplement (ENSURE ENLIVE)  237 mL Oral BID BM  . fentaNYL  75 mcg Transdermal Q72H  . heparin subcutaneous  5,000 Units Subcutaneous Q8H  . lidocaine  2 patch Transdermal Q24H  . lip balm  1 application Topical BID  . methocarbamol  1,000 mg Oral QID  . mirabegron ER  50 mg Oral Daily  . naproxen  500 mg Oral BID WC  . polyethylene glycol  17 g Oral BID  . saccharomyces boulardii  250 mg Oral BID  . URELLE  1 tablet Oral QID  . 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 ID consult [name] 04/22/16 [name]- Invanz x 6 days; ceftriaxone therapy initiated 04/28/16 with planned course of 21 days.  Currently doxycycline 100 mg twice a day/cefotetan 2 g IV every 12 Pain control: Currently getting fentanyl 50 g IV q2hwhen necessary she had 200 g yesterday and 200 g so  far today. Lidocaine patch, Robaxin, Naprosyn. Duragesic patch 75 g every 72 H. FEN: Regular diet DVT: Heparin   Plan: Dr. Renette ButtersGolden is going to see her now and work further on adjusting her pain medications I'll repeat her labs in the morning, ongoing concern with rising WBC. I'm going to give her another fluid bolus today, 1 L over 6 hours recheck orthostatics tomorrow.   LOS: 20 days    Sadeen Wiegel 06/02/2016 [phone]

## 2016-06-03 LAB — CBC
HEMATOCRIT: 27.5 % — AB (ref 36.0–46.0)
HEMOGLOBIN: 9.1 g/dL — AB (ref 12.0–15.0)
MCH: 29.3 pg (ref 26.0–34.0)
MCHC: 33.1 g/dL (ref 30.0–36.0)
MCV: 88.4 fL (ref 78.0–100.0)
Platelets: 458 10*3/uL — ABNORMAL HIGH (ref 150–400)
RBC: 3.11 MIL/uL — ABNORMAL LOW (ref 3.87–5.11)
RDW: 15.2 % (ref 11.5–15.5)
WBC: 9 10*3/uL (ref 4.0–10.5)

## 2016-06-03 LAB — BASIC METABOLIC PANEL
ANION GAP: 6 (ref 5–15)
BUN: 13 mg/dL (ref 6–20)
CHLORIDE: 101 mmol/L (ref 101–111)
CO2: 28 mmol/L (ref 22–32)
Calcium: 9.2 mg/dL (ref 8.9–10.3)
Creatinine, Ser: 0.79 mg/dL (ref 0.44–1.00)
GFR calc non Af Amer: >60 mL/min (ref >60)
GLUCOSE: 97 mg/dL (ref 65–99)
POTASSIUM: 3.6 mmol/L (ref 3.5–5.1)
Sodium: 135 mmol/L (ref 135–145)

## 2016-06-03 LAB — PREALBUMIN: Prealbumin: 20.4 mg/dL (ref 18–38)

## 2016-06-03 MED ORDER — PSYLLIUM 95 % PO PACK
1.0000 | PACK | Freq: Every day | ORAL | Status: DC
  Administered 2016-06-03: 1 via ORAL
  Filled 2016-06-03 (×2): qty 1

## 2016-06-03 MED ORDER — TAB-A-VITE/IRON PO TABS
1.0000 | ORAL_TABLET | Freq: Every day | ORAL | Status: DC
  Administered 2016-06-03: 1 via ORAL
  Filled 2016-06-03 (×2): qty 1

## 2016-06-03 MED ORDER — DOCUSATE SODIUM 100 MG PO CAPS
200.0000 mg | ORAL_CAPSULE | Freq: Every day | ORAL | Status: DC
  Administered 2016-06-03 – 2016-06-04 (×2): 200 mg via ORAL
  Filled 2016-06-03 (×2): qty 2

## 2016-06-03 MED ORDER — FENTANYL CITRATE (PF) 100 MCG/2ML IJ SOLN
12.5000 ug | INTRAMUSCULAR | Status: DC | PRN
  Administered 2016-06-04 (×3): 12.5 ug via INTRAVENOUS
  Filled 2016-06-03 (×3): qty 2

## 2016-06-03 NOTE — Consult Note (Signed)
WOC Nurse ostomy consult note Stoma type/location: LLQ Colostomy Stomal assessment/size: 1 1/2" slightly budded, creasing at 3 and 9 o'clock.  Peristomal assessment: Creasing Treatment options for stomal/peristomal skin: Barrier ring Output Soft brown stool Ostomy pouching: 2pc. 2 1/4" pouch with barrier ring  Education provided: Patient encouraged to take an active role in pouch change today instead of mother.  Removed pouch and cut barrier to fit with coaching.  Applied barrier ring and pouch independently with coaching.  Able to open close roll closure.  Independent with emptying.  Discussed frequency of pouch changes, emptying when 1/3 full.   [place] in [employer] [place] program: Yes WOC team will follow.  Maple HudsonKaren Correen Bubolz RN BSN CWON Pager [phone]

## 2016-06-03 NOTE — Progress Notes (Signed)
Orthostatic vital signs completed this AM at 0600. Please see FlowSheets for data

## 2016-06-03 NOTE — Progress Notes (Signed)
Patient ID: Sheila Willis, female   DOB: [birth year], 30 y.o.   MRN: [id number]  Assessment: Left ureteral obstruction secondary to pelvic abscess  - She seems to be doing well overall and from a urologic standpoint seems to be tolerating her stent fairly well.  She had no CVA tenderness and denies any flank pain and her only urologic complaint is that of some irritative voiding symptoms undoubtedly from the stent.  She reported that she has seen marked improvement on Myrbetriq but still is having some symptoms we discussed continuing the Myrbetriq and then if she remains symptomatic when she saw me in follow-up we could add an anticholinergic as well. Her stent will need to remain in place until her abscess has completely resolved and then she will need to undergo cystoscopy and left retrograde pyelogram in order to be sure her ureter is unobstructed and her stent can be left out.  This will be performed as an outpatient.  Plan: 1.  Continue Myrbetriq 50 mg p.o. Q. Day 2.She will follow-up with me as an outpatient.  My follow-up information has been placed on the chart.    Subjective: Patient reports that she is currently not having any significant pain. She said the area that she is having the most difficult is in her incision.  She is also having some discomfort in the left lower quadrant.  She is not having any flank pain.  She was having a lot of irritative voiding symptoms from her stent but the but since she began taking the Myrbetriq she has noted significant improvement although some symptoms still persist.  Objective: Vital signs in last 24 hours: Temp:  [98.3 F (36.8 Willis)-98.6 F (37 Willis)] 98.6 F (37 Willis) (08/22 1328) Pulse Rate:  [90-99] 91 (08/22 1328) Resp:  [14] 14 (08/22 1328) BP: (102-117)/(62-73) 112/73 (08/22 1328) SpO2:  [99 %-100 %] 99 % (08/22 1328)A  Intake/Output from previous day: 08/21 0701 - 08/22 0700 In: 1850 [P.O.:750; IV Piggyback:1100] Out: 675 [Urine:450;  Stool:225] Intake/Output this shift: Total I/O In: 600 [P.O.:600] Out: 0   Past Medical History:  Diagnosis Date  . Depression   . Gestational diabetes   . Headache(784.0)   . PCOS (polycystic ovarian syndrome)   . Postpartum care following vaginal delivery (2/1) 11/13/2012  . SVD (spontaneous vaginal delivery) 11/13/2012    Physical Exam:  Lungs - Normal respiratory effort, chest expands symmetrically.  Abdomen - Soft, non-tender & non-distended.  Lab Results:  Recent Labs  06/01/16 1045 06/03/16 0500  WBC 15.7* 9.0  HGB 10.6* 9.1*  HCT 32.4* 27.5*   BMET  Recent Labs  06/01/16 1045 06/03/16 0500  NA 137 135  K 4.1 3.6  CL 103 101  CO2 25 28  GLUCOSE 98 97  BUN 15 13  CREATININE 1.01* 0.79  CALCIUM 10.0 9.2   No results for input(s): LABURIN in the last 72 hours. Results for orders placed or performed during the hospital encounter of 05/13/16  Blood culture (routine x 2)     Status: None   Collection Time: 05/13/16  9:54 AM  Result Value Ref Range Status   Specimen Description BLOOD RIGHT ANTECUBITAL  Final   Special Requests BOTTLES DRAWN AEROBIC AND ANAEROBIC 5ML  Final   Culture   Final    NO GROWTH 5 DAYS Performed at [facility]    Report Status 05/18/2016 FINAL  Final  Blood culture (routine x 2)     Status: None   Collection  Time: 05/13/16 10:42 AM  Result Value Ref Range Status   Specimen Description BLOOD LEFT ANTECUBITAL  Final   Special Requests BOTTLES DRAWN AEROBIC AND ANAEROBIC 5CC  Final   Culture   Final    NO GROWTH 5 DAYS Performed at [facility]    Report Status 05/18/2016 FINAL  Final  Culture, Urine     Status: Abnormal   Collection Time: 05/13/16  1:00 PM  Result Value Ref Range Status   Specimen Description URINE, RANDOM  Final   Special Requests NONE  Final   Culture (A)  Final    <10,000 COLONIES/mL INSIGNIFICANT GROWTH Performed at [facility]    Report Status 05/14/2016 FINAL  Final  Surgical  pcr screen     Status: None   Collection Time: 05/25/16  4:38 AM  Result Value Ref Range Status   MRSA, PCR NEGATIVE NEGATIVE Final   Staphylococcus aureus NEGATIVE NEGATIVE Final    Comment:        The Xpert SA Assay (FDA approved for NASAL specimens in patients over 32 years of age), is one component of a comprehensive surveillance program.  Test performance has been validated by [facility] for patients greater than or equal to 32 year old. It is not intended to diagnose infection nor to guide or monitor treatment.   Culture, Urine     Status: None   Collection Time: 05/26/16  1:36 PM  Result Value Ref Range Status   Specimen Description URINE, CLEAN CATCH  Final   Special Requests NONE  Final   Culture NO GROWTH Performed at [facility]   Final   Report Status 05/28/2016 FINAL  Final    Studies/Results: No results found.    Sheila Willis 06/03/2016, 4:53 PM

## 2016-06-03 NOTE — Progress Notes (Signed)
9 Days Post-Op  Subjective: Fentanyl IV: 75 mg yesterday, 75 mg today so far Fentanyl Duragesic patch 25 g Lidoderm patch 1 she uses this in the p.m.  Robaxin 1 g 4 times a day by mouth Percocet 3 tablets yesterday, 2 tablets today so far Compazine 1 urelle 81 mg 4 times a day mirabegron ER (MYRBETRIQ) tablet 50 mg -daily Her biggest complaint is feeling bloated this a.m. Her her mother is asking about discharge date. I think both she and the patient are actively considering this now. I told her she had to be completely off the IV fentanyl, and recommended she use the Percocet more frequently. I also told like urology to see her again discuss meds prior to discharge for her ureteral stent discomfort. Home health is already on board so discharge from that standpoint will not be difficult. I also think she is still a bit dry and intact told her to increase her fluid intake. I'm going to discontinue her MiraLAX, place her on fiber and Colace. Continue to mobilize  Objective: Vital signs in last 24 hours: Temp:  [98.3 F (36.8 C)-98.5 F (36.9 C)] 98.3 F (36.8 C) (08/22 0600) Pulse Rate:  [90-99] 90 (08/22 0600) Resp:  [14] 14 (08/22 0600) BP: (102-117)/(62-70) 117/70 (08/22 0600) SpO2:  [100 %] 100 % (08/21 2219) Last BM Date: 06/02/16  PO 750 450 urine recorded Stool 225 recorded - I know she had close to that when I saw her yesterday AM. Afebrile, VSS Labs OK WBC is back to 9.0 Still a little tachy on standing Intake/Output from previous day: 08/21 0701 - 08/22 0700 In: 1850 [P.O.:750; IV Piggyback:1100] Out: 675 [Urine:450; Stool:225] Intake/Output this shift: No intake/output data recorded.  General appearance: alert, cooperative and no distress Resp: clear to auscultation bilaterally GI: soft, non-tender; bowel sounds normal; no masses,  no organomegaly and Open wound continues to look good., She has a large amount of stool in her ostomy bag is morning, and it is working  well.  Lab Results:   Recent Labs  06/01/16 1045 06/03/16 0500  WBC 15.7* 9.0  HGB 10.6* 9.1*  HCT 32.4* 27.5*  PLT 542* 458*    BMET  Recent Labs  06/01/16 1045 06/03/16 0500  NA 137 135  K 4.1 3.6  CL 103 101  CO2 25 28  GLUCOSE 98 97  BUN 15 13  CREATININE 1.01* 0.79  CALCIUM 10.0 9.2   PT/INR No results for input(s): LABPROT, INR in the last 72 hours.   Recent Labs Lab 05/29/16 1550 06/01/16 1045  AST 14* 18  ALT 7* 8*  ALKPHOS 113 99  BILITOT 0.3 0.2*  PROT 7.5 8.2*  ALBUMIN 2.4* 2.8*     Lipase     Component Value Date/Time   LIPASE 26 05/13/2016 0953     Studies/Results: No results found.  Medications: . cefoTEtan (CEFOTAN) IV  2 g Intravenous Q12H  . doxycycline  100 mg Oral BID WC   And  . ondansetron  8 mg Oral BID  . famotidine  20 mg Oral BID  . feeding supplement (ENSURE ENLIVE)  237 mL Oral BID BM  . fentaNYL  25 mcg Transdermal Q72H  . heparin subcutaneous  5,000 Units Subcutaneous Q8H  . lidocaine  2 patch Transdermal Q24H  . lip balm  1 application Topical BID  . methocarbamol  1,000 mg Oral QID  . mirabegron ER  50 mg Oral Daily  . saccharomyces boulardii  250 mg Oral  BID  . URELLE  1 tablet Oral QID  . 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 Complex recurrent left tubo-ovarian abscess, colocutaneous fistula Exploratory laparotomy, drainage of abscess, partial descending colectomy,colostomy, incidental appendectomy, 05/15/16, [name] Cystoscopy with left retrograde pyelogram including interpretation.  Left internal/external open-ended ureteral stent placement 05/15/16,[name] Left ureteral obstruction:  CYSTOSCOPY WITH RETROGRADE PYELOGRAM/LEFT URETERAL STENT PLACEMENT (Left);  Sebastian Acheheodore Manny  ID consult [name] 04/22/16 [name]- Invanz x 6 days; ceftriaxone therapy initiated 04/28/16 with planned course of 21 days.  Currently doxycycline 100 mg twice a day/cefotetan 2 g IV every 12  - day 13/20 Pain control: Currently getting fentanyl 50 g IV q2hwhen necessary she had 200 g yesterday and 200 g so far today. Lidocaine patch, Robaxin, Naprosyn. Duragesic patch 75 g every 72 H. FEN: Regular diet DVT: Heparin    Plan: Ask urology to see again about stent discomfort and discharge meds. Continue to wean off IV pain medications with active planning for discharge soon.     LOS: 21 days    Sully Dyment 06/03/2016 [phone]

## 2016-06-04 MED ORDER — FENTANYL 50 MCG/HR TD PT72
50.0000 ug | MEDICATED_PATCH | TRANSDERMAL | Status: DC
  Administered 2016-06-04: 50 ug via TRANSDERMAL
  Filled 2016-06-04: qty 1

## 2016-06-04 MED ORDER — HYDROCODONE-ACETAMINOPHEN 5-325 MG PO TABS
1.0000 | ORAL_TABLET | ORAL | Status: DC | PRN
Start: 2016-06-04 — End: 2016-06-05
  Administered 2016-06-04 – 2016-06-05 (×4): 2 via ORAL
  Filled 2016-06-04 (×4): qty 2

## 2016-06-04 MED ORDER — POLYETHYLENE GLYCOL 3350 17 G PO PACK
17.0000 g | PACK | Freq: Every day | ORAL | Status: DC
  Administered 2016-06-04 – 2016-06-05 (×2): 17 g via ORAL
  Filled 2016-06-04 (×2): qty 1

## 2016-06-04 MED ORDER — BELLADONNA ALKALOIDS-OPIUM 16.2-60 MG RE SUPP
1.0000 | Freq: Once | RECTAL | Status: AC
  Administered 2016-06-04: 1 via RECTAL
  Filled 2016-06-04: qty 1

## 2016-06-04 NOTE — Progress Notes (Signed)
Patient was having complaints of increased urinary urgency/ frequency. Pt reports having urinated 6-7 times in the last 1-2 hours. Notified on call physician for [facility]. Received a call back from Dr. Raynelle HighlandA. Dancey with new orders for a one time dose of a B&O suppository and to collect a UA.

## 2016-06-04 NOTE — Progress Notes (Signed)
10 Days Post-Op  Subjective: No real change. She complains of being bloated, now since she has nausea with Percocet. She wants to go back on the MiraLAX, despite the fact that her output appears much larger than her intake. Orthostatics are ordered but I'll see them this morning. I will also order daily weights again.  Objective: Vital signs in last 24 hours: Temp:  [98.6 F (37 C)-99 F (37.2 C)] 98.8 F (37.1 C) (08/23 [id number]) Pulse Rate:  [91-100] 100 (08/23 0642) Resp:  [14] 14 (08/23 0642) BP: (112-118)/(73-75) 118/75 (08/23 0642) SpO2:  [99 %-100 %] 100 % (08/23 0642) Last BM Date: 06/04/16 720 Po 300 stool recorded  Voided x 6 Weight not recorded Afebrile,  VSS Pre albumin up to 20 No other labs today Intake/Output from previous day: 08/22 0701 - 08/23 0700 In: 720 [P.O.:720] Out: 300 [Stool:300] Intake/Output this shift: No intake/output data recorded.  General appearance: alert, cooperative and no distress Resp: clear to auscultation bilaterally GI: Soft, good bowel sounds, ostomy sites working well. She does not seem overly tender or uncomfortable on exam.  Lab Results:   Recent Labs  06/01/16 1045 06/03/16 0500  WBC 15.7* 9.0  HGB 10.6* 9.1*  HCT 32.4* 27.5*  PLT 542* 458*    BMET  Recent Labs  06/01/16 1045 06/03/16 0500  NA 137 135  K 4.1 3.6  CL 103 101  CO2 25 28  GLUCOSE 98 97  BUN 15 13  CREATININE 1.01* 0.79  CALCIUM 10.0 9.2   PT/INR No results for input(s): LABPROT, INR in the last 72 hours.   Recent Labs Lab 05/29/16 1550 06/01/16 1045  AST 14* 18  ALT 7* 8*  ALKPHOS 113 99  BILITOT 0.3 0.2*  PROT 7.5 8.2*  ALBUMIN 2.4* 2.8*     Lipase     Component Value Date/Time   LIPASE 26 05/13/2016 0953     Studies/Results: No results found.  Medications: . cefoTEtan (CEFOTAN) IV  2 g Intravenous Q12H  . docusate sodium  200 mg Oral Daily  . doxycycline  100 mg Oral BID WC   And  . ondansetron  8 mg Oral BID  .  famotidine  20 mg Oral BID  . feeding supplement (ENSURE ENLIVE)  237 mL Oral BID BM  . fentaNYL  25 mcg Transdermal Q72H  . heparin subcutaneous  5,000 Units Subcutaneous Q8H  . lidocaine  2 patch Transdermal Q24H  . lip balm  1 application Topical BID  . methocarbamol  1,000 mg Oral QID  . mirabegron ER  50 mg Oral Daily  . multivitamins with iron  1 tablet Oral QPC lunch  . psyllium  1 packet Oral Daily  . saccharomyces boulardii  250 mg Oral BID  . URELLE  1 tablet Oral QID  . 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 Complex recurrent left tubo-ovarian abscess, colocutaneous fistula Exploratory laparotomy, drainage of abscess, partial descending colectomy,colostomy, incidental appendectomy, 05/15/16, [name] Cystoscopy with left retrograde pyelogram including interpretation.  Left internal/external open-ended ureteral stent placement 05/15/16,[name] Left ureteral obstruction:  CYSTOSCOPY WITH RETROGRADE PYELOGRAM/LEFT URETERAL STENT PLACEMENT (Left); Sebastian Acheheodore Manny  ID consult [name] 04/22/16 [name]- Invanz x 6 days; ceftriaxone therapy initiated 04/28/16 with planned course of 21 days.  Currently doxycycline 100 mg twice a day/cefotetan 2 g IV every 12  - day 13/20 Pain control: Currently getting fentanyl 50 g IV q2hwhen necessary she had 200 g yesterday and 200 g so far today. Lidocaine patch,Robaxin, Naprosyn.Duragesic patch 75 g every 72 H. FEN: Regular diet DVT: Heparin    Plan: I'm going to hold the Metamucil for now. I switched her over to the hydrocodone/Tylenol combination for oral pain. She is use 75 g of fentanyl IV yesterday, and 25 g of fentanyl this a.m. she took it 8 tablets of the Percocet yesterday. He also has a fentanyl 25 g  patch.  LOS: 22 days    Sheila Willis 06/04/2016 [phone]

## 2016-06-04 NOTE — Progress Notes (Signed)
Daily Progress Note   Patient Name: Sheila Willis       Date: 06/04/2016 DOB: 1986-09-09  Age: 30 y.o. MRN#: [id number] Attending Physician: Bishop LimboMd Ccs, MD Primary Care Physician: Lendon ColonelFOGLEMAN,KELLY A., MD Admit Date: 05/13/2016  Reason for Consultation/Follow-up: Pain control  Subjective: Sheila Willis is extremely lethargic, complains of feeling nauseated and is bloated. "taking to many pills making me sick". Feels very weak.  Length of Stay: 22  Current Medications: Scheduled Meds:  . cefoTEtan (CEFOTAN) IV  2 g Intravenous Q12H  . doxycycline  100 mg Oral BID WC   And  . ondansetron  8 mg Oral BID  . famotidine  20 mg Oral BID  . feeding supplement (ENSURE ENLIVE)  237 mL Oral BID BM  . fentaNYL  50 mcg Transdermal Q48H  . heparin subcutaneous  5,000 Units Subcutaneous Q8H  . lidocaine  2 patch Transdermal Q24H  . lip balm  1 application Topical BID  . mirabegron ER  50 mg Oral Daily  . polyethylene glycol  17 g Oral Daily  . saccharomyces boulardii  250 mg Oral BID  . URELLE  1 tablet Oral QID  . vitamin C  500 mg Oral BID    Continuous Infusions:    PRN Meds: acetaminophen, alum & mag hydroxide-simeth, clonazePAM, diphenhydrAMINE, fentaNYL (SUBLIMAZE) injection, HYDROcodone-acetaminophen, magic mouthwash, menthol-cetylpyridinium, metoprolol, phenol, prochlorperazine, sodium chloride flush  Physical Exam          Vital Signs: BP 118/75 (BP Location: Left Arm)   Pulse 100   Temp 98.8 F (37.1 C) (Oral)   Resp 14   Ht 5\' 2"  (1.575 m)   Wt 58.1 kg (128 lb)   SpO2 100%   BMI 23.41 kg/m  SpO2: SpO2: 100 % O2 Device: O2 Device: Not Delivered O2 Flow Rate: O2 Flow Rate (L/min): 0 L/min  Intake/output summary:   Intake/Output Summary (Last 24 hours) at 06/04/16 1156 Last data  filed at 06/04/16 1020  Gross per 24 hour  Intake              600 ml  Output              650 ml  Net              -50 ml   LBM: Last BM Date: 06/04/16  Baseline Weight: Weight: 58.1 kg (128 lb) Most recent weight: Weight: 58.1 kg (128 lb)       Palliative Assessment/Data:  Flowsheet Rows   Flowsheet Row Most Recent Value  Intake Tab  Referral Department  Surgery  Unit at Time of Referral  Med/Surg Unit  Palliative Care Primary Diagnosis  Pain  Date Notified  05/20/16  Palliative Care Type  New Palliative care  Reason for referral  Pain  Date of Admission  05/13/16  Date first seen by Palliative Care  05/20/16  # of days Palliative referral response time  0 Day(s)  # of days IP prior to Palliative referral  7  Clinical Assessment  Psychosocial & Spiritual Assessment  Palliative Care Outcomes      Patient Active Problem List   Diagnosis Date Noted  . 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 Colostomy in place [facility](HCC) 06/01/2016  . Hydronephrosis left with infection s/p ureteral stenting 05/25/2016 06/01/2016  . 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 Anxiety state 05/27/2016  . Pain due to ureteral stent (HCC) 05/27/2016  . Pelvic abscess in female 05/15/2016  . TOA (tubo-ovarian abscess) 04/19/2016  . Pelvic pain in female 04/18/2016  . PCOS (polycystic ovarian syndrome) 11/14/2012  . Gestational diabetes mellitus, class A1 11/14/2012    Palliative Care Assessment & Plan   Patient Profile: 30 yo with complex tubovarian abscess s/p extensive pelvic and abdominal surgery. Difficult to manage pain in setting of hydromorphone induced hyperalgesia, rapid opiate tolerance and psychosocial pressure leading to worsening pain and suffering.  Assessment: Palliative following for holistic support and pain management. Sheila Willis is dealing with serious complications following surgical removal of a tuboovarian abscess,  she has a complicated wound, colostomy and rectal drain. Significant pain and existential suffering. Complex home situation and life stress complicating this.   PATIENT FOUND TODAY WITHOUT DURAGESIC PATCH ON HER BODY. Unclear how this occurred- her dose was adjusted yesterday but new dose patch was not applied by nursing. She has now been without basal opiate for >24 hours- she is showing signs of low level withdrawal nausea, anxiety, muscle cramping. This is a significant set back in her pain management and progress. I have ordered a 50 mcg Duragesic to be placed now, I recommended using IV fentanyl for the next 12-24 hours until patch can get back to a therapeutic level/steady state.  -discontinued schedule Robaxin - this is probably just making her fatigued and weak and not getting at underlying source of her pain. She has mostly incisional pain and generalized abdominal pain post op -rapid escalation of opiates over the last 2 months has caused additional tolerance to opiates-she will need outpatient pain management plan and close follow. -OK leaving hydrocodone-APAP as oral breakthrough- can also just use oral Morphine IR if needed. Avoid hydromorphone. -continue Klonopin prn  Goals of Care and Additional Recommendations:  Limitations on Scope of Treatment: Full Scope Treatment  Code Status:    Code Status Orders        Start     Ordered   05/15/16 1548  Full code  Continuous     05/15/16 1547    Code Status History    Date Active Date Inactive Code Status Order ID Comments User Context   05/13/2016  3:27 PM 05/15/2016  3:47 PM Full Code [id number]  Sherrie GeorgeWillard Jennings, PA-C ED   04/18/2016  4:07 PM 05/01/2016  5:11 PM Full Code [id number]  Noland FordyceKelly Fogleman, MD Inpatient   11/14/2012 12:37  AM 11/15/2012  9:23 PM Full Code [id number]  Royann Shivers, RN Inpatient      Discharge Planning:  To Be Determined- per CCS  Care plan was discussed with CCS PA, Patient.  Thank you for allowing the  Palliative Medicine Team to assist in the care of this patient.   Time In: 11:30 Time Out: 12:05 Total Time 35 min Prolonged Time Billed no      Greater than 50%  of this time was spent counseling and coordinating care related to the above assessment and plan.  Iridiana Fonner, DO  Please contact Palliative Medicine Team phone at [phone] for questions and concerns.

## 2016-06-04 NOTE — Progress Notes (Addendum)
Patient refuses to take percocet as initial prn pain med for relief. Pt states " they make me nauseous and hurt my stomach".  Day shift nurse made aware to f/u with rounding M.D.   Pt tearful this morning. Home environment, and possible Body image disturbance? Noted patient does not like to look at incision or colostomy. Followed up with day shift nurse to see if Psych consult possible per M.D. Advice.

## 2016-06-05 LAB — URINALYSIS, ROUTINE W REFLEX MICROSCOPIC
BILIRUBIN URINE: NEGATIVE
Glucose, UA: NEGATIVE mg/dL
KETONES UR: NEGATIVE mg/dL
NITRITE: NEGATIVE
PROTEIN: 30 mg/dL — AB
Specific Gravity, Urine: 1.017 (ref 1.005–1.030)
pH: 6 (ref 5.0–8.0)

## 2016-06-05 LAB — URINE MICROSCOPIC-ADD ON

## 2016-06-05 MED ORDER — MORPHINE SULFATE 15 MG PO TABS: 15.0000 mg | ORAL_TABLET | ORAL | Status: DC | PRN

## 2016-06-05 MED ORDER — POLYETHYLENE GLYCOL 3350 17 G PO PACK: PACK | ORAL | 0 refills | Status: DC

## 2016-06-05 MED ORDER — CLONAZEPAM 0.5 MG PO TABS: 0.5000 mg | ORAL_TABLET | Freq: Two times a day (BID) | ORAL | 0 refills | Status: DC | PRN

## 2016-06-05 MED ORDER — FENTANYL 50 MCG/HR TD PT72: 50.0000 ug | MEDICATED_PATCH | TRANSDERMAL | 0 refills | Status: DC

## 2016-06-05 MED ORDER — URELLE 81 MG PO TABS: 1.0000 | ORAL_TABLET | Freq: Four times a day (QID) | ORAL | 0 refills | Status: DC

## 2016-06-05 MED ORDER — ONDANSETRON 8 MG PO TBDP: 8.0000 mg | ORAL_TABLET | Freq: Two times a day (BID) | ORAL | 0 refills | Status: DC

## 2016-06-05 MED ORDER — MORPHINE SULFATE 15 MG PO TABS: 15.0000 mg | ORAL_TABLET | ORAL | 0 refills | Status: DC | PRN

## 2016-06-05 MED ORDER — LIDOCAINE 5 % EX PTCH: 2.0000 | MEDICATED_PATCH | CUTANEOUS | 1 refills | Status: AC

## 2016-06-05 MED ORDER — DEXTROSE 5 % IV SOLN: 2.0000 g | Freq: Two times a day (BID) | INTRAVENOUS | 0 refills | Status: AC

## 2016-06-05 MED ORDER — DOXYCYCLINE HYCLATE 100 MG PO TABS: 100.0000 mg | ORAL_TABLET | Freq: Two times a day (BID) | ORAL | 1 refills | Status: DC

## 2016-06-05 MED ORDER — MIRABEGRON ER 50 MG PO TB24: 50.0000 mg | ORAL_TABLET | Freq: Every day | ORAL | 0 refills | Status: DC

## 2016-06-05 NOTE — Progress Notes (Signed)
[facility] for Infectious Disease    Final abtx recs:  Diagnosis: Tubo-ovarian abscess  Culture Result: negative  Allergies  Allergen Reactions  . Latex Itching    Discharge antibiotics: Per pharmacy protocol  Cefotetan 2gm Q 12 HR  Duration: 10 days End Date: Dep 3rd  [place] Per Protocol:  Labs weekly while on IV antibiotics: _x CBC with differential  __x CMP __ CRP __ ESR   Fax weekly labs to [phone]  Clinic Follow Up Appt: Aug 31st  @ [facility] Dr Leonette Tischer   Baxter Flattery, [facility] for Infectious Diseases Cell: [phone] Pager: [phone]  06/05/2016, 2:15 PM

## 2016-06-05 NOTE — Progress Notes (Signed)
Pharmacy has scheduled a removal time for Fentanyl patch for 1259. No time scheduled for application of new patch. Will call pharmacy.
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
## 2016-06-05 NOTE — Progress Notes (Signed)
Spoke with pt who has decided she would like to go home. She feels ready, with mom's help with colostomy care and dressing changes. Pt reports as long as she has a med for nausea, an antibiotic, and her bladder med, she will do fine at home. Page to Zola ButtonWill Jennings and Dr. Phillips OdorGolding.

## 2016-06-05 NOTE — Discharge Instructions (Signed)
Call Dr. Phillips OdorGolding for issues with your pain medications. Call Dr. Maris Bergerosenbower's office and be sure they have a phone number they can reach you at for arrangements for your repeat CT scan.    [facility]      [facility], [place] [phone]  OPEN ABDOMINAL SURGERY: POST OP INSTRUCTIONS  Always review your discharge instruction sheet given to you by the facility where your surgery was performed.  IF YOU HAVE DISABILITY OR FAMILY LEAVE FORMS, YOU MUST BRING THEM TO THE OFFICE FOR PROCESSING.  PLEASE DO NOT GIVE THEM TO YOUR DOCTOR.  1. A prescription for pain medication may be given to you upon discharge.  Take your pain medication as prescribed, if needed.  If narcotic pain medicine is not needed, then you may take acetaminophen (Tylenol) or ibuprofen (Advil) as needed. 2. Take your usually prescribed medications unless otherwise directed. 3. If you need a refill on your pain medication, please contact your pharmacy. They will contact our office to request authorization.  Prescriptions will not be filled after 5pm or on week-ends. 4. You should follow a light diet the first few days after arrival home, such as soup and crackers, pudding, etc.unless your doctor has advised otherwise. A high-fiber, low fat diet can be resumed as tolerated.   Be sure to include lots of fluids daily. Most patients will experience some swelling and bruising on the chest and neck area.  Ice packs will help.  Swelling and bruising can take several days to resolve 5. Most patients will experience some swelling and bruising in the area of the incision. Ice pack will help. Swelling and bruising can take several days to resolve..  6. It is common to experience some constipation if taking pain medication after surgery.  Increasing fluid intake and taking a stool softener will usually help or prevent this problem from occurring.  A mild laxative (Milk of Magnesia or Miralax) should be taken according to package directions if there  are no bowel movements after 48 hours. 7.  You may have steri-strips (small skin tapes) in place directly over the incision.  These strips should be left on the skin for 7-10 days.  If your surgeon used skin glue on the incision, you may shower in 24 hours.  The glue will flake off over the next 2-3 weeks.  Any sutures or staples will be removed at the office during your follow-up visit. You may find that a light gauze bandage over your incision may keep your staples from being rubbed or pulled. You may shower and replace the bandage daily. 8. ACTIVITIES:  You may resume regular (light) daily activities beginning the next day--such as daily self-care, walking, climbing stairs--gradually increasing activities as tolerated.  You may have sexual intercourse when it is comfortable.  Refrain from any heavy lifting or straining until approved by your doctor. a. You may drive when you no longer are taking prescription pain medication, you can comfortably wear a seatbelt, and you can safely maneuver your car and apply brakes b. Return to Work: ___________________________________ 9. You should see your doctor in the office for a follow-up appointment approximately two weeks after your surgery.  Make sure that you call for this appointment within a day or two after you arrive home to insure a convenient appointment time. OTHER INSTRUCTIONS:  _____________________________________________________________ _____________________________________________________________  WHEN TO CALL YOUR DOCTOR: 1. Fever over 101.0 2. Inability to urinate 3. Nausea and/or vomiting 4. Extreme swelling or bruising 5. Continued bleeding from incision. 6.  Increased pain, redness, or drainage from the incision. 7. Difficulty swallowing or breathing 8. Muscle cramping or spasms. 9. Numbness or tingling in hands or feet or around lips.  The clinic staff is available to answer your questions during regular business hours.  Please dont  hesitate to call and ask to speak to one of the nurses if you have concerns.  For further questions, please visit [email or web address]  Post stent placement instructions   Definitions:  Ureter: The duct that transports urine from the kidney to the bladder. Stent: A plastic hollow tube that is placed into the ureter, from the kidney to the bladder to prevent the ureter from swelling shut.  General instructions:  Despite the fact that no skin incisions were used, the area around the ureter and bladder is raw and irritated. The stent is a foreign body which can further irritate the bladder wall. This irritation is manifested by increased frequency of urination, both day and night, and by an increase in the urge to urinate. In some, the urge to urinate is present almost always. Sometimes the urge is strong enough that you may not be able to stop your self from urinating. This can often be controlled with medication but does not occur in everyone. A stent can safely be left in place for 3 months or greater.  You may see some blood in your urine while the stent is in place and a few days afterward. Do not be alarmed, even if the urine is clear for a while. Get off your feet and drink lots of fluids until clearing occurs. If you start to pass clots or don't improve, call us.  Diet:  You may return to your normal diet immediately. Because of the raw surface of your bladder, alcohol, spicy foods, foods high in acid and drinks with caffeine may cause irritation or frequency and should be used in moderation. To keep your urine flowing freely and avoid constipation, drink plenty of fluids during the day (8-10 glasses). Tip: Avoid cranberry juice because it is very acidic.  Activity:  Your physical activity doesn't need to be restricted. However, if you are very active, you may see some blood in the urine. We suggest that you reduce your activity under the circumstances until the bleeding has  stopped.  Bowels:  It is important to keep your bowels regular during the postoperative period. Straining with bowel movements can cause bleeding. A bowel movement every other day is reasonable. Use a mild laxative if needed, such as milk of magnesia 2-3 tablespoons, or 2 Dulcolax tablets. Call if you continue to have problems. If you had been taking narcotics for pain, before, during or after your surgery, you may be constipated. Take a laxative if necessary.  Medication:  You should resume your pre-surgery medications unless told not to. In addition you may be given an antibiotic to prevent or treat infection. Antibiotics are not always necessary. All medication should be taken as prescribed until the bottles are finished unless you are having an unusual reaction to one of the drugs.  Problems you should report to [place]:  a. Fever greater than 101F. b. Heavy bleeding, or clots (see notes above about blood in urine). c. Inability to urinate. d. Drug reactions (hives, rash, nausea, vomiting, diarrhea). e. Severe burning or pain with urination that is not improving. Colostomy Home Guide A colostomy is an opening for stool to leave your body when a medical condition prevents it from leaving through the usual opening (rectum).  During a surgery, a piece of large intestine (colon) is brought through a hole in the abdominal wall. The new opening is called a stoma or ostomy. A bag or pouch fits over the stoma to catch stool and gas. Your stool may be liquid, somewhat pasty, or formed. CARING FOR YOUR STOMA  Normally, the stoma looks a lot like the inside of your cheek: pink, red, and moist. At first it may be swollen, but this swelling will decrease within 6 weeks. Keep the skin around your stoma clean and dry. You can gently wash your stoma and the skin around your stoma in the shower with a clean, soft washcloth. If you develop any skin irritation, your caregiver may give you a stoma powder or ointment  to help heal the area. Do not use any products other than those specifically given to you by your caregiver.  Your stoma should not be uncomfortable. If you notice any stinging or burning, your pouch may be leaking, and the skin around your stoma may be coming into contact with stool. This can cause skin irritation. If you notice stinging, replace your pouch with a new one and discard the old one. OSTOMY POUCHES  The pouch that fits over the ostomy can be made up of either 1 or 2 pieces. A one-piece pouch has a skin barrier piece and the pouch itself in one unit. A two-piece pouch has a skin barrier with a separate pouch that snaps on and off of the skin barrier. Either way, you should empty the pouch when it is only  to  full. Do not let more stool or gas build up. This could cause the pouch to leak. Some ostomy bags have a built-in gas release valve. Ostomy deodorizer (5 drops) can be put into the pouch to prevent odor. Some people use ostomy lubricant drops inside the pouch to help the stool slide out of the bag more easily and completely.  EMPTYING YOUR OSTOMY POUCH  You may get lessons on how to empty your pouch from a wound-ostomy nurse before you leave the hospital. Here are the basic steps:  Wash your hands with soap and water.  Sit far back on the toilet.  Put several pieces of toilet paper into the toilet water. This will prevent splashing as you empty the stool into the toilet bowl.  Unclip or unvelcro the tail end of the pouch.  Unroll the tail and empty stool into the toilet.  Clean the tail with toilet paper.  Reroll the tail, and clip or velcro it closed.  Wash your hands again. CHANGING YOUR OSTOMY POUCH  Change your ostomy pouch about every 3 to 4 days for the first 6 weeks, then every 5 to7 days. Always change the bag sooner if there is any leakage or you begin to notice any discomfort or irritation of the skin around the stoma. When possible, plan to change your ostomy  pouch before eating or drinking as this will lessen the chance of stool coming out during the pouch change. A wound-ostomy nurse may teach you how to change your pouch before you leave the hospital. Here are the basic steps:  Lay out your supplies.  Wash your hands with soap and water.  Carefully remove the old pouch.  Wash the stoma and allow it to dry. Men may be advised to shave any hair around the stoma very carefully. This will make the adhesive stick better.  Use the stoma measuring guide that comes with your  pouch set to decide what size hole you will need to cut in the skin barrier piece. Choose the smallest possible size that will hold the stoma but will not touch it.  Use the guide to trace the circle on the back of the skin barrier piece. Cut out the hole.  Hold the skin barrier piece over the stoma to make sure the hole is the correct size.  Remove the adhesive paper backing from the skin barrier piece.  Squeeze stoma paste around the opening of the skin barrier piece.  Clean and dry the skin around the stoma again.  Carefully fit the skin barrier piece over your stoma.  If you are using a two-piece pouch, snap the pouch onto the skin barrier piece.  Close the tail of the pouch.  Put your hand over the top of the skin barrier piece to help warm it for about 5 minutes, so that it conforms to your body better.  Wash your hands again. DIET TIPS   Continue to follow your usual diet.  Drink about eight 8 oz glasses of water each day.  You can prevent gas by eating slowly and chewing your food thoroughly.  If you feel concerned that you have too much gas, you can cut back on gas-producing foods, such as:  Spicy foods.  Onions and garlic.  Cruciferous vegetables (cabbage, broccoli, cauliflower, Brussels sprouts).  Beans and legumes.  Some cheeses.  Eggs.  Fish.  Bubbly (carbonated) drinks.  Chewing gum. GENERAL TIPS   You can shower with or without the  bag in place.  Always keep the bag on if you are bathing or swimming.  If your bag gets wet, you can dry it with a blow-dryer set to cool.  Avoid wearing tight clothing directly over your stoma so that it does not become irritated or bleed. Tight clothing can also prevent stool from draining into the pouch.  It is helpful to always have an extra skin barrier and pouch with you when traveling. Do not leave them anywhere too warm, as parts of them can melt.  Do not let your seat belt rest on your stoma. Try to keep the seat belt either above or below your stoma, or use a tiny pillow to cushion it.  You can still participate in sports, but you should avoid activities in which there is a risk of getting hit in the abdomen.  You can still have sex. It is a good idea to empty your pouch prior to sex. Some people and their partners feel very comfortable seeing the pouch during sex. Others choose to wear lingerie or a T-shirt that covers the device. SEEK IMMEDIATE MEDICAL CARE IF:  You notice a change in the size or color of the stoma, especially if it becomes very red, purple, black, or pale white.  You have bloody stools or bleeding from the stoma.  You have abdominal pain, nausea, vomiting, or bloating.  There is anything unusual protruding from the stoma.  You have irritation or red skin around the stoma.  No stool is passing from the stoma.  You have diarrhea (requiring more frequent than normal pouch emptying).   This information is not intended to replace advice given to you by your health care provider. Make sure you discuss any questions you have with your health care provider.   Document Released: 10/02/2003 Document Revised: 12/22/2011 Document Reviewed: 02/26/2011 Elsevier Interactive Patient Education 2016 [employer]  Dressing Change A dressing is a material placed  over wounds. It keeps the wound clean, dry, and protected from further injury.  BEFORE YOU BEGIN  Get  your supplies together. Things you may need include:  Salt solution (saline).  Flexible gauze bandage.  Medicated cream.  Tape.  Gloves.  Belly (abdominal) pads.  Gauze squares.  Plastic bags.  Take pain medicine 30 minutes before the bandage change if you need it.  Take a shower before you do the first bandage change of the day. Put plastic wrap or a bag over the dressing. REMOVING YOUR OLD BANDAGE  Wash your hands with soap and water. Dry your hands with a clean towel.  Put on your gloves.  Remove any tape.  Remove the old bandage as told. If it sticks, put a small amount of warm water on it to loosen the bandage.  Remove any gauze or packing tape in your wound.  Take off your gloves.  Put the gloves, tape, gauze, or any packing tape in a plastic bag. CHANGING YOUR BANDAGE  Open the supplies.  Take the cap off the salt solution.  Open the gauze. Leave the gauze on the inside of the package.  Put on your gloves.  Clean your wound as told by your doctor.  Keep your wound dry if your doctor told you to do so.  Your doctor may tell you to do one or more of the following:  Pick up the gauze. Pour the salt solution over the gauze. Squeeze out the extra salt solution.  Put medicated cream or other medicine on your wound.  Put solution soaked gauze only in your wound, not on the skin around it.  Pack your wound loosely.  Put dry gauze on your wound.  Put belly pads over the dry gauze if your bandages soak through.  Tape the bandage in place so it will not fall off. Do not wrap the tape all the way around your arm or leg.  Wrap the bandage with the flexible gauze bandage as told by your doctor.  Take off your gloves. Put them in the plastic bag with the old bandage. Tie the bag shut and throw it away.  Keep the bandage clean and dry.  Wash your hands. GET HELP RIGHT AWAY IF:   Your skin around the wound looks red.  Your wound feels more tender or  sore.  You see yellowish-white fluid (pus) in the wound.  Your wound smells bad.  You have a fever.  Your skin around the wound has a red rash that itches and burns.  You see black or yellow skin in your wound that was not there before.  You feel sick to your stomach (nauseous), throw up (vomit), and feel very tired.   This information is not intended to replace advice given to you by your health care provider. Make sure you discuss any questions you have with your health care provider.   Document Released: 12/26/2008 Document Revised: 10/20/2014 Document Reviewed: 08/10/2011 Elsevier Interactive Patient Education [employer]

## 2016-06-05 NOTE — Progress Notes (Signed)
11 Days Post-Op  Subjective: Pain control: Zofran bid, fentanyl 75 mg IV. 50 mcg Duragesic fentanyl patch recorded, lidocaine patch, compazine x 1, Urelle qid, B&O supp x1,mirabegron ER x1, Viocodin 4 yesterday and 2 this Am. She says she can go home if we give her the pain med she had at 8AM and 6 PM, I cannot figure out what it is she is asking for.  She did like The B&O supp, she said she slept all evening.  Her mother is wanting to take her home.  I told her again she is still orthostatic and told her she needs more fluids.  She ask for B&O for increased voiding and she is back on Miralax.   Objective: Vital signs in last 24 hours: Temp:  [98.7 F (37.1 C)-99.1 F (37.3 C)] 98.7 F (37.1 C) (08/24 0535) Pulse Rate:  [101-115] 101 (08/24 0535) Resp:  [14-16] 16 (08/24 0535) BP: (102-107)/(71-76) 102/71 (08/24 0535) SpO2:  [99 %] 99 % (08/24 0535) Weight:  [55.6 kg (122 lb 9.6 oz)] 55.6 kg (122 lb 9.6 oz) (08/24 0535) Last BM Date: 06/03/16 1010 PO Urine not recorded Stool 500 recorded Afebrle, VSS  Still orthostatic HR up to 143 yesterday with standing from 103 lying Wt 58 kg on 05/13/16 last two days 55.6 KG NO labs will recheck in AM Last CT 05/29/16 Intake/Output from previous day: 08/23 0701 - 08/24 0700 In: 1060 [P.O.:1010; IV Piggyback:50] Out: 500 [Stool:500] Intake/Output this shift: Total I/O In: 460 [P.O.:410; IV Piggyback:50] Out: -   General appearance: alert, cooperative and no distress Resp: clear to auscultation bilaterally GI: soft, Lidoderm patch on.  ostomy working well, I looked at the wound yesterday and it is clean and healing well.    Lab Results:   Recent Labs  06/03/16 0500  WBC 9.0  HGB 9.1*  HCT 27.5*  PLT 458*    BMET  Recent Labs  06/03/16 0500  NA 135  K 3.6  CL 101  CO2 28  GLUCOSE 97  BUN 13  CREATININE 0.79  CALCIUM 9.2   PT/INR No results for input(s): LABPROT, INR in the last 72 hours.   Recent Labs Lab 05/29/16 1550  06/01/16 1045  AST 14* 18  ALT 7* 8*  ALKPHOS 113 99  BILITOT 0.3 0.2*  PROT 7.5 8.2*  ALBUMIN 2.4* 2.8*     Lipase     Component Value Date/Time   LIPASE 26 05/13/2016 0953     Studies/Results: No results found.  Medications: . cefoTEtan (CEFOTAN) IV  2 g Intravenous Q12H  . doxycycline  100 mg Oral BID WC   And  . ondansetron  8 mg Oral BID  . famotidine  20 mg Oral BID  . feeding supplement (ENSURE ENLIVE)  237 mL Oral BID BM  . fentaNYL  50 mcg Transdermal Q48H  . heparin subcutaneous  5,000 Units Subcutaneous Q8H  . lidocaine  2 patch Transdermal Q24H  . lip balm  1 application Topical BID  . mirabegron ER  50 mg Oral Daily  . polyethylene glycol  17 g Oral Daily  . saccharomyces boulardii  250 mg Oral BID  . URELLE  1 tablet Oral QID  . 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 Complex recurrent left tubo-ovarian abscess, colocutaneous fistula  Exploratory laparotomy, drainage of abscess, partial descending colectomy,colostomy, incidental appendectomy, 05/15/16, [name] POD 21 Cystoscopy with left retrograde pyelogram including interpretation. Left internal/external open-ended ureteral stent placement 05/15/16,[name] Left ureteral obstruction:CYSTOSCOPY WITH RETROGRADE PYELOGRAM/LEFT URETERAL STENT PLACEMENT (Left); 05/25/16,[name]  IDconsult [name] 04/22/16 [name]- Invanz x 6 days; ceftriaxone therapy initiated 04/28/16 with planned course of 21 days. Currently doxycycline 100 mg twice a day/cefotetan 2 g IV every 12 - day 14/20 Pain control:  See drugs used yesterday at the top ZOX:WRUEAVWFEN:Regular diet UJW:[id number]:Heparin   Plan:  I will talk with Dr. Phillips OdorGolding and Dr. Magnus IvanBlackman and make a decision on discharge and exactly  what to send her on.      LOS: 23 days    Joylene Wescott 06/05/2016 [phone]

## 2016-06-05 NOTE — Progress Notes (Addendum)
CM received notification pt to discharge. Cm notified [facility] rep, Karenof change of address:   Pt will be staying with sister [address] [place] [id number] and can be reached at her same cell number.  CM [facility].  NO other CM needs were communicated.

## 2016-06-05 NOTE — Progress Notes (Signed)
[facility]; pt is NOT [facility] eligible (for future knowledge IF pt is eligible, there will be a highlighted green "[facility] ACO pt: [facility]CO registry" on dashboard EPIC chart.

## 2016-06-06 ENCOUNTER — Other Ambulatory Visit: Payer: Self-pay | Admitting: General Surgery

## 2016-06-06 DIAGNOSIS — K632 Fistula of intestine: Secondary | ICD-10-CM

## 2016-06-09 ENCOUNTER — Encounter (HOSPITAL_COMMUNITY): Payer: Self-pay

## 2016-06-09 ENCOUNTER — Emergency Department (HOSPITAL_COMMUNITY): Payer: Commercial Managed Care - PPO

## 2016-06-09 ENCOUNTER — Emergency Department (HOSPITAL_COMMUNITY)
Admission: EM | Admit: 2016-06-09 | Discharge: 2016-06-10 | Disposition: A | Discharge status: Home / Self Care | Payer: Commercial Managed Care - PPO | Attending: Emergency Medicine | Admitting: Emergency Medicine

## 2016-06-09 DIAGNOSIS — R109 Unspecified abdominal pain: Secondary | ICD-10-CM

## 2016-06-09 DIAGNOSIS — Z79899 Other long term (current) drug therapy: Secondary | ICD-10-CM | POA: Diagnosis not present

## 2016-06-09 DIAGNOSIS — R319 Hematuria, unspecified: Secondary | ICD-10-CM | POA: Insufficient documentation

## 2016-06-09 DIAGNOSIS — R1084 Generalized abdominal pain: Secondary | ICD-10-CM | POA: Insufficient documentation

## 2016-06-09 DIAGNOSIS — R21 Rash and other nonspecific skin eruption: Secondary | ICD-10-CM | POA: Diagnosis not present

## 2016-06-09 DIAGNOSIS — Z87891 Personal history of nicotine dependence: Secondary | ICD-10-CM | POA: Diagnosis not present

## 2016-06-09 LAB — COMPREHENSIVE METABOLIC PANEL
ALT: 14 U/L (ref 14–54)
AST: 25 U/L (ref 15–41)
Albumin: 2.7 g/dL — ABNORMAL LOW (ref 3.5–5.0)
Alkaline Phosphatase: 93 U/L (ref 38–126)
Anion gap: 8 (ref 5–15)
BUN: 7 mg/dL (ref 6–20)
CO2: 26 mmol/L (ref 22–32)
Calcium: 9.3 mg/dL (ref 8.9–10.3)
Chloride: 103 mmol/L (ref 101–111)
Creatinine, Ser: 0.71 mg/dL (ref 0.44–1.00)
GFR calc Af Amer: >60 mL/min (ref >60)
GFR calc non Af Amer: >60 mL/min (ref >60)
Glucose, Bld: 102 mg/dL — ABNORMAL HIGH (ref 65–99)
Potassium: 3.7 mmol/L (ref 3.5–5.1)
Sodium: 137 mmol/L (ref 135–145)
Total Bilirubin: 0.5 mg/dL (ref 0.3–1.2)
Total Protein: 7.5 g/dL (ref 6.5–8.1)

## 2016-06-09 LAB — URINALYSIS, ROUTINE W REFLEX MICROSCOPIC
Bilirubin Urine: NEGATIVE
Glucose, UA: NEGATIVE mg/dL
Ketones, ur: NEGATIVE mg/dL
Nitrite: NEGATIVE
Protein, ur: NEGATIVE mg/dL
Specific Gravity, Urine: 1.005 (ref 1.005–1.030)
pH: 7.5 (ref 5.0–8.0)

## 2016-06-09 LAB — CBC WITH DIFFERENTIAL/PLATELET
Basophils Absolute: 0 10*3/uL (ref 0.0–0.1)
Basophils Relative: 0 %
Eosinophils Absolute: 0.2 10*3/uL (ref 0.0–0.7)
Eosinophils Relative: 4 %
HCT: 30.2 % — ABNORMAL LOW (ref 36.0–46.0)
Hemoglobin: 10.3 g/dL — ABNORMAL LOW (ref 12.0–15.0)
Lymphocytes Relative: 10 %
Lymphs Abs: 0.4 10*3/uL — ABNORMAL LOW (ref 0.7–4.0)
MCH: 29.3 pg (ref 26.0–34.0)
MCHC: 34.1 g/dL (ref 30.0–36.0)
MCV: 86 fL (ref 78.0–100.0)
Monocytes Absolute: 0.7 10*3/uL (ref 0.1–1.0)
Monocytes Relative: 16 %
Neutro Abs: 3.1 10*3/uL (ref 1.7–7.7)
Neutrophils Relative %: 70 %
Platelets: 326 10*3/uL (ref 150–400)
RBC: 3.51 MIL/uL — ABNORMAL LOW (ref 3.87–5.11)
RDW: 16.2 % — ABNORMAL HIGH (ref 11.5–15.5)
WBC: 4.4 10*3/uL (ref 4.0–10.5)

## 2016-06-09 LAB — URINE MICROSCOPIC-ADD ON: BACTERIA UA: NONE SEEN

## 2016-06-09 LAB — I-STAT CG4 LACTIC ACID, ED
Lactic Acid, Venous: 0.44 mmol/L — ABNORMAL LOW (ref 0.5–1.9)
Lactic Acid, Venous: 0.92 mmol/L (ref 0.5–1.9)

## 2016-06-09 MED ORDER — IOPAMIDOL (ISOVUE-300) INJECTION 61%
100.0000 mL | Freq: Once | INTRAVENOUS | Status: AC | PRN
  Administered 2016-06-09: 100 mL via INTRAVENOUS

## 2016-06-09 MED ORDER — HYDROMORPHONE HCL 1 MG/ML IJ SOLN
1.0000 mg | Freq: Once | INTRAMUSCULAR | Status: AC
  Administered 2016-06-09: 1 mg via INTRAVENOUS
  Filled 2016-06-09: qty 1

## 2016-06-09 MED ORDER — HYDROMORPHONE HCL 1 MG/ML IJ SOLN
0.5000 mg | Freq: Once | INTRAMUSCULAR | Status: AC
  Administered 2016-06-09: 0.5 mg via INTRAVENOUS
  Filled 2016-06-09: qty 1

## 2016-06-09 MED ORDER — SODIUM CHLORIDE 0.9 % IV BOLUS (SEPSIS)
1000.0000 mL | Freq: Once | INTRAVENOUS | Status: AC
  Administered 2016-06-09: 1000 mL via INTRAVENOUS

## 2016-06-09 MED ORDER — ACETAMINOPHEN 325 MG PO TABS
650.0000 mg | ORAL_TABLET | Freq: Once | ORAL | Status: AC
  Administered 2016-06-09: 650 mg via ORAL
  Filled 2016-06-09: qty 2

## 2016-06-09 NOTE — ED Provider Notes (Signed)
[facility]-EMERGENCY DEPT Provider Note   CSN: [id number] Arrival date & time: 06/09/16  4098  By signing my name below, I, Placido Sou, attest that this documentation has been prepared under the direction and in the presence of Raeford Razor, MD. Electronically Signed: Placido Sou, ED Scribe. 06/09/16. 8:30 PM.   History   Chief Complaint Chief Complaint  Patient presents with  . Fever  . Hematuria  . Post-op Problem    HPI HPI Comments: Sheila Willis is a 30 y.o. female who presents to the Emergency Department complaining of constant, moderate, central abd pain onset this morning. Pt had multiple abd procedures performed in early August and was d/c on 06/05/2016. She states her abd pain is overlying her surgical scar. She reports associated hematuria, left flank pain, mild RUE rash, a mild fever, increased urinary frequency. She is taking cefotetan through her PICC and doxycycline orally. Pt takes morphine for pain management with her last dose this morning. Pt confirms her listed allergy. She denies SOB, CP, cough, redness, dysuria or blood noted to her colostomy bag.   The history is provided by the patient. No language interpreter was used.    Past Medical History:  Diagnosis Date  . Depression   . Gestational diabetes   . Headache(784.0)   . PCOS (polycystic ovarian syndrome)   . Postpartum care following vaginal delivery (2/1) 11/13/2012  . SVD (spontaneous vaginal delivery) 11/13/2012    Patient Active Problem List   Diagnosis Date Noted  . 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 Colostomy in place [facility]) 06/01/2016  . Hydronephrosis left with infection s/p ureteral stenting 05/25/2016 06/01/2016  . 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 Anxiety state 05/27/2016  . Pain due to ureteral stent (HCC) 05/27/2016  . Pelvic abscess in female 05/15/2016  . TOA (tubo-ovarian abscess) 04/19/2016  . Pelvic pain in  female 04/18/2016  . PCOS (polycystic ovarian syndrome) 11/14/2012  . Gestational diabetes mellitus, class A1 11/14/2012    Past Surgical History:  Procedure Laterality Date  . APPENDECTOMY  05/15/2016   Procedure: incidental APPENDECTOMY;  Surgeon: Avel Peace, MD;  Location: [facility] ORS;  Service: General;;  . COLECTOMY WITH COLOSTOMY CREATION/HARTMANN PROCEDURE N/A 05/15/2016   Procedure: partial  COLECTOMY AND  COLOSTOMY;  Surgeon: Avel Peace, MD;  Location: [facility];  Service: General;  Laterality: N/A;  . CYSTOSCOPY W/ URETERAL STENT PLACEMENT Left 05/15/2016   Procedure: CYSTOSCOPY;  Surgeon: Ihor Gully, MD;  Location: [facility];  Service: Urology;  Laterality: Left;  . CYSTOSCOPY W/ URETERAL STENT PLACEMENT Left 05/25/2016   Procedure: CYSTOSCOPY WITH RETROGRADE PYELOGRAM/LEFT URETERAL STENT PLACEMENT;  Surgeon: [name], MD;  Location: [facility];  Service: Urology;  Laterality: Left;  . INCISION AND DRAINAGE ABSCESS N/A 05/15/2016   Procedure: DRAINAGE ABSCESS OF INTRA ABDOMINAL ABSCESS;  Surgeon: Avel Peace, MD;  Location: [facility];  Service: General;  Laterality: N/A;  . LAPAROTOMY N/A 05/15/2016   Procedure: EXPLORATORY LAPAROTOMY;  Surgeon: Avel Peace, MD;  Location: [facility];  Service: General;  Laterality: N/A;  . NO PAST SURGERIES      OB History    Gravida Para Term Preterm AB Living   3 2 2   1 2    SAB TAB Ectopic Multiple Live Births       1   2       Home Medications    Prior to Admission medications   Medication Sig Start Date End Date Taking? Authorizing Provider  acetaminophen (TYLENOL) 325 MG tablet Take two tablets every 6 hours as needed for pain.  This is your first line treatment for pain.  This is regular generic Tylenol, you can buy at any store. 05/01/16   Sherrie GeorgeWillard Jennings, PA-C  cefoTEtan 2 g in dextrose 5 % 50 mL Inject 2 g into the vein every 12 (twelve) hours. 06/05/16 06/15/16  Sherrie GeorgeWillard Jennings, PA-C  clonazePAM (KLONOPIN) 0.5 MG tablet Take 1  tablet (0.5 mg total) by mouth 2 (two) times daily as needed (sleep anxiety). 06/05/16   Sherrie GeorgeWillard Jennings, PA-C  doxycycline (VIBRA-TABS) 100 MG tablet Take 1 tablet (100 mg total) by mouth 2 (two) times daily with a meal. 06/05/16   Sherrie GeorgeWillard Jennings, PA-C  fentaNYL (DURAGESIC - DOSED MCG/HR) 50 MCG/HR Place 1 patch (50 mcg total) onto the skin every other day. 06/05/16   Sherrie GeorgeWillard Jennings, PA-C  lidocaine (LIDODERM) 5 % Place 2 patches onto the skin daily. Remove & Discard patch within 12 hours or as directed by MD 06/05/16 06/12/16  Sherrie GeorgeWillard Jennings, PA-C  mirabegron ER (MYRBETRIQ) 50 MG TB24 tablet Take 1 tablet (50 mg total) by mouth daily. 06/05/16   Sherrie GeorgeWillard Jennings, PA-C  morphine (MSIR) 15 MG tablet Take 1 tablet (15 mg total) by mouth every 4 (four) hours as needed for severe pain. 06/05/16   Sherrie GeorgeWillard Jennings, PA-C  ondansetron (ZOFRAN-ODT) 8 MG disintegrating tablet Take 1 tablet (8 mg total) by mouth 2 (two) times daily. 06/05/16   Sherrie GeorgeWillard Jennings, PA-C  polyethylene glycol [facility](MIRALAX / Ethelene HalGLYCOLAX) packet You can follow package instructions and use if you think your constipated.  Right now you do not really need it.  The Pain medicine can cause you to be constipated.  You can buy at any drug store over the counter. 06/05/16   Sherrie GeorgeWillard Jennings, PA-C  saccharomyces boulardii (FLORASTOR) 250 MG capsule You can buy this at the drug store, and use to replace good bacteria while on antibiotics.  i would use this for at least 2 weeks after you finish your antibiotics. Patient not taking: Reported on 05/13/2016 05/01/16   Sherrie GeorgeWillard Jennings, PA-C  URELLE (URELLE/URISED) 81 MG TABS tablet Take 1 tablet (81 mg total) by mouth 4 (four) times daily. 06/05/16   Sherrie GeorgeWillard Jennings, PA-C    Family History Family History  Problem Relation Age of Onset  . Cancer Other   . Hypertension Other   . Stroke Other   . Heart attack Other   . Heart disease Mother   . Hypertension Mother   . Cancer Mother     ovarian    Social  History Social History  Substance Use Topics  . Smoking status: Former Smoker    Packs/day: 0.50    Types: Cigarettes    Quit date: 04/11/2016  . Smokeless tobacco: Never Used  . Alcohol use No     Allergies   Latex  Review of Systems Review of Systems  Constitutional: Positive for fever.  Respiratory: Negative for cough and shortness of breath.   Cardiovascular: Negative for chest pain.  Gastrointestinal: Positive for abdominal pain. Negative for blood in stool.  Genitourinary: Positive for flank pain and hematuria. Negative for dysuria.  Skin: Positive for rash. Negative for color change.  All other systems reviewed and are negative.  Physical Exam Updated Vital Signs BP 104/79 (BP Location: Left Arm)   Pulse (!) 122   Temp 99.1 F (37.3 C) (Oral)   Resp 18   SpO2 99%   Physical Exam  Constitutional: She is oriented to person, place, and time. She appears well-developed. She appears distressed.  HENT:  Head: Normocephalic.  Eyes: EOM are normal.  Neck: Normal range of motion.  Pulmonary/Chest: Effort normal.  Abdominal: She exhibits no distension. There is tenderness.  Mild diffuse tenderness. Midline surgical scar wound noted which appears to be healing well with healthy granulation tissue. Ostomy appears healthy, soft and has blue appearing stool noted.   Musculoskeletal: Normal range of motion.  Neurological: She is alert and oriented to person, place, and time.  Psychiatric: She has a normal mood and affect.  Nursing note and vitals reviewed.  ED Treatments / Results  Labs (all labs ordered are listed, but only abnormal results are displayed) Labs Reviewed  COMPREHENSIVE METABOLIC PANEL - Abnormal; Notable for the following:       Result Value   Glucose, Bld 102 (*)    Albumin 2.7 (*)    All other components within normal limits  URINALYSIS, ROUTINE W REFLEX MICROSCOPIC (NOT AT [facility]) - Abnormal; Notable for the following:    APPearance CLOUDY (*)    Hgb  urine dipstick LARGE (*)    Leukocytes, UA SMALL (*)    All other components within normal limits  CBC WITH DIFFERENTIAL/PLATELET - Abnormal; Notable for the following:    RBC 3.51 (*)    Hemoglobin 10.3 (*)    HCT 30.2 (*)    RDW 16.2 (*)    Lymphs Abs 0.4 (*)    All other components within normal limits  URINE MICROSCOPIC-ADD ON - Abnormal; Notable for the following:    Squamous Epithelial / LPF 0-5 (*)    All other components within normal limits  CULTURE, BLOOD (ROUTINE X 2)  CULTURE, BLOOD (ROUTINE X 2)  URINE CULTURE  I-STAT CG4 LACTIC ACID, ED  I-STAT CG4 LACTIC ACID, ED    EKG  EKG Interpretation None       Radiology Dg Chest 2 View  Result Date: 06/09/2016 CLINICAL DATA:  Patient states she had abdominal surgery several weeks ago and has colostomy and open abdominal wound, she is having increased abdominal pain and fever this pm, pain is upper abdomen EXAM: CHEST  2 VIEW COMPARISON:  05/14/2016 FINDINGS: Right PICC catheter with tip over the low SVC region. No pneumothorax. Normal heart size and pulmonary vascularity. No focal airspace disease or consolidation in the lungs. No blunting of costophrenic angles. No pneumothorax. Mediastinal contours appear intact. IMPRESSION: No active cardiopulmonary disease. Electronically Signed   By: Burman Nieves M.D.   On: 06/09/2016 22:06   Ct Abdomen Pelvis W Contrast  Result Date: 06/09/2016 CLINICAL DATA:  Constant mid abdominal pain, onset this morning. Recent hospital discharge after abdominal procedures. EXAM: CT ABDOMEN AND PELVIS WITH CONTRAST TECHNIQUE: Multidetector CT imaging of the abdomen and pelvis was performed using the standard protocol following bolus administration of intravenous contrast. CONTRAST:  ISOVUE-300 IOPAMIDOL (ISOVUE-300) INJECTION 61% COMPARISON:  05/29/2016, 05/22/2016 FINDINGS: Lower chest:  No significant abnormality Hepatobiliary: There are normal appearances of the liver, gallbladder and  bile ducts except for a small volume layering sludge within the gallbladder lumen. Pancreas: Normal Spleen: Normal Adrenals/Urinary Tract: Satisfactorily positioned left ureteral stent. 2 mm calculus of the lower pole right renal collecting system. No significant hydronephrosis. Unremarkable appearances of urinary bladder. Stomach/Bowel: Unremarkable appearances of the left lower quadrant colostomy. Generous colonic stool volume. There is abnormal dilatation of small bowel without caliber transition and without focal inflammatory change. The stomach is unremarkable. Vascular/Lymphatic:  The abdominal aorta is normal in caliber. There is no atherosclerotic calcification. There is no adenopathy in the abdomen or pelvis. Reproductive: Rim enhancing tubular structure in the left adnexal region is unchanged or slightly reduced in size, measuring 1.0 x 3.5 cm and previously measuring 1.0 x 4.2 cm. Uterus and adnexal structures are otherwise unremarkable. Other: Minimal free pelvic fluid.  No extraluminal air. Musculoskeletal: Low midline abdominal wound, without evidence of drainable collection. IMPRESSION: 1. New or worsened small bowel dilatation without transition point. No focal small bowel inflammation. This may be reactive. No evidence of a significant bowel obstruction. No perforation. 2. Unchanged or slightly decreased rim enhancing tubular structure in the left adnexal region. 3. No new or enlarging collections in the abdomen or pelvis. Small volume free fluid. 4. Satisfactorily positioned left ureteral stent. Right nephrolithiasis. Electronically Signed   By: Ellery Plunk M.D.   On: 06/09/2016 22:37    Procedures Procedures  DIAGNOSTIC STUDIES: Oxygen Saturation is 99% on RA, normal by my interpretation.    COORDINATION OF CARE: 8:29 PM Discussed next steps with pt. Pt verbalized understanding and is agreeable with the plan.    Medications Ordered in ED Medications - No data to  display   Initial Impression / Assessment and Plan / ED Course  I have reviewed the triage vital signs and the nursing notes.  Pertinent labs & imaging results that were available during my care of the patient were reviewed by me and considered in my medical decision making (see chart for details).  Clinical Course    30 year old female with fever and left sided abdominal pain. She is afebrile emergency room. Leukocytosis has resolved. Normal lactic acid. Normotensive. CT abdomen and pelvis with stable to decreasing pelvic collection. Her abdominal exam is significant for some tenderness but this seems appropriate given her recent medical problems and procedures. Her ostomy and midline surgical wound appeared healthy. UA with blood but otherwise no signs of infection. Chest x-ray is clear. Her PICC site looks good. Potentially be ureteral stent colic. Final Clinical Impressions(s) / ED Diagnoses   Final diagnoses:  None    New Prescriptions New Prescriptions   No medications on file     Raeford Razor, MD 06/09/16 [phone]

## 2016-06-09 NOTE — ED Notes (Signed)
Patient transported to X-ray 

## 2016-06-09 NOTE — ED Triage Notes (Addendum)
Pt c/o 101.0 temporal temp today at home, hematuria, and increased 8/10 surgical site pain. Pt recently had a colostomy placed 8/3 and was d/c'd from hospital 8/24. Pt arrives tachycardic and afebrile. Pt denies taking antipyretics at home. Pt A+OX4, speaking in complete sentences. Pt denies SOB. Pt denies CP.

## 2016-06-09 NOTE — ED Notes (Signed)
Bed: WA09 Expected date:  Expected time:  Means of arrival:  Comments: Triage 2 

## 2016-06-11 ENCOUNTER — Telehealth: Payer: Self-pay | Admitting: Pharmacist Clinician (PhC)/ Clinical Pharmacy Specialist

## 2016-06-11 LAB — URINE CULTURE: Culture: NO GROWTH

## 2016-06-11 NOTE — Telephone Encounter (Signed)
Called [facility] to tell them to cont cefotetan for at least another 2 wks. Pt will come in to see Dr. Drue SecondSnider soon.

## 2016-06-12 ENCOUNTER — Inpatient Hospital Stay: Payer: Commercial Managed Care - PPO | Admitting: Internal Medicine

## 2016-06-12 DIAGNOSIS — D72829 Elevated white blood cell count, unspecified: Secondary | ICD-10-CM

## 2016-06-13 ENCOUNTER — Inpatient Hospital Stay: Admission: RE | Admit: 2016-06-13 | Payer: Commercial Managed Care - PPO | Source: Ambulatory Visit

## 2016-06-14 LAB — CULTURE, BLOOD (ROUTINE X 2)
Culture: NO GROWTH
Culture: NO GROWTH

## 2016-06-17 ENCOUNTER — Other Ambulatory Visit: Payer: Commercial Managed Care - PPO

## 2016-06-19 ENCOUNTER — Inpatient Hospital Stay (HOSPITAL_COMMUNITY)
Admission: EM | Admit: 2016-06-19 | Discharge: 2016-06-26 | DRG: 607 | Disposition: A | Discharge status: Home Health | Payer: Commercial Managed Care - PPO | Attending: Family Medicine | Admitting: Family Medicine

## 2016-06-19 ENCOUNTER — Encounter (HOSPITAL_COMMUNITY): Payer: Self-pay | Admitting: *Deleted

## 2016-06-19 ENCOUNTER — Emergency Department (HOSPITAL_COMMUNITY): Payer: Commercial Managed Care - PPO

## 2016-06-19 DIAGNOSIS — L538 Other specified erythematous conditions: Secondary | ICD-10-CM | POA: Diagnosis present

## 2016-06-19 DIAGNOSIS — L270 Generalized skin eruption due to drugs and medicaments taken internally: Principal | ICD-10-CM | POA: Diagnosis present

## 2016-06-19 DIAGNOSIS — N132 Hydronephrosis with renal and ureteral calculous obstruction: Secondary | ICD-10-CM | POA: Diagnosis present

## 2016-06-19 DIAGNOSIS — R000 Tachycardia, unspecified: Secondary | ICD-10-CM | POA: Diagnosis present

## 2016-06-19 DIAGNOSIS — Z809 Family history of malignant neoplasm, unspecified: Secondary | ICD-10-CM

## 2016-06-19 DIAGNOSIS — Z8249 Family history of ischemic heart disease and other diseases of the circulatory system: Secondary | ICD-10-CM

## 2016-06-19 DIAGNOSIS — Z87891 Personal history of nicotine dependence: Secondary | ICD-10-CM

## 2016-06-19 DIAGNOSIS — T7840XA Allergy, unspecified, initial encounter: Secondary | ICD-10-CM | POA: Diagnosis not present

## 2016-06-19 DIAGNOSIS — N7093 Salpingitis and oophoritis, unspecified: Secondary | ICD-10-CM | POA: Diagnosis present

## 2016-06-19 DIAGNOSIS — R21 Rash and other nonspecific skin eruption: Secondary | ICD-10-CM | POA: Diagnosis not present

## 2016-06-19 DIAGNOSIS — Z933 Colostomy status: Secondary | ICD-10-CM

## 2016-06-19 DIAGNOSIS — R6510 Systemic inflammatory response syndrome (SIRS) of non-infectious origin without acute organ dysfunction: Secondary | ICD-10-CM | POA: Diagnosis present

## 2016-06-19 DIAGNOSIS — E871 Hypo-osmolality and hyponatremia: Secondary | ICD-10-CM | POA: Diagnosis present

## 2016-06-19 DIAGNOSIS — Z9049 Acquired absence of other specified parts of digestive tract: Secondary | ICD-10-CM

## 2016-06-19 DIAGNOSIS — T361X5A Adverse effect of cephalosporins and other beta-lactam antibiotics, initial encounter: Secondary | ICD-10-CM | POA: Diagnosis present

## 2016-06-19 DIAGNOSIS — N1330 Unspecified hydronephrosis: Secondary | ICD-10-CM | POA: Diagnosis present

## 2016-06-19 DIAGNOSIS — N739 Female pelvic inflammatory disease, unspecified: Secondary | ICD-10-CM | POA: Diagnosis present

## 2016-06-19 DIAGNOSIS — Z8632 Personal history of gestational diabetes: Secondary | ICD-10-CM

## 2016-06-19 DIAGNOSIS — L299 Pruritus, unspecified: Secondary | ICD-10-CM | POA: Diagnosis present

## 2016-06-19 DIAGNOSIS — D72829 Elevated white blood cell count, unspecified: Secondary | ICD-10-CM | POA: Diagnosis present

## 2016-06-19 LAB — COMPREHENSIVE METABOLIC PANEL
ALT: 14 U/L (ref 14–54)
ANION GAP: 16 — AB (ref 5–15)
AST: 21 U/L (ref 15–41)
Albumin: 2.7 g/dL — ABNORMAL LOW (ref 3.5–5.0)
Alkaline Phosphatase: 83 U/L (ref 38–126)
BUN: 12 mg/dL (ref 6–20)
CHLORIDE: 93 mmol/L — AB (ref 101–111)
CO2: 22 mmol/L (ref 22–32)
Calcium: 8.4 mg/dL — ABNORMAL LOW (ref 8.9–10.3)
Creatinine, Ser: 0.92 mg/dL (ref 0.44–1.00)
GFR calc non Af Amer: >60 mL/min (ref >60)
Glucose, Bld: 113 mg/dL — ABNORMAL HIGH (ref 65–99)
POTASSIUM: 3.5 mmol/L (ref 3.5–5.1)
SODIUM: 131 mmol/L — AB (ref 135–145)
Total Bilirubin: 0.7 mg/dL (ref 0.3–1.2)
Total Protein: 7.2 g/dL (ref 6.5–8.1)

## 2016-06-19 LAB — BASIC METABOLIC PANEL
ANION GAP: 9 (ref 5–15)
BUN: 9 mg/dL (ref 6–20)
CHLORIDE: 102 mmol/L (ref 101–111)
CO2: 23 mmol/L (ref 22–32)
Calcium: 7.9 mg/dL — ABNORMAL LOW (ref 8.9–10.3)
Creatinine, Ser: 0.71 mg/dL (ref 0.44–1.00)
GFR calc non Af Amer: >60 mL/min (ref >60)
Glucose, Bld: 114 mg/dL — ABNORMAL HIGH (ref 65–99)
POTASSIUM: 3.9 mmol/L (ref 3.5–5.1)
SODIUM: 134 mmol/L — AB (ref 135–145)

## 2016-06-19 LAB — PHOSPHORUS: Phosphorus: 2.4 mg/dL — ABNORMAL LOW (ref 2.5–4.6)

## 2016-06-19 LAB — CBC WITH DIFFERENTIAL/PLATELET
BASOS PCT: 2 %
Basophils Absolute: 0.4 10*3/uL — ABNORMAL HIGH (ref 0.0–0.1)
EOS ABS: 1.1 10*3/uL — AB (ref 0.0–0.7)
EOS PCT: 6 %
HCT: 35.3 % — ABNORMAL LOW (ref 36.0–46.0)
HEMOGLOBIN: 11.9 g/dL — AB (ref 12.0–15.0)
LYMPHS PCT: 24 %
Lymphs Abs: 4.6 10*3/uL — ABNORMAL HIGH (ref 0.7–4.0)
MCH: 28.6 pg (ref 26.0–34.0)
MCHC: 33.7 g/dL (ref 30.0–36.0)
MCV: 84.9 fL (ref 78.0–100.0)
Monocytes Absolute: 2.5 10*3/uL — ABNORMAL HIGH (ref 0.1–1.0)
Monocytes Relative: 13 %
NEUTROS PCT: 55 %
Neutro Abs: 10.5 10*3/uL — ABNORMAL HIGH (ref 1.7–7.7)
Platelets: 330 10*3/uL (ref 150–400)
RBC: 4.16 MIL/uL (ref 3.87–5.11)
RDW: 16.7 % — ABNORMAL HIGH (ref 11.5–15.5)
WBC: 19.1 10*3/uL — ABNORMAL HIGH (ref 4.0–10.5)

## 2016-06-19 LAB — C DIFFICILE QUICK SCREEN W PCR REFLEX
C DIFFICLE (CDIFF) ANTIGEN: NEGATIVE
C Diff interpretation: NOT DETECTED
C Diff toxin: NEGATIVE

## 2016-06-19 LAB — I-STAT BETA HCG BLOOD, ED (MC, WL, AP ONLY)

## 2016-06-19 LAB — MAGNESIUM: Magnesium: 2.1 mg/dL (ref 1.7–2.4)

## 2016-06-19 MED ORDER — SODIUM CHLORIDE 0.9 % IV SOLN
25.0000 mg | INTRAVENOUS | Status: DC | PRN
  Administered 2016-06-20 (×2): 25 mg via INTRAVENOUS
  Filled 2016-06-19 (×5): qty 0.5

## 2016-06-19 MED ORDER — ENOXAPARIN SODIUM 40 MG/0.4ML ~~LOC~~ SOLN
40.0000 mg | SUBCUTANEOUS | Status: DC
  Administered 2016-06-19 – 2016-06-20 (×2): 40 mg via SUBCUTANEOUS
  Filled 2016-06-19 (×6): qty 0.4

## 2016-06-19 MED ORDER — POTASSIUM CHLORIDE IN NACL 20-0.9 MEQ/L-% IV SOLN
INTRAVENOUS | Status: DC
  Administered 2016-06-19 – 2016-06-20 (×4): via INTRAVENOUS
  Filled 2016-06-19 (×5): qty 1000

## 2016-06-19 MED ORDER — FENTANYL CITRATE (PF) 100 MCG/2ML IJ SOLN
50.0000 ug | Freq: Once | INTRAMUSCULAR | Status: AC
  Administered 2016-06-19: 50 ug via INTRAVENOUS
  Filled 2016-06-19: qty 2

## 2016-06-19 MED ORDER — METHYLPREDNISOLONE SODIUM SUCC 125 MG IJ SOLR
80.0000 mg | Freq: Four times a day (QID) | INTRAMUSCULAR | Status: DC
  Administered 2016-06-19 – 2016-06-23 (×14): 80 mg via INTRAVENOUS
  Filled 2016-06-19 (×18): qty 2

## 2016-06-19 MED ORDER — METHYLPREDNISOLONE SODIUM SUCC 125 MG IJ SOLR
125.0000 mg | Freq: Once | INTRAMUSCULAR | Status: AC
  Administered 2016-06-19: 125 mg via INTRAVENOUS
  Filled 2016-06-19: qty 2

## 2016-06-19 MED ORDER — K PHOS MONO-SOD PHOS DI & MONO 155-852-130 MG PO TABS
500.0000 mg | ORAL_TABLET | Freq: Every day | ORAL | Status: DC
  Filled 2016-06-19: qty 2

## 2016-06-19 MED ORDER — IOPAMIDOL (ISOVUE-300) INJECTION 61%
100.0000 mL | Freq: Once | INTRAVENOUS | Status: AC | PRN
  Administered 2016-06-19: 100 mL via INTRAVENOUS

## 2016-06-19 MED ORDER — SODIUM CHLORIDE 0.9 % IV BOLUS (SEPSIS)
1000.0000 mL | Freq: Once | INTRAVENOUS | Status: AC
  Administered 2016-06-19: 1000 mL via INTRAVENOUS

## 2016-06-19 MED ORDER — URELLE 81 MG PO TABS
1.0000 | ORAL_TABLET | Freq: Four times a day (QID) | ORAL | Status: DC
  Administered 2016-06-19 – 2016-06-20 (×5): 81 mg via ORAL
  Filled 2016-06-19 (×6): qty 1

## 2016-06-19 MED ORDER — SODIUM CHLORIDE 0.9 % IV SOLN
INTRAVENOUS | Status: DC
  Administered 2016-06-19: 21:00:00 via INTRAVENOUS

## 2016-06-19 MED ORDER — ONDANSETRON HCL 4 MG/2ML IJ SOLN
4.0000 mg | Freq: Once | INTRAMUSCULAR | Status: AC
  Administered 2016-06-19: 4 mg via INTRAVENOUS
  Filled 2016-06-19: qty 2

## 2016-06-19 MED ORDER — SODIUM CHLORIDE 0.9% FLUSH
3.0000 mL | Freq: Two times a day (BID) | INTRAVENOUS | Status: DC
  Administered 2016-06-19 – 2016-06-25 (×6): 3 mL via INTRAVENOUS

## 2016-06-19 MED ORDER — SODIUM CHLORIDE 0.9% FLUSH
10.0000 mL | INTRAVENOUS | Status: DC | PRN
  Administered 2016-06-20 – 2016-06-26 (×3): 10 mL
  Filled 2016-06-19 (×3): qty 40

## 2016-06-19 MED ORDER — MORPHINE SULFATE 15 MG PO TABS
15.0000 mg | ORAL_TABLET | ORAL | Status: DC | PRN
  Administered 2016-06-19 – 2016-06-21 (×5): 15 mg via ORAL
  Filled 2016-06-19 (×5): qty 1

## 2016-06-19 MED ORDER — FENTANYL 50 MCG/HR TD PT72
50.0000 ug | MEDICATED_PATCH | TRANSDERMAL | Status: DC
  Administered 2016-06-20 – 2016-06-26 (×3): 50 ug via TRANSDERMAL
  Filled 2016-06-19 (×3): qty 1

## 2016-06-19 MED ORDER — MIRABEGRON ER 50 MG PO TB24
50.0000 mg | ORAL_TABLET | Freq: Every day | ORAL | Status: DC
  Administered 2016-06-20: 50 mg via ORAL
  Filled 2016-06-19 (×2): qty 1

## 2016-06-19 MED ORDER — FAMOTIDINE IN NACL 20-0.9 MG/50ML-% IV SOLN
20.0000 mg | Freq: Two times a day (BID) | INTRAVENOUS | Status: DC
  Administered 2016-06-19 – 2016-06-26 (×14): 20 mg via INTRAVENOUS
  Filled 2016-06-19 (×14): qty 50

## 2016-06-19 NOTE — Progress Notes (Signed)
[facility]  Patient Status:  Active pt with [facility] prior to this ED visit.  [facility] is providing the following services:  HHRN and home infusion pharmacy for home IV ABX.   [facility] hospital team will follow Ms. Pies while here to support transition home when ordered.   If patient discharges after hours, please call [phone].   Sedalia Mutaamela S Chandler 06/19/2016, 12:18 PM

## 2016-06-19 NOTE — ED Triage Notes (Signed)
Pt reports she started an abx via her PICC Sept 1st, started to have an allergic reaction from it with rash in her R arm.  Stopped the abx on the 3rd, states rash has spread all over her body.  Has taken Benadryl without relief.  She also started to have SOB x 2 days ago.

## 2016-06-19 NOTE — ED Provider Notes (Signed)
[facility]-EMERGENCY DEPT Provider Note   CSN: [id number] Arrival date & time: 06/19/16  1127     History   Chief Complaint Chief Complaint  Patient presents with  . Allergic Reaction  . Tachycardia    HPI Sheila Willis is a 30 y.o. female.  HPI  Patient presents with concern of rash, dyspnea, nausea. She has a notable history of open abdominal surgery for abscess 1 month ago. She was actually seen here again about 8 days ago, for abdominal pain. She has had following that evaluation she was started on IV antibiotics via a PICC line. Soon after starting the medication, the patient developed a diffuse irritating, itchy, rash. Since that time the rash has progressed, particularly over the past 2 days. In addition, the patient complains of worsening nausea, vomiting, as well as new dyspnea. No relief from anything.  No fever, no ongoing abdominal pain. She describes continued output into her colostomy bag, which was placed during her last surgery.   Past Medical History:  Diagnosis Date  . Depression   . Gestational diabetes   . Headache(784.0)   . PCOS (polycystic ovarian syndrome)   . Postpartum care following vaginal delivery (2/1) 11/13/2012  . SVD (spontaneous vaginal delivery) 11/13/2012    Patient Active Problem List   Diagnosis Date Noted  . Leukocytosis   . 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 Colostomy in place [facility]) 06/01/2016  . Hydronephrosis left with infection s/p ureteral stenting 05/25/2016 06/01/2016  . 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 Anxiety state 05/27/2016  . Pain due to ureteral stent (HCC) 05/27/2016  . Pelvic abscess in female 05/15/2016  . TOA (tubo-ovarian abscess) 04/19/2016  . Pelvic pain in female 04/18/2016  . PCOS (polycystic ovarian syndrome) 11/14/2012  . Gestational diabetes mellitus, class A1 11/14/2012    Past Surgical History:  Procedure Laterality Date  .  APPENDECTOMY  05/15/2016   Procedure: incidental APPENDECTOMY;  Surgeon: Avel Peace, MD;  Location: [facility] ORS;  Service: General;;  . COLECTOMY WITH COLOSTOMY CREATION/HARTMANN PROCEDURE N/A 05/15/2016   Procedure: partial  COLECTOMY AND  COLOSTOMY;  Surgeon: Avel Peace, MD;  Location: [facility];  Service: General;  Laterality: N/A;  . CYSTOSCOPY W/ URETERAL STENT PLACEMENT Left 05/15/2016   Procedure: CYSTOSCOPY;  Surgeon: Ihor Gully, MD;  Location: [facility];  Service: Urology;  Laterality: Left;  . CYSTOSCOPY W/ URETERAL STENT PLACEMENT Left 05/25/2016   Procedure: CYSTOSCOPY WITH RETROGRADE PYELOGRAM/LEFT URETERAL STENT PLACEMENT;  Surgeon: [name], MD;  Location: [facility];  Service: Urology;  Laterality: Left;  . INCISION AND DRAINAGE ABSCESS N/A 05/15/2016   Procedure: DRAINAGE ABSCESS OF INTRA ABDOMINAL ABSCESS;  Surgeon: Avel Peace, MD;  Location: [facility];  Service: General;  Laterality: N/A;  . LAPAROTOMY N/A 05/15/2016   Procedure: EXPLORATORY LAPAROTOMY;  Surgeon: Avel Peace, MD;  Location: [facility];  Service: General;  Laterality: N/A;  . NO PAST SURGERIES      OB History    Gravida Para Term Preterm AB Living   3 2 2   1 2    SAB TAB Ectopic Multiple Live Births       1   2       Home Medications    Prior to Admission medications   Medication Sig Start Date End Date Taking? Authorizing Provider  acetaminophen (TYLENOL) 325 MG tablet Take two tablets every 6 hours as needed for pain.  This is your first line treatment for pain.  This is  regular generic Tylenol, you can buy at any store. Patient not taking: Reported on 06/09/2016 05/01/16   Sherrie George, PA-C  clonazePAM (KLONOPIN) 0.5 MG tablet Take 1 tablet (0.5 mg total) by mouth 2 (two) times daily as needed (sleep anxiety). 06/05/16   Sherrie George, PA-C  doxycycline (VIBRA-TABS) 100 MG tablet Take 1 tablet (100 mg total) by mouth 2 (two) times daily with a meal. 06/05/16   Sherrie George, PA-C  fentaNYL  (DURAGESIC - DOSED MCG/HR) 50 MCG/HR Place 1 patch (50 mcg total) onto the skin every other day. Patient taking differently: Place 50 mcg onto the skin every 3 (three) days.  06/05/16   Sherrie George, PA-C  mirabegron ER (MYRBETRIQ) 50 MG TB24 tablet Take 1 tablet (50 mg total) by mouth daily. 06/05/16   Sherrie George, PA-C  morphine (MSIR) 15 MG tablet Take 1 tablet (15 mg total) by mouth every 4 (four) hours as needed for severe pain. 06/05/16   Sherrie George, PA-C  ondansetron (ZOFRAN-ODT) 8 MG disintegrating tablet Take 1 tablet (8 mg total) by mouth 2 (two) times daily. 06/05/16   Sherrie George, PA-C  polyethylene glycol [facility] Ethelene Hal) packet You can follow package instructions and use if you think your constipated.  Right now you do not really need it.  The Pain medicine can cause you to be constipated.  You can buy at any drug store over the counter. 06/05/16   Sherrie George, PA-C  saccharomyces boulardii (FLORASTOR) 250 MG capsule You can buy this at the drug store, and use to replace good bacteria while on antibiotics.  i would use this for at least 2 weeks after you finish your antibiotics. Patient not taking: Reported on 05/13/2016 05/01/16   Sherrie George, PA-C  URELLE (URELLE/URISED) 81 MG TABS tablet Take 1 tablet (81 mg total) by mouth 4 (four) times daily. 06/05/16   Sherrie George, PA-C    Family History Family History  Problem Relation Age of Onset  . Cancer Other   . Hypertension Other   . Stroke Other   . Heart attack Other   . Heart disease Mother   . Hypertension Mother   . Cancer Mother     ovarian    Social History Social History  Substance Use Topics  . Smoking status: Former Smoker    Packs/day: 0.50    Types: Cigarettes    Quit date: 04/11/2016  . Smokeless tobacco: Never Used  . Alcohol use No     Allergies   Latex   Review of Systems Review of Systems  Constitutional:       Per HPI, otherwise negative  HENT:       Per HPI,  otherwise negative  Respiratory:       Per HPI, otherwise negative  Cardiovascular:       Per HPI, otherwise negative  Gastrointestinal: Positive for nausea and vomiting.  Endocrine:       Negative aside from HPI  Genitourinary:       Neg aside from HPI   Musculoskeletal:       Per HPI, otherwise negative  Skin: Positive for rash.  Neurological: Positive for weakness. Negative for syncope.     Physical Exam Updated Vital Signs BP 109/78   Pulse (!) 130   Temp 98.8 F (37.1 C) (Oral)   Resp 25   Ht 5\' 2"  (1.575 m)   Wt 123 lb (55.8 kg)   SpO2 97%   BMI 22.50 kg/m   Physical Exam  Constitutional: She is oriented to person, place, and time.  Uncomfortable appearing female awake, alert, speaking clearly  HENT:  Head: Normocephalic and atraumatic.  Eyes: Conjunctivae and EOM are normal.  Cardiovascular: Regular rhythm.  Tachycardia present.   Pulmonary/Chest: Effort normal. No stridor. Tachypnea noted. No respiratory distress. She has decreased breath sounds.  Abdominal: She exhibits no distension.    Musculoskeletal: She exhibits no edema.       Arms: Neurological: She is alert and oriented to person, place, and time. She displays no atrophy and no tremor. No cranial nerve deficit or sensory deficit. She exhibits normal muscle tone.  Skin: Skin is warm and dry. Rash noted.  Diffuse papular rash with erythema throughout the habitus, sparing the palms, mucosa  Psychiatric: Her mood appears anxious.  Nursing note and vitals reviewed.     ED Treatments / Results  Labs (all labs ordered are listed, but only abnormal results are displayed) Labs Reviewed  COMPREHENSIVE METABOLIC PANEL - Abnormal; Notable for the following:       Result Value   Sodium 131 (*)    Chloride 93 (*)    Glucose, Bld 113 (*)    Calcium 8.4 (*)    Albumin 2.7 (*)    Anion gap 16 (*)    All other components within normal limits  CBC WITH DIFFERENTIAL/PLATELET - Abnormal; Notable for the  following:    WBC 19.1 (*)    Hemoglobin 11.9 (*)    HCT 35.3 (*)    RDW 16.7 (*)    Neutro Abs 10.5 (*)    Lymphs Abs 4.6 (*)    Monocytes Absolute 2.5 (*)    Eosinophils Absolute 1.1 (*)    Basophils Absolute 0.4 (*)    All other components within normal limits  C DIFFICILE QUICK SCREEN W PCR REFLEX  I-STAT BETA HCG BLOOD, ED ([facility], [facility], [facility] ONLY)    EKG  EKG Interpretation  Date/Time:  Thursday June 19 2016 11:59:48 EDT Ventricular Rate:  127 PR Interval:    QRS Duration: 75 QT Interval:  318 QTC Calculation: 463 R Axis:   77 Text Interpretation:  Sinus tachycardia Borderline T abnormalities, diffuse leads Baseline wander in lead(s) V3 Abnormal ekg Confirmed by Gerhard Munch  MD [phone]) on 06/19/2016 12:45:23 PM       Radiology Dg Chest 2 View  Result Date: 06/19/2016 CLINICAL DATA:  Shortness of breath EXAM: CHEST  2 VIEW COMPARISON:  06/09/2016 FINDINGS: Right-sided PICC line with the tip projecting over the SVC. There is no focal parenchymal opacity. There is no pleural effusion or pneumothorax. The heart and mediastinal contours are unremarkable. The osseous structures are unremarkable. IMPRESSION: No active cardiopulmonary disease. Electronically Signed   By: Elige Ko   On: 06/19/2016 13:25    Procedures Procedures (including critical care time)  Medications Ordered in ED Medications  sodium chloride 0.9 % bolus 1,000 mL (not administered)  methylPREDNISolone sodium succinate (SOLU-MEDROL) 125 mg/2 mL injection 125 mg (not administered)    Chart review notable for recent abscess surgery, subsequent ED visits. Initial Impression / Assessment and Plan / ED Course  I have reviewed the triage vital signs and the nursing notes.  Pertinent labs & imaging results that were available during my care of the patient were reviewed by me and considered in my medical decision making (see chart for details).  Clinical Course    After initial fluids, the patient  remains tachycardic, continues to have nausea, but no abdominal pain.  Update:, Following 2 L fluid resuscitation and the patient has persistent tachycardia, still no fever.  Rash remains persistent. Discussed patient's case with our infectious disease physician given the patient's recent IV use of cefotetan. Recommendation for CT abdomen pelvis, and he will follow-up tomorrow to determine if the patient should have alternative IV antibiotics, but absent fever, no indication for empiric antibiotics.  At the time of cessation, the patient had completed 9 of anticipated 14 day course of cefotetan Final Clinical Impressions(s) / ED Diagnoses  And female with notable history of multiple recent surgeries, including percutaneous drainage of tubo-ovarian abscess, complicated by cutaneous colonic fistula, now presents with ongoing rash, general discomfort, nausea Here the patient is awake, alert, with no abdominal pain, lower ongoing nausea, generalized discomfort. Patient is found to have tachycardia, but no fever.  Meets SIRS criteria.   Evaluation here concerning for substantial drug reaction versus anaphylaxis given the involvement of multiple organ systems, skin, cardiac, GI. With a soft, non-peritoneal abdomen, ongoing drainage from her ostomy tube, low suspicion for SBO.  After discussion with infectious disease, who will coordinate care tomorrow morning with the patient's care team, patient was admitted for further evaluation, management given SIRS, the persistent tachycardia, nausea, multiple medical issues.  On admission the CT scan was pending, as was infectious disease evaluation of CT results for consideration of re-initiation of antibiotics.   CRITICAL CARE Performed by: Gerhard MunchLOCKWOOD, Garrette Caine Total critical care time: 40 minutes Critical care time was exclusive of separately billable procedures and treating other patients. Critical care was necessary to treat or prevent imminent or  life-threatening deterioration. Critical care was time spent personally by me on the following activities: development of treatment plan with patient and/or surrogate as well as nursing, discussions with consultants, evaluation of patient's response to treatment, examination of patient, obtaining history from patient or surrogate, ordering and performing treatments and interventions, ordering and review of laboratory studies, ordering and review of radiographic studies, pulse oximetry and re-evaluation of patient's condition.   LATE NOTE ADDENDUM (patient was hospitalized seven days, had resolution of Sx and was d/c w/o complication).   Gerhard Munchobert Phebe Dettmer, MD 06/30/16 [phone]

## 2016-06-19 NOTE — ED Notes (Signed)
Dr. Jeraldine LootsLockwood made aware of pt's desire for pain medication for pt's incision site on her abd.

## 2016-06-19 NOTE — Progress Notes (Addendum)
ED CM contacted by [facility] IV therapy coordinator, Pam C, to confirm pt is active for IV antibiotics and will be followed for d/c needs

## 2016-06-19 NOTE — ED Notes (Signed)
Pt will be going to bed 31 in the stepdown unit, but can not go up until 1915 per the charge nurse

## 2016-06-19 NOTE — ED Notes (Signed)
PICC line in pt's right arm is patent as evidenced by flushing with NaCl. Blood returned noted.

## 2016-06-19 NOTE — H&P (Signed)
History and Physical    AHRIANNA SIGLIN Willis:[id number] DOB: 22-May-1986 DOA: 06/19/2016  PCP: Lendon Colonel., MD   Patient coming from: Home.  Chief Complaint: Skin rash.  HPI: Sheila Willis is a 30 y.o. female with medical history significant of depression, gestational diabetes, headaches, polycystic ovarian syndrome who has recently had extended hospitalizations in the past two months secondary to multiple surgeries and procedures due to tubo-ovarian abscess and nephrolithiasis with hydroureteronephrosis (See extensive DC summaries for further detail) and comes to the ER due to progressively worse pruritic rash.  Per patient she was discharged home with PICC line to received Cefotetan for 14 days at home. She completed 9 out of the 14 days of therapy, but stopped therapy after day 9 due to worsening of generalized erythematous, pruritic rash that she developed shortly after starting therapy. She states that the rash has gotten significantly worse today so she decided to come to the ED.   She states that her nausea has worsened as well and she has had frequent emesis. She denies fever, chills, headache, earache, sore throat, productive cough, dyspnea, wheezing, CP, diaphoresis, dizziness, palpitations, pitting edema of the lower extremities, diarrhea or GU symptoms.    ED Course: The patient received IVF boluses, IV solumedrol, analgesics and antiemetics reporting some relief of symptoms. Work up showed leukocytosis of 19K, CxR shows LLL atelectasis, CT scan abdomen shows persistence of mild Left hydroureteronephrosis.     Review of Systems: As per HPI otherwise 10 point review of systems negative.    Past Medical History:  Diagnosis Date  . Depression   . Gestational diabetes   . Headache(784.0)   . PCOS (polycystic ovarian syndrome)   . Postpartum care following vaginal delivery (2/1) 11/13/2012  . SVD (spontaneous vaginal delivery) 11/13/2012    Past Surgical History:    Procedure Laterality Date  . APPENDECTOMY  05/15/2016   Procedure: incidental APPENDECTOMY;  Surgeon: Avel Peace, MD;  Location: [facility];  Service: General;;  . COLECTOMY WITH COLOSTOMY CREATION/HARTMANN PROCEDURE N/A 05/15/2016   Procedure: partial  COLECTOMY AND  COLOSTOMY;  Surgeon: Avel Peace, MD;  Location: [facility];  Service: General;  Laterality: N/A;  . CYSTOSCOPY W/ URETERAL STENT PLACEMENT Left 05/15/2016   Procedure: CYSTOSCOPY;  Surgeon: Ihor Gully, MD;  Location: [facility];  Service: Urology;  Laterality: Left;  . CYSTOSCOPY W/ URETERAL STENT PLACEMENT Left 05/25/2016   Procedure: CYSTOSCOPY WITH RETROGRADE PYELOGRAM/LEFT URETERAL STENT PLACEMENT;  Surgeon: [name], MD;  Location: [facility];  Service: Urology;  Laterality: Left;  . INCISION AND DRAINAGE ABSCESS N/A 05/15/2016   Procedure: DRAINAGE ABSCESS OF INTRA ABDOMINAL ABSCESS;  Surgeon: Avel Peace, MD;  Location: [facility];  Service: General;  Laterality: N/A;  . LAPAROTOMY N/A 05/15/2016   Procedure: EXPLORATORY LAPAROTOMY;  Surgeon: Avel Peace, MD;  Location: [facility];  Service: General;  Laterality: N/A;  . NO PAST SURGERIES       reports that she quit smoking about 2 months ago. Her smoking use included Cigarettes. She smoked 0.50 packs per day. She has never used smokeless tobacco. She reports that she does not drink alcohol or use drugs.  Allergies  Allergen Reactions  . Latex Itching    Family History  Problem Relation Age of Onset  . Cancer Other   . Hypertension Other   . Stroke Other   . Heart attack Other   . Heart disease Mother   . Hypertension Mother   . Cancer Mother  ovarian     Prior to Admission medications   Medication Sig Start Date End Date Taking? Authorizing Provider  fentaNYL (DURAGESIC - DOSED MCG/HR) 50 MCG/HR Place 1 patch (50 mcg total) onto the skin every other day. Patient taking differently: Place 50 mcg onto the skin every 3 (three) days.  06/05/16  Yes Sherrie George, PA-C  mirabegron ER (MYRBETRIQ) 50 MG TB24 tablet Take 1 tablet (50 mg total) by mouth daily. 06/05/16  Yes Sherrie George, PA-C  morphine (MSIR) 15 MG tablet Take 1 tablet (15 mg total) by mouth every 4 (four) hours as needed for severe pain. 06/05/16  Yes Sherrie George, PA-C  ondansetron (ZOFRAN-ODT) 8 MG disintegrating tablet Take 1 tablet (8 mg total) by mouth 2 (two) times daily. 06/05/16  Yes Sherrie George, PA-C  polyethylene glycol [facility] / Ethelene Hal) packet You can follow package instructions and use if you think your constipated.  Right now you do not really need it.  The Pain medicine can cause you to be constipated.  You can buy at any drug store over the counter. 06/05/16  Yes Sherrie George, PA-C  URELLE (URELLE/URISED) 81 MG TABS tablet Take 1 tablet (81 mg total) by mouth 4 (four) times daily. 06/05/16  Yes Sherrie George, PA-C  acetaminophen (TYLENOL) 325 MG tablet Take two tablets every 6 hours as needed for pain.  This is your first line treatment for pain.  This is regular generic Tylenol, you can buy at any store. Patient not taking: Reported on 06/09/2016 05/01/16   Sherrie George, PA-C  clonazePAM (KLONOPIN) 0.5 MG tablet Take 1 tablet (0.5 mg total) by mouth 2 (two) times daily as needed (sleep anxiety). Patient not taking: Reported on 06/19/2016 06/05/16   Sherrie George, PA-C  doxycycline (VIBRA-TABS) 100 MG tablet Take 1 tablet (100 mg total) by mouth 2 (two) times daily with a meal. Patient not taking: Reported on 06/19/2016 06/05/16   Sherrie George, PA-C  saccharomyces boulardii (FLORASTOR) 250 MG capsule You can buy this at the drug store, and use to replace good bacteria while on antibiotics.  i would use this for at least 2 weeks after you finish your antibiotics. Patient not taking: Reported on 05/13/2016 05/01/16   Sherrie George, PA-C    Physical Exam: Vitals:   06/19/16 1505 06/19/16 1650 06/19/16 1653 06/19/16 1746  BP: 107/75  104/65   Pulse:  119 115 113 114  Resp: 21 24 22 21   Temp:      TempSrc:      SpO2: 100% 95% 95% 95%  Weight:      Height:          Constitutional: NAD, calm, comfortable Vitals:   06/19/16 1505 06/19/16 1650 06/19/16 1653 06/19/16 1746  BP: 107/75  104/65   Pulse: 119 115 113 114  Resp: 21 24 22 21   Temp:      TempSrc:      SpO2: 100% 95% 95% 95%  Weight:      Height:       Eyes: PERRL, lids and conjunctivae normal ENMT: Mucous membranes are mildly dry. No mucosal ulcers seen. Posterior pharynx clear of any exudate or lesions. Normal dentition.  Neck: normal, supple, no masses, no thyromegaly Respiratory: Decreased breath sounds at bases, otherwise CTA, no wheezing, no crackles. Normal respiratory effort. No accessory muscle use.  Cardiovascular: Tachycardic at 116 bpm, no murmurs / rubs / gallops. No extremity edema. 2+ pedal pulses. No carotid bruits.  Abdomen: Midline surgical wound and  dressing, soft, positive LLQ tenderness, no guarding or rebound, masses palpated. No hepatosplenomegaly. Bowel sounds positive.  Musculoskeletal: no clubbing / cyanosis. No joint deformity upper and lower extremities. Good ROM, no contractures. Normal muscle tone.  Skin: Extensive centrifugal rash that started on torso and now is extending to all 4 extremities. Neurologic: CN 2-12 grossly intact. Sensation intact, DTR normal. Strength 5/5 in all 4.  Psychiatric: Normal judgment and insight. Alert and oriented x 4.      Labs on Admission: I have personally reviewed following labs and imaging studies  CBC:  Recent Labs Lab 06/19/16 1205  WBC 19.1*  NEUTROABS 10.5*  HGB 11.9*  HCT 35.3*  MCV 84.9  PLT 330   Basic Metabolic Panel:  Recent Labs Lab 06/19/16 1205  NA 131*  K 3.5  CL 93*  CO2 22  GLUCOSE 113*  BUN 12  CREATININE 0.92  CALCIUM 8.4*   GFR: Estimated Creatinine Clearance: 70.1 mL/min (by C-G formula based on SCr of 0.92 mg/dL). Liver Function Tests:  Recent Labs Lab  06/19/16 1205  AST 21  ALT 14  ALKPHOS 83  BILITOT 0.7  PROT 7.2  ALBUMIN 2.7*   Urine analysis:    Component Value Date/Time   COLORURINE YELLOW 06/09/2016 2108   APPEARANCEUR CLOUDY (A) 06/09/2016 2108   LABSPEC 1.005 06/09/2016 2108   PHURINE 7.5 06/09/2016 2108   GLUCOSEU NEGATIVE 06/09/2016 2108   HGBUR LARGE (A) 06/09/2016 2108   BILIRUBINUR NEGATIVE 06/09/2016 2108   KETONESUR NEGATIVE 06/09/2016 2108   PROTEINUR NEGATIVE 06/09/2016 2108   UROBILINOGEN 0.2 05/01/2015 1622   NITRITE NEGATIVE 06/09/2016 2108   LEUKOCYTESUR SMALL (A) 06/09/2016 2108     Recent Results (from the past 240 hour(s))  Culture, blood (Routine X 2)     Status: None   Collection Time: 06/09/16  8:12 PM  Result Value Ref Range Status   Specimen Description BLOOD LEFT ANTECUBITAL  Final   Special Requests BOTTLES DRAWN AEROBIC AND ANAEROBIC 5CC EA  Final   Culture   Final    NO GROWTH 5 DAYS Performed at [facility]    Report Status 06/14/2016 FINAL  Final  Urine culture     Status: None   Collection Time: 06/09/16  9:08 PM  Result Value Ref Range Status   Specimen Description URINE, RANDOM  Final   Special Requests NONE  Final   Culture NO GROWTH Performed at [facility]   Final   Report Status 06/11/2016 FINAL  Final  Culture, blood (Routine X 2)     Status: None   Collection Time: 06/09/16  9:22 PM  Result Value Ref Range Status   Specimen Description BLOOD R PICC LINE  Final   Special Requests BOTTLES DRAWN AEROBIC AND ANAEROBIC 5CC EA  Final   Culture   Final    NO GROWTH 5 DAYS Performed at [facility]    Report Status 06/14/2016 FINAL  Final  C difficile quick scan w PCR reflex     Status: None   Collection Time: 06/19/16  2:00 PM  Result Value Ref Range Status   C Diff antigen NEGATIVE NEGATIVE Final   C Diff toxin NEGATIVE NEGATIVE Final   C Diff interpretation No C. difficile detected.  Final     Radiological Exams on Admission: Dg Chest 2  View  Result Date: 06/19/2016 CLINICAL DATA:  Shortness of breath EXAM: CHEST  2 VIEW COMPARISON:  06/09/2016 FINDINGS: Right-sided PICC line with the tip  projecting over the SVC. There is no focal parenchymal opacity. There is no pleural effusion or pneumothorax. The heart and mediastinal contours are unremarkable. The osseous structures are unremarkable. IMPRESSION: No active cardiopulmonary disease. 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 Sagittal and coronal MPR images reconstructed from axial data set. CONTRAST:  ISOVUE-300 IOPAMIDOL (ISOVUE-300) INJECTION 61% IV. No oral contrast administered. COMPARISON:  06/09/2016 FINDINGS: Lower chest: Tip of RIGHT arm PICC line at cavoatrial junction. Dependent atelectasis LEFT lower lobe. Hepatobiliary: Liver and gallbladder normal appearance. Pancreas: Normal appearance Spleen: Normal appearance Adrenals/Urinary Tract: LEFT hydronephrosis and hydroureter despite LEFT ureteral stent. No evidence of renal mass or calcification. RIGHT kidney, RIGHT ureter, and bladder otherwise normal appearance. Mild stranding of the prevesical fat noted, slightly improved. Adrenal glands normal appearance. Stomach/Bowel: Appendix surgically absent. Sigmoid colostomy LEFT mid abdomen.  Stomach and remaining bowel loops unremarkable. Vascular/Lymphatic: Numerous normal sized retroperitoneal and mesenteric nodes. No definite adenopathy. Aorta normal caliber. Reproductive: Uterus and unremarkable. LEFT adnexal tissue planes remain ill defined similar to prior study. Difficult to exclude subtle focal fluid collections from un opacified bowel loops in pelvis. Musculoskeletal: Unremarkable Other: No free air or free fluid. IMPRESSION: Study decreased stranding of prevesical space fat since prior study. Mild persistent LEFT hydronephrosis and hydroureter despite stenting. Numerous though normal sized mesenteric and retroperitoneal nodes, appears slightly more prominent than on previous exam. Dependent atelectasis LEFT lower lobe. Electronically Signed   By: Ulyses Southward M.D.   On: 06/19/2016 18:39    EKG: Independently reviewed. Vent. rate 127 BPM PR interval * ms QRS duration 75 ms QT/QTc 318/463 ms P-R-T axes 78 77 -15 Sinus tachycardia Borderline T abnormalities, diffuse leads Baseline wander in lead(s) V3 Abnormal ekg  Assessment/Plan Principal Problem:   Allergic reaction Admit to stepdown for close monitoring. Continue IV Solu-Medrol. Benadryl 25 mg IVP every 4 hours when necessary itching. Famotidine 20 mg IVP every 12 hours. Per ID, no further antibiotic therapy until reevaluated by them.  Active Problems:   TOA (tubo-ovarian abscess) No increased symptomatology at this time per patient. She finished 9 out of 14 days of Cefotetan at time before stopping. IV Will evaluate need for resuming antibiotics with a different agent.    Leukocytosis Follow-up WBC in the morning. Continuation or discontinuation of IV antibiotics per ID.    Hyponatremia Likely secondary to emesis last night. Continue IV fluids, monitor BUN/creatinine and electrolytes.    Sinus tachycardia (HCC) Patient's initial heart rate in the emergency department was 149 bpm. She continues to be  tachycardic in the 120s despite IV fluids. EKG shows questionable T-wave abnormalities. Continue cardiac monitoring. Check echocardiogram in the morning.    Hydronephrosis of left kidney Patient still has mild hydronephrosis despite and stenting. She is following with urology and was told that stent placement may need to stay for 3 months.    DVT  prophylaxis: Lovenox.  Code Status: Full code. Family Communication:  Disposition Plan: Admit to the stepdown unit for close monitoring and treatment. Consults called: Infectious diseases (Dr. Orvan Falconerampbell). Admission status: Stepdown/observation.   Bobette Moavid Manuel Ortiz MD [facility] Pager [phone].  If 7PM-7AM, please contact night-coverage [email or web address] Password [email or web address]  06/19/2016, 7:40 PM

## 2016-06-19 NOTE — ED Notes (Signed)
PT in CT.

## 2016-06-20 DIAGNOSIS — Z8632 Personal history of gestational diabetes: Secondary | ICD-10-CM | POA: Diagnosis not present

## 2016-06-20 DIAGNOSIS — T7840XA Allergy, unspecified, initial encounter: Secondary | ICD-10-CM | POA: Diagnosis present

## 2016-06-20 DIAGNOSIS — D72829 Elevated white blood cell count, unspecified: Secondary | ICD-10-CM | POA: Diagnosis present

## 2016-06-20 DIAGNOSIS — Z87891 Personal history of nicotine dependence: Secondary | ICD-10-CM | POA: Diagnosis not present

## 2016-06-20 DIAGNOSIS — Z809 Family history of malignant neoplasm, unspecified: Secondary | ICD-10-CM | POA: Diagnosis not present

## 2016-06-20 DIAGNOSIS — L299 Pruritus, unspecified: Secondary | ICD-10-CM | POA: Diagnosis present

## 2016-06-20 DIAGNOSIS — Z8249 Family history of ischemic heart disease and other diseases of the circulatory system: Secondary | ICD-10-CM | POA: Diagnosis not present

## 2016-06-20 DIAGNOSIS — T361X5A Adverse effect of cephalosporins and other beta-lactam antibiotics, initial encounter: Secondary | ICD-10-CM

## 2016-06-20 DIAGNOSIS — Z933 Colostomy status: Secondary | ICD-10-CM | POA: Diagnosis not present

## 2016-06-20 DIAGNOSIS — Z9049 Acquired absence of other specified parts of digestive tract: Secondary | ICD-10-CM | POA: Diagnosis not present

## 2016-06-20 DIAGNOSIS — T7840XD Allergy, unspecified, subsequent encounter: Secondary | ICD-10-CM | POA: Diagnosis not present

## 2016-06-20 DIAGNOSIS — Z95828 Presence of other vascular implants and grafts: Secondary | ICD-10-CM

## 2016-06-20 DIAGNOSIS — N132 Hydronephrosis with renal and ureteral calculous obstruction: Secondary | ICD-10-CM | POA: Diagnosis present

## 2016-06-20 DIAGNOSIS — E871 Hypo-osmolality and hyponatremia: Secondary | ICD-10-CM | POA: Diagnosis present

## 2016-06-20 DIAGNOSIS — L270 Generalized skin eruption due to drugs and medicaments taken internally: Secondary | ICD-10-CM | POA: Diagnosis present

## 2016-06-20 DIAGNOSIS — L538 Other specified erythematous conditions: Secondary | ICD-10-CM | POA: Diagnosis present

## 2016-06-20 DIAGNOSIS — R000 Tachycardia, unspecified: Secondary | ICD-10-CM | POA: Diagnosis present

## 2016-06-20 DIAGNOSIS — R21 Rash and other nonspecific skin eruption: Secondary | ICD-10-CM | POA: Diagnosis present

## 2016-06-20 DIAGNOSIS — R6510 Systemic inflammatory response syndrome (SIRS) of non-infectious origin without acute organ dysfunction: Secondary | ICD-10-CM | POA: Diagnosis present

## 2016-06-20 DIAGNOSIS — N1330 Unspecified hydronephrosis: Secondary | ICD-10-CM | POA: Diagnosis not present

## 2016-06-20 LAB — CBC WITH DIFFERENTIAL/PLATELET
BASOS ABS: 0.3 10*3/uL — AB (ref 0.0–0.1)
BASOS PCT: 2 %
EOS ABS: 0.3 10*3/uL (ref 0.0–0.7)
Eosinophils Relative: 2 %
HCT: 28.9 % — ABNORMAL LOW (ref 36.0–46.0)
Hemoglobin: 9.9 g/dL — ABNORMAL LOW (ref 12.0–15.0)
LYMPHS PCT: 23 %
Lymphs Abs: 3.8 10*3/uL (ref 0.7–4.0)
MCH: 29.3 pg (ref 26.0–34.0)
MCHC: 34.3 g/dL (ref 30.0–36.0)
MCV: 85.5 fL (ref 78.0–100.0)
MONO ABS: 1.5 10*3/uL — AB (ref 0.1–1.0)
Monocytes Relative: 9 %
NEUTROS ABS: 10.5 10*3/uL — AB (ref 1.7–7.7)
NEUTROS PCT: 64 %
PLATELETS: 319 10*3/uL (ref 150–400)
RBC: 3.38 MIL/uL — ABNORMAL LOW (ref 3.87–5.11)
RDW: 17.1 % — ABNORMAL HIGH (ref 11.5–15.5)
WBC: 16.4 10*3/uL — ABNORMAL HIGH (ref 4.0–10.5)

## 2016-06-20 LAB — COMPREHENSIVE METABOLIC PANEL
ALBUMIN: 2.2 g/dL — AB (ref 3.5–5.0)
ALT: 12 U/L — ABNORMAL LOW (ref 14–54)
ANION GAP: 7 (ref 5–15)
AST: 16 U/L (ref 15–41)
Alkaline Phosphatase: 66 U/L (ref 38–126)
BUN: 9 mg/dL (ref 6–20)
CHLORIDE: 107 mmol/L (ref 101–111)
CO2: 22 mmol/L (ref 22–32)
Calcium: 7.7 mg/dL — ABNORMAL LOW (ref 8.9–10.3)
Creatinine, Ser: 0.58 mg/dL (ref 0.44–1.00)
GFR calc non Af Amer: >60 mL/min (ref >60)
GLUCOSE: 135 mg/dL — AB (ref 65–99)
Potassium: 3.5 mmol/L (ref 3.5–5.1)
SODIUM: 136 mmol/L (ref 135–145)
Total Bilirubin: 0.2 mg/dL — ABNORMAL LOW (ref 0.3–1.2)
Total Protein: 5.8 g/dL — ABNORMAL LOW (ref 6.5–8.1)

## 2016-06-20 LAB — MRSA PCR SCREENING: MRSA by PCR: NEGATIVE

## 2016-06-20 MED ORDER — IBUPROFEN 200 MG PO TABS
400.0000 mg | ORAL_TABLET | Freq: Four times a day (QID) | ORAL | Status: DC | PRN
  Administered 2016-06-20 – 2016-06-21 (×3): 400 mg via ORAL
  Filled 2016-06-20 (×5): qty 2

## 2016-06-20 MED ORDER — ACETAMINOPHEN 325 MG PO TABS
650.0000 mg | ORAL_TABLET | Freq: Four times a day (QID) | ORAL | Status: DC | PRN
  Filled 2016-06-20: qty 2

## 2016-06-20 MED ORDER — HYDROXYZINE HCL 50 MG/ML IM SOLN
50.0000 mg | Freq: Four times a day (QID) | INTRAMUSCULAR | Status: DC | PRN
  Filled 2016-06-20 (×2): qty 1

## 2016-06-20 MED ORDER — HYDROXYZINE HCL 25 MG PO TABS
25.0000 mg | ORAL_TABLET | Freq: Once | ORAL | Status: AC
  Administered 2016-06-20: 25 mg via ORAL
  Filled 2016-06-20: qty 1

## 2016-06-20 MED ORDER — HYDROXYZINE HCL 25 MG PO TABS
50.0000 mg | ORAL_TABLET | Freq: Four times a day (QID) | ORAL | Status: DC | PRN
  Administered 2016-06-20 – 2016-06-21 (×3): 50 mg via ORAL
  Filled 2016-06-20 (×3): qty 2

## 2016-06-20 NOTE — Progress Notes (Signed)
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 She was discharged home on Cefotetan and Doxycyline. She has returned to the hospital for a rash that began about 5-6 ays ago which has worsened despite stopping antibiotics on 9/4.   Subjective: She is still itching which is the most severe on her arms. No other complaints.   Assessment & Plan:   Principal Problem:   Allergic drug rash/ SIRS - low grade fever, tachycardia, hypotension, leukocytosis - cont IV Solumedrol, Pepcid and change Benadryl to Vistaril  - per ID, we should continue to hold antibiotics  Active Problems:   TOA (tubo-ovarian abscess) - underwent placement of drains on 7/10 and 7/13 which have been removed- Abscess grew E coli - as mentioned above, holding antibiotics  Colostomy  - s/p colocutaneous fistula - cont wet to dry dressing on midline wound - gen surgery have not closed the wound completely and allowing to heal by secondary intention    Leukocytosis - possibly due to drug reaction    Hyponatremia - improved with NS infusion- can continue this for now    Hydronephrosis of left kidney - has left ureteral stent  DVT prophylaxis: Lovenox Code Status: Full  Family Communication: none Disposition Plan: follow in SDU Consultants:   ID Procedures:    Antimicrobials:  Anti-infectives    None       Objective: Vitals:   06/20/16 0952 06/20/16 1000 06/20/16 1100 06/20/16 1200  BP: 93/60     Pulse:    (!) 110  Resp:  (!) 24 20 (!) 21  Temp:      TempSrc:      SpO2:    100%  Weight:      Height:        Intake/Output Summary (Last 24 hours) at 06/20/16 1203 Last data filed at 06/20/16 1100  Gross per 24 hour  Intake          3197.09 ml  Output              1650 ml  Net          1547.09 ml   Filed Weights   06/19/16 1146 06/19/16 1157 06/19/16 2000  Weight: 55.8 kg (123 lb) 55.8 kg (123 lb) 56.4 kg (124 lb 5.4 oz)    Examination: General exam: Appears comfortable  HEENT: PERRLA, oral mucosa moist, no sclera icterus or thrush Respiratory system: Clear to auscultation. Respiratory effort normal. Cardiovascular system: S1 & S2 heard, RRR.  No murmurs  Gastrointestinal system: Abdomen soft, non-tender, nondistended. Normal bowel sound. No organomegaly Central nervous system: Alert and oriented. No focal neurological deficits. Extremities: No cyanosis, clubbing or edema Skin: severe maculopapular rash over entire body with edema in areas where rash is most prominent Psychiatry:  Mood & affect appropriate.   Data Reviewed: I have personally reviewed following labs and imaging studies  CBC:  Recent Labs Lab 06/19/16 1205 06/20/16 0349  WBC 19.1* 16.4*  NEUTROABS 10.5* 10.5*  HGB 11.9* 9.9*  HCT 35.3* 28.9*  MCV 84.9 85.5  PLT 330 319   Basic Metabolic Panel:  Recent Labs Lab 06/19/16 1205 06/19/16 2015 06/20/16 0349  NA 131* 134* 136  K 3.5 3.9 3.5  CL 93* 102 107  CO2 22 23  22  GLUCOSE 113* 114* 135*  BUN 12 9 9   CREATININE 0.92 0.71 0.58  CALCIUM 8.4* 7.9* 7.7*  MG  --  2.1  --   PHOS  --  2.4*  --    GFR: Estimated Creatinine Clearance: 80.6 mL/min (by C-G formula based on SCr of 0.8 mg/dL). Liver Function Tests:  Recent Labs Lab 06/19/16 1205 06/20/16 0349  AST 21 16  ALT 14 12*  ALKPHOS 83 66  BILITOT 0.7 0.2*  PROT 7.2 5.8*  ALBUMIN 2.7* 2.2*   No results for input(s): LIPASE, AMYLASE in the last 168 hours. No results for input(s): AMMONIA in the last 168 hours. Coagulation Profile: No results for input(s): INR, PROTIME in the last 168 hours. Cardiac Enzymes: No results for input(s): CKTOTAL, CKMB, CKMBINDEX, TROPONINI in the last 168 hours. BNP (last 3 results) No results for input(s): PROBNP  in the last 8760 hours. HbA1C: No results for input(s): HGBA1C in the last 72 hours. CBG: No results for input(s): GLUCAP in the last 168 hours. Lipid Profile: No results for input(s): CHOL, HDL, LDLCALC, TRIG, CHOLHDL, LDLDIRECT in the last 72 hours. Thyroid Function Tests: No results for input(s): TSH, T4TOTAL, FREET4, T3FREE, THYROIDAB in the last 72 hours. Anemia Panel: No results for input(s): VITAMINB12, FOLATE, FERRITIN, TIBC, IRON, RETICCTPCT in the last 72 hours. Urine analysis:    Component Value Date/Time   COLORURINE YELLOW 06/09/2016 2108   APPEARANCEUR CLOUDY (A) 06/09/2016 2108   LABSPEC 1.005 06/09/2016 2108   PHURINE 7.5 06/09/2016 2108   GLUCOSEU NEGATIVE 06/09/2016 2108   HGBUR LARGE (A) 06/09/2016 2108   BILIRUBINUR NEGATIVE 06/09/2016 2108   KETONESUR NEGATIVE 06/09/2016 2108   PROTEINUR NEGATIVE 06/09/2016 2108   UROBILINOGEN 0.2 05/01/2015 1622   NITRITE NEGATIVE 06/09/2016 2108   LEUKOCYTESUR SMALL (A) 06/09/2016 2108   Sepsis Labs: @LABRCNTIP (procalcitonin:4,lacticidven:4) ) Recent Results (from the past 240 hour(s))  C difficile quick scan w PCR reflex     Status: None   Collection Time: 06/19/16  2:00 PM  Result Value Ref Range Status   C Diff antigen NEGATIVE NEGATIVE Final   C Diff toxin NEGATIVE NEGATIVE Final   C Diff interpretation No C. difficile detected.  Final  MRSA PCR Screening     Status: None   Collection Time: 06/19/16 10:39 PM  Result Value Ref Range Status   MRSA by PCR NEGATIVE NEGATIVE Final    Comment:        The GeneXpert MRSA Assay (FDA approved for NASAL specimens only), is one component of a comprehensive MRSA colonization surveillance program. It is not intended to diagnose MRSA infection nor to guide or monitor treatment for MRSA infections.          Radiology Studies: Dg Chest 2 View  Result Date: 06/19/2016 CLINICAL DATA:  Shortness of breath EXAM: CHEST  2 VIEW COMPARISON:  06/09/2016 FINDINGS:  Right-sided PICC line with the tip projecting over the SVC. There is no focal parenchymal opacity. There is no pleural effusion or pneumothorax. The heart and mediastinal contours are unremarkable. The osseous structures are unremarkable. IMPRESSION: No active cardiopulmonary disease. 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 Sagittal and coronal MPR images reconstructed from axial data set. CONTRAST:  ISOVUE-300 IOPAMIDOL (ISOVUE-300) INJECTION 61% IV. No oral contrast administered. COMPARISON:  06/09/2016 FINDINGS: Lower chest: Tip of RIGHT arm PICC line at cavoatrial junction. Dependent atelectasis LEFT lower lobe. Hepatobiliary: Liver and gallbladder normal appearance. Pancreas: Normal appearance Spleen: Normal appearance Adrenals/Urinary Tract: LEFT hydronephrosis and hydroureter despite LEFT ureteral stent. No evidence of renal mass or calcification. RIGHT kidney, RIGHT ureter, and bladder otherwise normal appearance. Mild stranding of the prevesical fat noted, slightly improved. Adrenal glands normal appearance. Stomach/Bowel: Appendix surgically absent. Sigmoid colostomy LEFT mid abdomen. Stomach and remaining bowel loops unremarkable. Vascular/Lymphatic: Numerous normal sized retroperitoneal and mesenteric  nodes. No definite adenopathy. Aorta normal caliber. Reproductive: Uterus and unremarkable. LEFT adnexal tissue planes remain ill defined similar to prior study. Difficult to exclude subtle focal fluid collections from un opacified bowel loops in pelvis. Musculoskeletal: Unremarkable Other: No free air or free fluid. IMPRESSION: Study decreased stranding of prevesical space fat since prior study. Mild persistent LEFT hydronephrosis and hydroureter despite stenting. Numerous though normal sized mesenteric and retroperitoneal nodes, appears slightly more prominent than on previous exam. Dependent atelectasis LEFT lower lobe. Electronically Signed   By: Ulyses Southward M.D.   On: 06/19/2016 18:39      Scheduled Meds: . enoxaparin (LOVENOX) injection  40 mg Subcutaneous Q24H  . famotidine (PEPCID) IV  20 mg Intravenous Q12H  . fentaNYL  50 mcg Transdermal Q72H  . methylPREDNISolone sodium succinate  80 mg Intravenous Q6H  . mirabegron ER  50 mg Oral Daily  . sodium chloride flush  3 mL Intravenous Q12H  . URELLE  1 tablet Oral QID   Continuous Infusions: . 0.9 % NaCl with KCl 20 mEq / L 125 mL/hr at 06/20/16 0530     LOS: 0 days    Time spent in minutes: 35    Alesha Jaffee, MD [facility] Pager: [email or web address] Password [email or web address] 06/20/2016, 12:03 PM

## 2016-06-20 NOTE — Consult Note (Signed)
[facility]    Date of Admission:  06/19/2016          Reason for Consult: allergic drug rash    Referring Physician: Dr. Bobette Mo  Principal Problem:   Allergic drug rash Active Problems:   Pelvic abscess in female   TOA (tubo-ovarian abscess)   Leukocytosis   Hyponatremia   Sinus tachycardia (HCC)   Hydronephrosis of left kidney   . enoxaparin (LOVENOX) injection  40 mg Subcutaneous Q24H  . famotidine (PEPCID) IV  20 mg Intravenous Q12H  . fentaNYL  50 mcg Transdermal Q72H  . methylPREDNISolone sodium succinate  80 mg Intravenous Q6H  . mirabegron ER  50 mg Oral Daily  . sodium chloride flush  3 mL Intravenous Q12H  . URELLE  1 tablet Oral QID    Recommendations: 1. Continue observation off of antibiotics   Assessment: [name] received 58 days of continuous antibiotic therapy for her complicated pelvic abscess. She recently developed what is probably an allergic drug rash to cefotetan. Repeat CT scan does not show any obvious residual abscess. I favor observation off of antibiotics for now.    HPI: Sheila Willis is a 30 y.o. female who developed a tubo-ovarian abscess in late June. She was admitted to the hospital on 04/18/2016. She had 2 drains placed on 04/21/2016 and 04/24/2016. Abscess cultures grew Escherichia coli. She has had a complicated course since that time. 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 She's been seen by my partners, Dr. Ninetta Lights and Dr. Ilsa Iha. She was on continuous IV antibiotic therapy. She was readmitted to the hospital in early August and underwent exploratory laparotomy, partial colectomy, colostomy, incidental appendectomy and incision and drainage of the abscess. No cultures were done at that time. She also required left ureteral stent placement. Following his second hospitalization she was discharged home on IV cefotetan and an oral doxycycline. About one week ago she  developed a diffuse, red pruritic rash. She noted that she felt worse during her cefotetan and infusions and stopped both antibiotics after her doses on 06/16/2016. Because of worsening rash and itching she was readmitted yesterday.   Review of Systems: Review of Systems  Constitutional: Negative for chills, diaphoresis, fever and malaise/fatigue.  HENT: Negative for sore throat.   Respiratory: Negative for cough, sputum production and shortness of breath.   Cardiovascular: Negative for chest pain.  Gastrointestinal: Positive for abdominal pain. Negative for diarrhea, nausea and vomiting.  Genitourinary: Negative for dysuria and frequency.  Musculoskeletal: Negative for joint pain and myalgias.  Skin: Positive for itching and rash.  Neurological: Negative for headaches.    Past Medical History:  Diagnosis Date  . Depression   . Gestational diabetes   . Headache(784.0)   . PCOS (polycystic ovarian syndrome)   . Postpartum care following vaginal delivery (2/1) 11/13/2012  . SVD (spontaneous vaginal delivery) 11/13/2012    Social History  Substance Use Topics  . Smoking status: Former Smoker    Packs/day: 0.50    Types: Cigarettes    Quit date: 04/11/2016  . Smokeless tobacco: Never Used  . Alcohol use No    Family History  Problem Relation Age of Onset  . Cancer Other   . Hypertension Other   . Stroke Other   . Heart attack Other   . Heart disease Mother   . Hypertension Mother   . Cancer Mother  ovarian   Allergies  Allergen Reactions  . Latex Itching  . Cefotetan Rash    OBJECTIVE: Blood pressure 93/60, pulse 95, temperature 99 F (37.2 C), temperature source Oral, resp. rate 17, height 5\' 2"  (1.575 m), weight 124 lb 5.4 oz (56.4 kg), SpO2 100 %.  Physical Exam  Constitutional: She is oriented to person, place, and time.  She is resting quietly in bed.  HENT:  Mouth/Throat: No oropharyngeal exudate.  Eyes: Conjunctivae are normal.  Cardiovascular: Normal  rate and regular rhythm.   No murmur heard. Pulmonary/Chest: Breath sounds normal. She has no wheezes. She has no rales.  Abdominal: Soft. She exhibits no mass. There is tenderness.  Mild left-sided tenderness just above her colostomy. She has a clean dry gauze dressing over her midline incision.  Musculoskeletal: Normal range of motion. She exhibits no edema or tenderness.  Neurological: She is alert and oriented to person, place, and time.  Skin: Rash noted. There is erythema.  She has a diffuse red rash over her face and trunk and to a lesser degree her lower extremities. There is some diffuse puffiness of her face. Her right arm PICC site appears normal.  Psychiatric: Mood and affect normal.    Lab Results Lab Results  Component Value Date   WBC 16.4 (H) 06/20/2016   HGB 9.9 (L) 06/20/2016   HCT 28.9 (L) 06/20/2016   MCV 85.5 06/20/2016   PLT 319 06/20/2016    Lab Results  Component Value Date   CREATININE 0.58 06/20/2016   BUN 9 06/20/2016   NA 136 06/20/2016   K 3.5 06/20/2016   CL 107 06/20/2016   CO2 22 06/20/2016    Lab Results  Component Value Date   ALT 12 (L) 06/20/2016   AST 16 06/20/2016   ALKPHOS 66 06/20/2016   BILITOT 0.2 (L) 06/20/2016     Microbiology: Recent Results (from the past 240 hour(s))  C difficile quick scan w PCR reflex     Status: None   Collection Time: 06/19/16  2:00 PM  Result Value Ref Range Status   C Diff antigen NEGATIVE NEGATIVE Final   C Diff toxin NEGATIVE NEGATIVE Final   C Diff interpretation No C. difficile detected.  Final  MRSA PCR Screening     Status: None   Collection Time: 06/19/16 10:39 PM  Result Value Ref Range Status   MRSA by PCR NEGATIVE NEGATIVE Final    Comment:        The GeneXpert MRSA Assay (FDA approved for NASAL specimens only), is one component of a comprehensive MRSA colonization surveillance program. It is not intended to diagnose MRSA infection nor to guide or monitor treatment for MRSA  infections.    CT of abdomen and pelvis with contrast 06/19/2016  IMPRESSION: Study decreased stranding of prevesical space fat since prior study.  Mild persistent LEFT hydronephrosis and hydroureter despite stenting.  Numerous though normal sized mesenteric and retroperitoneal nodes, appears slightly more prominent than on previous exam.  Dependent atelectasis LEFT lower lobe.   Electronically Signed   By: Ulyses SouthwardMark  Boles M.D.   On: 06/19/2016 18:39  Cliffton AstersJohn Cregg Jutte, MD [facility] [phone] pager   [phone] cell 06/20/2016, 11:04 AM

## 2016-06-21 LAB — BASIC METABOLIC PANEL
ANION GAP: 5 (ref 5–15)
BUN: 8 mg/dL (ref 6–20)
CALCIUM: 7.9 mg/dL — AB (ref 8.9–10.3)
CO2: 23 mmol/L (ref 22–32)
CREATININE: 0.61 mg/dL (ref 0.44–1.00)
Chloride: 111 mmol/L (ref 101–111)
GFR calc Af Amer: >60 mL/min (ref >60)
GLUCOSE: 156 mg/dL — AB (ref 65–99)
Potassium: 3.6 mmol/L (ref 3.5–5.1)
Sodium: 139 mmol/L (ref 135–145)

## 2016-06-21 LAB — CBC
HCT: 31.3 % — ABNORMAL LOW (ref 36.0–46.0)
Hemoglobin: 10.4 g/dL — ABNORMAL LOW (ref 12.0–15.0)
MCH: 28.4 pg (ref 26.0–34.0)
MCHC: 33.2 g/dL (ref 30.0–36.0)
MCV: 85.5 fL (ref 78.0–100.0)
PLATELETS: 386 10*3/uL (ref 150–400)
RBC: 3.66 MIL/uL — ABNORMAL LOW (ref 3.87–5.11)
RDW: 17.6 % — AB (ref 11.5–15.5)
WBC: 28.1 10*3/uL — AB (ref 4.0–10.5)

## 2016-06-21 MED ORDER — OXYBUTYNIN CHLORIDE 5 MG PO TABS
5.0000 mg | ORAL_TABLET | Freq: Two times a day (BID) | ORAL | Status: DC
  Administered 2016-06-21 – 2016-06-22 (×3): 5 mg via ORAL
  Filled 2016-06-21 (×3): qty 1

## 2016-06-21 MED ORDER — FENTANYL CITRATE (PF) 100 MCG/2ML IJ SOLN
12.5000 ug | INTRAMUSCULAR | Status: DC | PRN
  Administered 2016-06-21: 12.5 ug via INTRAVENOUS
  Filled 2016-06-21: qty 2

## 2016-06-21 MED ORDER — MORPHINE SULFATE 15 MG PO TABS
15.0000 mg | ORAL_TABLET | ORAL | Status: DC | PRN
  Administered 2016-06-21 – 2016-06-22 (×6): 15 mg via ORAL
  Filled 2016-06-21 (×6): qty 1

## 2016-06-21 MED ORDER — HYDROXYZINE HCL 25 MG PO TABS
50.0000 mg | ORAL_TABLET | ORAL | Status: DC | PRN
  Administered 2016-06-21 – 2016-06-26 (×21): 50 mg via ORAL
  Filled 2016-06-21 (×21): qty 2

## 2016-06-21 MED ORDER — CAMPHOR-MENTHOL 0.5-0.5 % EX LOTN
TOPICAL_LOTION | CUTANEOUS | Status: DC | PRN
  Administered 2016-06-21: 16:00:00 via TOPICAL
  Filled 2016-06-21: qty 222

## 2016-06-21 MED ORDER — LIP MEDEX EX OINT
TOPICAL_OINTMENT | CUTANEOUS | Status: DC | PRN
  Administered 2016-06-21: 09:00:00 via TOPICAL
  Filled 2016-06-21: qty 7

## 2016-06-21 MED ORDER — ALTEPLASE 2 MG IJ SOLR
2.0000 mg | Freq: Once | INTRAMUSCULAR | Status: AC
Start: 2016-06-21 — End: 2016-06-21
  Administered 2016-06-21: 2 mg
  Filled 2016-06-21: qty 2

## 2016-06-21 NOTE — Progress Notes (Signed)
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 She was discharged home on Cefotetan and Doxycyline. She has returned to the hospital for a rash that began about 5-6 ays ago which has worsened despite stopping antibiotics on 9/4.   Subjective: Feels that her rash is getting worse. Itching is very bad.   Assessment & Plan:   Principal Problem:   Allergic drug rash/ SIRS - low grade fever, tachycardia, hypotension, leukocytosis - cont IV Solumedrol, Pepcid and Vistaril - increase frequency to Q 4 - per ID, we should continue to hold antibiotics  Active Problems:   TOA (tubo-ovarian abscess) - underwent placement of drains on 7/10 and 7/13 which have been removed- Abscess grew E coli - as mentioned above, holding antibiotics  Colostomy  - s/p colocutaneous fistula - cont wet to dry dressing on midline wound - gen surgery has not closed the wound completely and are allowing to heal by secondary intention    Leukocytosis - possibly due to drug reaction    Hyponatremia - improved with NS infusion- can stop this today    Hydronephrosis of left kidney - has left ureteral stent  DVT prophylaxis: Lovenox Code Status: Full  Family Communication: none Disposition Plan: follow in SDU Consultants:   ID Procedures:    Antimicrobials:  Anti-infectives    None       Objective: Vitals:   06/20/16 2305 06/21/16 0550 06/21/16 0553 06/21/16 0800  BP:  (!) 82/59    Pulse:   79   Resp:   (!) 8   Temp: 97.8 F (36.6 C)  97.8 F (36.6 C) 97.6 F (36.4 C)  TempSrc: Oral  Oral Oral  SpO2:   100%   Weight:      Height:        Intake/Output Summary (Last 24 hours) at 06/21/16 1148 Last data filed at 06/21/16 0200  Gross per 24 hour  Intake              1925 ml  Output              800 ml  Net             1125 ml   Filed Weights   06/19/16 1146 06/19/16 1157 06/19/16 2000  Weight: 55.8 kg (123 lb) 55.8 kg (123 lb) 56.4 kg (124 lb 5.4 oz)    Examination: General exam: Appears comfortable  HEENT: PERRLA, oral mucosa moist, no sclera icterus or thrush Respiratory system: Clear to auscultation. Respiratory effort normal. Cardiovascular system: S1 & S2 heard, RRR.  No murmurs  Gastrointestinal system: Abdomen soft, non-tender, nondistended. Normal bowel sound. No organomegaly Central nervous system: Alert and oriented. No focal neurological deficits. Extremities: No cyanosis, clubbing or edema Skin: severe maculopapular rash over entire body with edema in areas where rash is most prominent Psychiatry:  Mood & affect appropriate.   Data Reviewed: I have personally reviewed following labs and imaging studies  CBC:  Recent Labs Lab 06/19/16 1205 06/20/16 0349 06/21/16 1010  WBC 19.1* 16.4* 28.1*  NEUTROABS 10.5* 10.5*  --   HGB 11.9* 9.9* 10.4*  HCT 35.3* 28.9* 31.3*  MCV 84.9 85.5 85.5  PLT 330 319 386   Basic Metabolic Panel:  Recent Labs Lab 06/19/16 1205  06/19/16 2015 06/20/16 0349 06/21/16 1010  NA 131* 134* 136 139  K 3.5 3.9 3.5 3.6  CL 93* 102 107 111  CO2 22 23 22 23   GLUCOSE 113* 114* 135* 156*  BUN 12 9 9 8   CREATININE 0.92 0.71 0.58 0.61  CALCIUM 8.4* 7.9* 7.7* 7.9*  MG  --  2.1  --   --   PHOS  --  2.4*  --   --    GFR: Estimated Creatinine Clearance: 80.6 mL/min (by C-G formula based on SCr of 0.8 mg/dL). Liver Function Tests:  Recent Labs Lab 06/19/16 1205 06/20/16 0349  AST 21 16  ALT 14 12*  ALKPHOS 83 66  BILITOT 0.7 0.2*  PROT 7.2 5.8*  ALBUMIN 2.7* 2.2*   No results for input(s): LIPASE, AMYLASE in the last 168 hours. No results for input(s): AMMONIA in the last 168 hours. Coagulation Profile: No results for input(s): INR, PROTIME in the last 168 hours. Cardiac  Enzymes: No results for input(s): CKTOTAL, CKMB, CKMBINDEX, TROPONINI in the last 168 hours. BNP (last 3 results) No results for input(s): PROBNP in the last 8760 hours. HbA1C: No results for input(s): HGBA1C in the last 72 hours. CBG: No results for input(s): GLUCAP in the last 168 hours. Lipid Profile: No results for input(s): CHOL, HDL, LDLCALC, TRIG, CHOLHDL, LDLDIRECT in the last 72 hours. Thyroid Function Tests: No results for input(s): TSH, T4TOTAL, FREET4, T3FREE, THYROIDAB in the last 72 hours. Anemia Panel: No results for input(s): VITAMINB12, FOLATE, FERRITIN, TIBC, IRON, RETICCTPCT in the last 72 hours. Urine analysis:    Component Value Date/Time   COLORURINE YELLOW 06/09/2016 2108   APPEARANCEUR CLOUDY (A) 06/09/2016 2108   LABSPEC 1.005 06/09/2016 2108   PHURINE 7.5 06/09/2016 2108   GLUCOSEU NEGATIVE 06/09/2016 2108   HGBUR LARGE (A) 06/09/2016 2108   BILIRUBINUR NEGATIVE 06/09/2016 2108   KETONESUR NEGATIVE 06/09/2016 2108   PROTEINUR NEGATIVE 06/09/2016 2108   UROBILINOGEN 0.2 05/01/2015 1622   NITRITE NEGATIVE 06/09/2016 2108   LEUKOCYTESUR SMALL (A) 06/09/2016 2108   Sepsis Labs: @LABRCNTIP (procalcitonin:4,lacticidven:4) ) Recent Results (from the past 240 hour(s))  C difficile quick scan w PCR reflex     Status: None   Collection Time: 06/19/16  2:00 PM  Result Value Ref Range Status   C Diff antigen NEGATIVE NEGATIVE Final   C Diff toxin NEGATIVE NEGATIVE Final   C Diff interpretation No C. difficile detected.  Final  MRSA PCR Screening     Status: None   Collection Time: 06/19/16 10:39 PM  Result Value Ref Range Status   MRSA by PCR NEGATIVE NEGATIVE Final    Comment:        The GeneXpert MRSA Assay (FDA approved for NASAL specimens only), is one component of a comprehensive MRSA colonization surveillance program. It is not intended to diagnose MRSA infection nor to guide or monitor treatment for MRSA infections.          Radiology  Studies: Dg Chest 2 View  Result Date: 06/19/2016 CLINICAL DATA:  Shortness of breath EXAM: CHEST  2 VIEW COMPARISON:  06/09/2016 FINDINGS: Right-sided PICC line with the tip projecting over the SVC. There is no focal parenchymal opacity. There is no pleural effusion or pneumothorax. The heart and mediastinal contours are unremarkable. The osseous structures are unremarkable. IMPRESSION: No active cardiopulmonary disease. 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 Sagittal and coronal MPR images reconstructed from axial data set. CONTRAST:  ISOVUE-300 IOPAMIDOL (ISOVUE-300) INJECTION 61% IV. No oral contrast administered. COMPARISON:  06/09/2016 FINDINGS: Lower chest: Tip of RIGHT arm PICC line at cavoatrial junction. Dependent atelectasis LEFT lower lobe. Hepatobiliary: Liver and gallbladder normal appearance. Pancreas: Normal appearance Spleen: Normal appearance Adrenals/Urinary Tract: LEFT hydronephrosis and hydroureter despite LEFT ureteral stent. No evidence of renal mass or calcification. RIGHT kidney, RIGHT ureter, and bladder otherwise normal appearance. Mild stranding of the prevesical fat noted, slightly improved. Adrenal glands normal appearance. Stomach/Bowel: Appendix surgically absent. Sigmoid colostomy  LEFT mid abdomen. Stomach and remaining bowel loops unremarkable. Vascular/Lymphatic: Numerous normal sized retroperitoneal and mesenteric nodes. No definite adenopathy. Aorta normal caliber. Reproductive: Uterus and unremarkable. LEFT adnexal tissue planes remain ill defined similar to prior study. Difficult to exclude subtle focal fluid collections from un opacified bowel loops in pelvis. Musculoskeletal: Unremarkable Other: No free air or free fluid. IMPRESSION: Study decreased stranding of prevesical space fat since prior study. Mild persistent LEFT hydronephrosis and hydroureter despite stenting. Numerous though normal sized mesenteric and retroperitoneal nodes, appears slightly more prominent than on previous exam. Dependent atelectasis LEFT lower lobe. Electronically Signed   By: Ulyses Southward M.D.   On: 06/19/2016 18:39      Scheduled Meds: . enoxaparin (LOVENOX) injection  40 mg Subcutaneous Q24H  . famotidine (PEPCID) IV  20 mg Intravenous Q12H  . fentaNYL  50 mcg Transdermal Q72H  . methylPREDNISolone sodium succinate  80 mg Intravenous Q6H  . oxybutynin  5 mg Oral BID  . sodium chloride flush  3 mL Intravenous Q12H   Continuous Infusions: . 0.9 % NaCl with KCl 20 mEq / L 125 mL/hr at 06/21/16 0200     LOS: 1 day    Time spent in minutes: 35    Jaylina Ramdass, MD [facility] Pager: [email or web address] Password [email or web address] 06/21/2016, 11:48 AM

## 2016-06-21 NOTE — Progress Notes (Signed)
Patient ID: Sheila Willis, female   DOB: 1986-09-01, 30 y.o.   MRN: [id number]          [facility]    Date of Admission:  06/19/2016     Principal Problem:   Allergic drug rash Active Problems:   Pelvic abscess in female   TOA (tubo-ovarian abscess)   Leukocytosis   Hyponatremia   Sinus tachycardia (HCC)   Hydronephrosis of left kidney   Allergic reaction   . enoxaparin (LOVENOX) injection  40 mg Subcutaneous Q24H  . famotidine (PEPCID) IV  20 mg Intravenous Q12H  . fentaNYL  50 mcg Transdermal Q72H  . methylPREDNISolone sodium succinate  80 mg Intravenous Q6H  . oxybutynin  5 mg Oral BID  . sodium chloride flush  3 mL Intravenous Q12H    SUBJECTIVE: She feels like she is doing worse. She is worried that the rash will "stain" her skin.  Review of Systems: Review of Systems  Constitutional: Positive for fever and malaise/fatigue. Negative for chills, diaphoresis and weight loss.  Respiratory: Negative for cough and shortness of breath.   Cardiovascular: Negative for chest pain.  Gastrointestinal: Positive for abdominal pain. Negative for diarrhea, nausea and vomiting.  Skin: Positive for itching and rash.  Neurological: Negative for headaches.    Past Medical History:  Diagnosis Date  . Depression   . Gestational diabetes   . Headache(784.0)   . PCOS (polycystic ovarian syndrome)   . Postpartum care following vaginal delivery (2/1) 11/13/2012  . SVD (spontaneous vaginal delivery) 11/13/2012    Social History  Substance Use Topics  . Smoking status: Former Smoker    Packs/day: 0.50    Types: Cigarettes    Quit date: 04/11/2016  . Smokeless tobacco: Never Used  . Alcohol use No    Family History  Problem Relation Age of Onset  . Cancer Other   . Hypertension Other   . Stroke Other   . Heart attack Other   . Heart disease Mother   . Hypertension Mother   . Cancer Mother     ovarian   Allergies  Allergen Reactions  . Latex  Itching  . Cefotetan Rash    Required hosp for rash    OBJECTIVE: Vitals:   06/20/16 2305 06/21/16 0550 06/21/16 0553 06/21/16 0800  BP:  (!) 82/59    Pulse:   79   Resp:   (!) 8   Temp: 97.8 F (36.6 C)  97.8 F (36.6 C) 97.6 F (36.4 C)  TempSrc: Oral  Oral Oral  SpO2:   100%   Weight:      Height:       Body mass index is 22.74 kg/m.  Physical Exam  Constitutional:  She is sitting up in bed. She is frustrated.  HENT:  Mouth/Throat: No oropharyngeal exudate.  No oral ulcers.  Eyes: Conjunctivae are normal.  Cardiovascular: Normal rate and regular rhythm.   No murmur heard. Pulmonary/Chest: Effort normal and breath sounds normal.  Abdominal: Soft. There is no tenderness.  Skin: Rash noted. There is erythema.  The rash does not appear to be spreading but the existing lesions are involving into more papular and erythematous confluent areas.    Lab Results Lab Results  Component Value Date   WBC 28.1 (H) 06/21/2016   HGB 10.4 (L) 06/21/2016   HCT 31.3 (L) 06/21/2016   MCV 85.5 06/21/2016   PLT 386 06/21/2016    Lab Results  Component Value Date  CREATININE 0.61 06/21/2016   BUN 8 06/21/2016   NA 139 06/21/2016   K 3.6 06/21/2016   CL 111 06/21/2016   CO2 23 06/21/2016    Lab Results  Component Value Date   ALT 12 (L) 06/20/2016   AST 16 06/20/2016   ALKPHOS 66 06/20/2016   BILITOT 0.2 (L) 06/20/2016     Microbiology: Recent Results (from the past 240 hour(s))  C difficile quick scan w PCR reflex     Status: None   Collection Time: 06/19/16  2:00 PM  Result Value Ref Range Status   C Diff antigen NEGATIVE NEGATIVE Final   C Diff toxin NEGATIVE NEGATIVE Final   C Diff interpretation No C. difficile detected.  Final  MRSA PCR Screening     Status: None   Collection Time: 06/19/16 10:39 PM  Result Value Ref Range Status   MRSA by PCR NEGATIVE NEGATIVE Final    Comment:        The GeneXpert MRSA Assay (FDA approved for NASAL specimens only), is  one component of a comprehensive MRSA colonization surveillance program. It is not intended to diagnose MRSA infection nor to guide or monitor treatment for MRSA infections.      ASSESSMENT: She has an allergic drug rash with one temperature to 101 last night. I think this is likely due to the cefotetan and she was on up until 06/16/2016. She is very frustrated but this is fairly typical of a severe drug reaction. The rash will worsen then only slowly improve over several weeks. I suspect that she will start to have superficial desquamation soon. She does not have any ocular or oral involvement to suggest Stevens-Johnson syndrome. I do not think there is any active, persistent intra-abdominal infection.  PLAN: 1. Continue observation off of antibiotics 2. I will follow-up on Monday, 06/23/2016. Please call me for any questions this weekend  Cliffton Asters, MD [facility] [phone] pager   [phone] cell 06/21/2016, 11:05 AM

## 2016-06-21 NOTE — Progress Notes (Signed)
Unable to get blood return from PICC, line flushes well, patient with no complaint. Site wnl. Notified IV therapy to come and evaluate

## 2016-06-22 DIAGNOSIS — N732 Unspecified parametritis and pelvic cellulitis: Secondary | ICD-10-CM

## 2016-06-22 MED ORDER — MIRABEGRON ER 50 MG PO TB24
50.0000 mg | ORAL_TABLET | Freq: Every day | ORAL | Status: DC
  Administered 2016-06-22 – 2016-06-26 (×5): 50 mg via ORAL
  Filled 2016-06-22 (×6): qty 1

## 2016-06-22 MED ORDER — LORATADINE 10 MG PO TABS
10.0000 mg | ORAL_TABLET | Freq: Every day | ORAL | Status: DC
  Administered 2016-06-22 – 2016-06-26 (×5): 10 mg via ORAL
  Filled 2016-06-22 (×5): qty 1

## 2016-06-22 MED ORDER — OXYCODONE HCL 5 MG/5ML PO SOLN
7.5000 mg | ORAL | Status: DC | PRN
  Administered 2016-06-22 – 2016-06-23 (×4): 7.5 mg via ORAL
  Filled 2016-06-22 (×4): qty 10

## 2016-06-22 MED ORDER — URELLE 81 MG PO TABS
1.0000 | ORAL_TABLET | Freq: Four times a day (QID) | ORAL | Status: DC
  Administered 2016-06-22 – 2016-06-26 (×17): 81 mg via ORAL
  Filled 2016-06-22 (×19): qty 1

## 2016-06-22 NOTE — Progress Notes (Signed)
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 She was discharged home on Cefotetan and Doxycyline. She has returned to the hospital for a rash that began about 5-6 ays ago which has worsened despite stopping antibiotics on 9/4.   Subjective: Feels that her face is more swollen today. No other new complaints.   Assessment & Plan:   Principal Problem:   Allergic drug rash/ SIRS - low grade fever, tachycardia, hypotension, leukocytosis - cont IV Solumedrol, Pepcid and Vistaril - increased frequency to Q 4- add Claritin today - per ID, we should continue to hold antibiotics  Active Problems:   TOA (tubo-ovarian abscess) - underwent placement of drains on 7/10 and 7/13 which have been removed- Abscess grew E coli - as mentioned above, holding antibiotics  Colostomy  - s/p colocutaneous fistula - cont wet to dry dressing on midline wound - gen surgery has not closed the wound completely and are allowing to heal by secondary intention    Leukocytosis - possibly due to drug reaction    Hyponatremia - improved with NS infusion     Hydronephrosis of left kidney - has left ureteral stent  DVT prophylaxis: Lovenox Code Status: Full  Family Communication: none Disposition Plan: follow in SDU Consultants:   ID Procedures:    Antimicrobials:  Anti-infectives    None       Objective: Vitals:   06/21/16 1523 06/21/16 1756 06/21/16 2027 06/22/16 0544  BP: 91/65 103/67 96/66 96/70   Pulse: 86 81 88 81  Resp: 16  16 15   Temp:  97.8 F (36.6 C) 97.3 F (36.3 C) 97.3 F (36.3 C)  TempSrc:  Oral Oral Oral  SpO2: 100% 100% 100% 100%  Weight:  56.2 kg (124 lb)    Height:  5\' 2"  (1.575 m)      Intake/Output Summary (Last 24 hours) at  06/22/16 1330 Last data filed at 06/22/16 0559  Gross per 24 hour  Intake               50 ml  Output                0 ml  Net               50 ml   Filed Weights   06/19/16 1157 06/19/16 2000 06/21/16 1756  Weight: 55.8 kg (123 lb) 56.4 kg (124 lb 5.4 oz) 56.2 kg (124 lb)    Examination: General exam: Appears comfortable  HEENT: PERRLA, oral mucosa moist, no sclera icterus or thrush Respiratory system: Clear to auscultation. Respiratory effort normal. Cardiovascular system: S1 & S2 heard, RRR.  No murmurs  Gastrointestinal system: Abdomen soft, non-tender, nondistended. Normal bowel sound. No organomegaly Central nervous system: Alert and oriented. No focal neurological deficits. Extremities: No cyanosis, clubbing or edema Skin: severe maculopapular rash over entire body with edema in areas where rash is most prominent Psychiatry:  Mood & affect appropriate.   Data Reviewed: I have personally reviewed following labs and imaging studies  CBC:  Recent Labs Lab 06/19/16 1205 06/20/16 0349 06/21/16 1010  WBC 19.1* 16.4* 28.1*  NEUTROABS 10.5* 10.5*  --   HGB 11.9* 9.9* 10.4*  HCT 35.3* 28.9* 31.3*  MCV 84.9 85.5 85.5  PLT 330 319 386  Basic Metabolic Panel:  Recent Labs Lab 06/19/16 1205 06/19/16 2015 06/20/16 0349 06/21/16 1010  NA 131* 134* 136 139  K 3.5 3.9 3.5 3.6  CL 93* 102 107 111  CO2 22 23 22 23   GLUCOSE 113* 114* 135* 156*  BUN 12 9 9 8   CREATININE 0.92 0.71 0.58 0.61  CALCIUM 8.4* 7.9* 7.7* 7.9*  MG  --  2.1  --   --   PHOS  --  2.4*  --   --    GFR: Estimated Creatinine Clearance: 80.6 mL/min (by C-G formula based on SCr of 0.8 mg/dL). Liver Function Tests:  Recent Labs Lab 06/19/16 1205 06/20/16 0349  AST 21 16  ALT 14 12*  ALKPHOS 83 66  BILITOT 0.7 0.2*  PROT 7.2 5.8*  ALBUMIN 2.7* 2.2*   No results for input(s): LIPASE, AMYLASE in the last 168 hours. No results for input(s): AMMONIA in the last 168 hours. Coagulation  Profile: No results for input(s): INR, PROTIME in the last 168 hours. Cardiac Enzymes: No results for input(s): CKTOTAL, CKMB, CKMBINDEX, TROPONINI in the last 168 hours. BNP (last 3 results) No results for input(s): PROBNP in the last 8760 hours. HbA1C: No results for input(s): HGBA1C in the last 72 hours. CBG: No results for input(s): GLUCAP in the last 168 hours. Lipid Profile: No results for input(s): CHOL, HDL, LDLCALC, TRIG, CHOLHDL, LDLDIRECT in the last 72 hours. Thyroid Function Tests: No results for input(s): TSH, T4TOTAL, FREET4, T3FREE, THYROIDAB in the last 72 hours. Anemia Panel: No results for input(s): VITAMINB12, FOLATE, FERRITIN, TIBC, IRON, RETICCTPCT in the last 72 hours. Urine analysis:    Component Value Date/Time   COLORURINE YELLOW 06/09/2016 2108   APPEARANCEUR CLOUDY (A) 06/09/2016 2108   LABSPEC 1.005 06/09/2016 2108   PHURINE 7.5 06/09/2016 2108   GLUCOSEU NEGATIVE 06/09/2016 2108   HGBUR LARGE (A) 06/09/2016 2108   BILIRUBINUR NEGATIVE 06/09/2016 2108   KETONESUR NEGATIVE 06/09/2016 2108   PROTEINUR NEGATIVE 06/09/2016 2108   UROBILINOGEN 0.2 05/01/2015 1622   NITRITE NEGATIVE 06/09/2016 2108   LEUKOCYTESUR SMALL (A) 06/09/2016 2108   Sepsis Labs: @LABRCNTIP (procalcitonin:4,lacticidven:4) ) Recent Results (from the past 240 hour(s))  C difficile quick scan w PCR reflex     Status: None   Collection Time: 06/19/16  2:00 PM  Result Value Ref Range Status   C Diff antigen NEGATIVE NEGATIVE Final   C Diff toxin NEGATIVE NEGATIVE Final   C Diff interpretation No C. difficile detected.  Final  MRSA PCR Screening     Status: None   Collection Time: 06/19/16 10:39 PM  Result Value Ref Range Status   MRSA by PCR NEGATIVE NEGATIVE Final    Comment:        The GeneXpert MRSA Assay (FDA approved for NASAL specimens only), is one component of a comprehensive MRSA colonization surveillance program. It is not intended to diagnose MRSA infection nor  to guide or monitor treatment for MRSA infections.          Radiology Studies: No results found.    Scheduled Meds: . enoxaparin (LOVENOX) injection  40 mg Subcutaneous Q24H  . famotidine (PEPCID) IV  20 mg Intravenous Q12H  . fentaNYL  50 mcg Transdermal Q72H  . loratadine  10 mg Oral Daily  . methylPREDNISolone sodium succinate  80 mg Intravenous Q6H  . mirabegron ER  50 mg Oral Daily  . sodium chloride flush  3 mL Intravenous Q12H  . URELLE  1 tablet Oral QID  Continuous Infusions:     LOS: 2 days    Time spent in minutes: 35    Zilphia Kozinski, MD [facility] Pager: [email or web address] Password [email or web address] 06/22/2016, 1:30 PM

## 2016-06-23 ENCOUNTER — Inpatient Hospital Stay: Payer: Commercial Managed Care - PPO | Admitting: Internal Medicine

## 2016-06-23 LAB — URINALYSIS, ROUTINE W REFLEX MICROSCOPIC
Bilirubin Urine: NEGATIVE
Glucose, UA: NEGATIVE mg/dL
Ketones, ur: NEGATIVE mg/dL
Leukocytes, UA: NEGATIVE
Nitrite: NEGATIVE
Protein, ur: NEGATIVE mg/dL
Specific Gravity, Urine: 1.023 (ref 1.005–1.030)
pH: 6.5 (ref 5.0–8.0)

## 2016-06-23 LAB — URINE MICROSCOPIC-ADD ON

## 2016-06-23 MED ORDER — METHYLPREDNISOLONE SODIUM SUCC 125 MG IJ SOLR
40.0000 mg | Freq: Four times a day (QID) | INTRAMUSCULAR | Status: DC
  Administered 2016-06-23 – 2016-06-24 (×4): 40 mg via INTRAVENOUS
  Filled 2016-06-23 (×5): qty 0.64

## 2016-06-23 MED ORDER — OXYCODONE HCL 5 MG/5ML PO SOLN
7.5000 mg | ORAL | Status: DC | PRN
  Administered 2016-06-23 – 2016-06-25 (×10): 7.5 mg via ORAL
  Filled 2016-06-23 (×11): qty 10

## 2016-06-23 MED ORDER — URELLE 81 MG PO TABS
1.0000 | ORAL_TABLET | Freq: Once | ORAL | Status: AC
  Administered 2016-06-23: 81 mg via ORAL
  Filled 2016-06-23: qty 1

## 2016-06-23 MED ORDER — ZOLPIDEM TARTRATE 5 MG PO TABS
5.0000 mg | ORAL_TABLET | Freq: Every evening | ORAL | Status: DC | PRN
  Administered 2016-06-23: 5 mg via ORAL
  Filled 2016-06-23: qty 1

## 2016-06-23 NOTE — Progress Notes (Signed)
Patient ID: Sheila Willis, female   DOB: March 13, 1986, 30 y.o.   MRN: [id number]          [facility]    Date of Admission:  06/19/2016     She has continued to be bothered by itching over the weekend. She has diffuse erythema and dry skin related to her allergic drug reaction. This will continue to resolve slowly.         Cliffton AstersJohn Ameena Vesey, MD [facility] [phone] pager   [phone] cell 10/16/2015, 1:32 PM

## 2016-06-23 NOTE — Progress Notes (Signed)
I tried to ask pt how much she had urinated and how many times she had emptied her colostomy and she refused to tell me.  Stated that she wanted to go back to sleep and chastised or waking her.

## 2016-06-23 NOTE — Progress Notes (Signed)
Pt c/o to me that she was urinating small amounts often and asked me if she could have another Eurell. I asked the provider on call, and she said she would not feel comfortable giving her anything else since she was already on 2 meds for urinary frequency.  Provider suggested I bladder scan pt in case she was retaining urine.  When I asked pt it that was ok, she said "Why the "f" do you want to do that. They did that in ED and my bladder was fine?" I replied that we needed to rule out urinary retention since she was voiding small amounts frequently.  She reluctantly allowed NT to bladder scan her.  She only had a small amount of urine in her bladder "about 70cc".  I then asked pt to urinate in a cup so we could rule out a UTI. She said that was already done also. [facility] and she only had it  3 times yesterday, provider wrote order for her to have a one time dose. She received that dose.

## 2016-06-23 NOTE — Progress Notes (Signed)
Pt did her own wet to dry dressing change yesterday around 1700. I asked pt is she was ready to change it again, she stated no.  I also asked her if she wanted us to help her change her. She replied no to that also.

## 2016-06-23 NOTE — Progress Notes (Signed)
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 She was discharged home on Cefotetan and Doxycyline. She has returned to the hospital for a rash that began about 5-6 ays ago which has worsened despite stopping antibiotics on 9/4.   Subjective: Feels that Oxycodone frequency needs to be increased. No other new complaints.   Assessment & Plan:   Principal Problem:   Allergic drug rash/ SIRS - low grade fever, tachycardia, hypotension, leukocytosis - cont IV Solumedrol, Pepcid and Vistaril - increased frequency to Q 4- added Claritin  - appears to be improving now- will start weaning steroids.  - per ID, we should continue to hold antibiotics  Active Problems:   TOA (tubo-ovarian abscess) - underwent placement of drains on 7/10 and 7/13 which have been removed- Abscess grew E coli - as mentioned above, holding antibiotics  Colostomy  - s/p colocutaneous fistula - cont wet to dry dressing on midline wound - gen surgery has not closed the wound completely and are allowing to heal by secondary intention    Leukocytosis - possibly due to drug reaction    Hyponatremia - improved with NS infusion     Hydronephrosis of left kidney - has left ureteral stent  DVT prophylaxis: Lovenox Code Status: Full  Family Communication: none Disposition Plan: follow in SDU Consultants:   ID Procedures:    Antimicrobials:  Anti-infectives    None       Objective: Vitals:   06/22/16 0544 06/22/16 1336 06/22/16 2153 06/23/16 0605  BP: 96/70 97/66 99/67  100/70  Pulse: 81 91 84 80  Resp: 15 16 16 17   Temp: 97.3 F (36.3 C) 98.4 F (36.9 C)  98 F (36.7 C)  TempSrc: Oral Oral  Oral  SpO2: 100% 99% 99% 96%  Weight:      Height:         Intake/Output Summary (Last 24 hours) at 06/23/16 1152 Last data filed at 06/23/16 0831  Gross per 24 hour  Intake              410 ml  Output              375 ml  Net               35 ml   Filed Weights   06/19/16 1157 06/19/16 2000 06/21/16 1756  Weight: 55.8 kg (123 lb) 56.4 kg (124 lb 5.4 oz) 56.2 kg (124 lb)    Examination: General exam: Appears comfortable  HEENT: PERRLA, oral mucosa moist, no sclera icterus or thrush Respiratory system: Clear to auscultation. Respiratory effort normal. Cardiovascular system: S1 & S2 heard, RRR.  No murmurs  Gastrointestinal system: Abdomen soft, non-tender, nondistended. Normal bowel sound. No organomegaly Central nervous system: Alert and oriented. No focal neurological deficits. Extremities: No cyanosis, clubbing or edema Skin: severe maculopapular rash over entire body with edema in areas where rash is most prominent- Psychiatry:  Mood & affect appropriate.   Data Reviewed: I have personally reviewed following labs and imaging studies  CBC:  Recent Labs Lab 06/19/16 1205 06/20/16 0349 06/21/16 1010  WBC 19.1* 16.4* 28.1*  NEUTROABS 10.5* 10.5*  --   HGB 11.9* 9.9* 10.4*  HCT 35.3* 28.9* 31.3*  MCV 84.9 85.5 85.5  PLT 330  319 386   Basic Metabolic Panel:  Recent Labs Lab 06/19/16 1205 06/19/16 2015 06/20/16 0349 06/21/16 1010  NA 131* 134* 136 139  K 3.5 3.9 3.5 3.6  CL 93* 102 107 111  CO2 22 23 22 23   GLUCOSE 113* 114* 135* 156*  BUN 12 9 9 8   CREATININE 0.92 0.71 0.58 0.61  CALCIUM 8.4* 7.9* 7.7* 7.9*  MG  --  2.1  --   --   PHOS  --  2.4*  --   --    GFR: Estimated Creatinine Clearance: 80.6 mL/min (by C-G formula based on SCr of 0.8 mg/dL). Liver Function Tests:  Recent Labs Lab 06/19/16 1205 06/20/16 0349  AST 21 16  ALT 14 12*  ALKPHOS 83 66  BILITOT 0.7 0.2*  PROT 7.2 5.8*  ALBUMIN 2.7* 2.2*   No results for input(s): LIPASE, AMYLASE in the last 168 hours. No results for input(s):  AMMONIA in the last 168 hours. Coagulation Profile: No results for input(s): INR, PROTIME in the last 168 hours. Cardiac Enzymes: No results for input(s): CKTOTAL, CKMB, CKMBINDEX, TROPONINI in the last 168 hours. BNP (last 3 results) No results for input(s): PROBNP in the last 8760 hours. HbA1C: No results for input(s): HGBA1C in the last 72 hours. CBG: No results for input(s): GLUCAP in the last 168 hours. Lipid Profile: No results for input(s): CHOL, HDL, LDLCALC, TRIG, CHOLHDL, LDLDIRECT in the last 72 hours. Thyroid Function Tests: No results for input(s): TSH, T4TOTAL, FREET4, T3FREE, THYROIDAB in the last 72 hours. Anemia Panel: No results for input(s): VITAMINB12, FOLATE, FERRITIN, TIBC, IRON, RETICCTPCT in the last 72 hours. Urine analysis:    Component Value Date/Time   COLORURINE GREEN (A) 06/23/2016 0826   APPEARANCEUR CLOUDY (A) 06/23/2016 0826   LABSPEC 1.023 06/23/2016 0826   PHURINE 6.5 06/23/2016 0826   GLUCOSEU NEGATIVE 06/23/2016 0826   HGBUR LARGE (A) 06/23/2016 0826   BILIRUBINUR NEGATIVE 06/23/2016 0826   KETONESUR NEGATIVE 06/23/2016 0826   PROTEINUR NEGATIVE 06/23/2016 0826   UROBILINOGEN 0.2 05/01/2015 1622   NITRITE NEGATIVE 06/23/2016 0826   LEUKOCYTESUR NEGATIVE 06/23/2016 0826   Sepsis Labs: @LABRCNTIP (procalcitonin:4,lacticidven:4) ) Recent Results (from the past 240 hour(s))  C difficile quick scan w PCR reflex     Status: None   Collection Time: 06/19/16  2:00 PM  Result Value Ref Range Status   C Diff antigen NEGATIVE NEGATIVE Final   C Diff toxin NEGATIVE NEGATIVE Final   C Diff interpretation No C. difficile detected.  Final  MRSA PCR Screening     Status: None   Collection Time: 06/19/16 10:39 PM  Result Value Ref Range Status   MRSA by PCR NEGATIVE NEGATIVE Final    Comment:        The GeneXpert MRSA Assay (FDA approved for NASAL specimens only), is one component of a comprehensive MRSA colonization surveillance program. It is  not intended to diagnose MRSA infection nor to guide or monitor treatment for MRSA infections.          Radiology Studies: No results found.    Scheduled Meds: . enoxaparin (LOVENOX) injection  40 mg Subcutaneous Q24H  . famotidine (PEPCID) IV  20 mg Intravenous Q12H  . fentaNYL  50 mcg Transdermal Q72H  . loratadine  10 mg Oral Daily  . methylPREDNISolone sodium succinate  80 mg Intravenous Q6H  . mirabegron ER  50 mg Oral Daily  . sodium chloride flush  3 mL Intravenous Q12H  . URELLE  1 tablet Oral QID   Continuous Infusions:     LOS: 3 days    Time spent in minutes: 35    Hetal Proano, MD [facility] Pager: [email or web address] Password [facility] 06/23/2016, 11:52 AM

## 2016-06-24 LAB — CBC
HEMATOCRIT: 27.5 % — AB (ref 36.0–46.0)
HEMOGLOBIN: 9.5 g/dL — AB (ref 12.0–15.0)
MCH: 28.9 pg (ref 26.0–34.0)
MCHC: 34.5 g/dL (ref 30.0–36.0)
MCV: 83.6 fL (ref 78.0–100.0)
Platelets: 324 10*3/uL (ref 150–400)
RBC: 3.29 MIL/uL — ABNORMAL LOW (ref 3.87–5.11)
RDW: 18.8 % — AB (ref 11.5–15.5)
WBC: 29.7 10*3/uL — AB (ref 4.0–10.5)

## 2016-06-24 LAB — BASIC METABOLIC PANEL
ANION GAP: 5 (ref 5–15)
BUN: 16 mg/dL (ref 6–20)
CALCIUM: 8 mg/dL — AB (ref 8.9–10.3)
CHLORIDE: 105 mmol/L (ref 101–111)
CO2: 28 mmol/L (ref 22–32)
CREATININE: 0.54 mg/dL (ref 0.44–1.00)
GFR calc non Af Amer: >60 mL/min (ref >60)
Glucose, Bld: 148 mg/dL — ABNORMAL HIGH (ref 65–99)
Potassium: 3.7 mmol/L (ref 3.5–5.1)
SODIUM: 138 mmol/L (ref 135–145)

## 2016-06-24 MED ORDER — METHYLPREDNISOLONE SODIUM SUCC 40 MG IJ SOLR
40.0000 mg | Freq: Four times a day (QID) | INTRAMUSCULAR | Status: DC
  Administered 2016-06-24: 40 mg via INTRAVENOUS
  Filled 2016-06-24: qty 1

## 2016-06-24 MED ORDER — METHYLPREDNISOLONE SODIUM SUCC 40 MG IJ SOLR
40.0000 mg | Freq: Two times a day (BID) | INTRAMUSCULAR | Status: AC
  Administered 2016-06-24 – 2016-06-25 (×3): 40 mg via INTRAVENOUS
  Filled 2016-06-24 (×3): qty 1

## 2016-06-24 NOTE — Progress Notes (Signed)
Patient ID: Sheila Willis, female   DOB: Dec 09, 1985, 30 y.o.   MRN: [id number]          [facility]    Date of Admission:  06/19/2016     Principal Problem:   Allergic drug rash Active Problems:   Pelvic abscess in female   TOA (tubo-ovarian abscess)   Leukocytosis   Hyponatremia   Sinus tachycardia (HCC)   Hydronephrosis of left kidney   Allergic reaction   . enoxaparin (LOVENOX) injection  40 mg Subcutaneous Q24H  . famotidine (PEPCID) IV  20 mg Intravenous Q12H  . fentaNYL  50 mcg Transdermal Q72H  . loratadine  10 mg Oral Daily  . methylPREDNISolone sodium succinate  40 mg Intravenous Q6H  . mirabegron ER  50 mg Oral Daily  . sodium chloride flush  3 mL Intravenous Q12H  . URELLE  1 tablet Oral QID    SUBJECTIVE: She states that she feels about the same. She is still bothered by itching but it is not as severe.  Review of Systems: Review of Systems  Constitutional: Positive for malaise/fatigue. Negative for chills, diaphoresis, fever and weight loss.  HENT: Negative for sore throat.   Respiratory: Negative for cough, sputum production and shortness of breath.   Cardiovascular: Negative for chest pain.  Gastrointestinal: Positive for abdominal pain. Negative for diarrhea, nausea and vomiting.  Musculoskeletal: Negative for joint pain and myalgias.  Skin: Positive for itching and rash.  Neurological: Negative for headaches.    Past Medical History:  Diagnosis Date  . Depression   . Gestational diabetes   . Headache(784.0)   . PCOS (polycystic ovarian syndrome)   . Postpartum care following vaginal delivery (2/1) 11/13/2012  . SVD (spontaneous vaginal delivery) 11/13/2012    Social History  Substance Use Topics  . Smoking status: Former Smoker    Packs/day: 0.50    Types: Cigarettes    Quit date: 04/11/2016  . Smokeless tobacco: Never Used  . Alcohol use No    Family History  Problem Relation Age of Onset  . Cancer Other   .  Hypertension Other   . Stroke Other   . Heart attack Other   . Heart disease Mother   . Hypertension Mother   . Cancer Mother     ovarian   Allergies  Allergen Reactions  . Latex Itching  . Cefotetan Rash    Required hosp for rash    OBJECTIVE: Vitals:   06/23/16 1337 06/23/16 2135 06/24/16 0610 06/24/16 1401  BP: 101/66 105/71 100/68 99/64  Pulse: 84 84 63 75  Resp: 16 16 16 18   Temp: 98.1 F (36.7 C) 98.7 F (37.1 C) 98.4 F (36.9 C) 98.3 F (36.8 C)  TempSrc: Oral Oral Oral Oral  SpO2: 100% 100% 100% 100%  Weight:      Height:       Body mass index is 22.68 kg/m.  Physical Exam  Constitutional: No distress.  She is sitting up in bed eating lunch.  HENT:  Mouth/Throat: No oropharyngeal exudate.  Cardiovascular: Normal rate and regular rhythm.   No murmur heard. Pulmonary/Chest: Effort normal and breath sounds normal.  Abdominal: Soft. There is tenderness.  Mild left-sided tenderness unchanged.  Skin:  Her diffuse erythematous maculopapular rash is better over the past week. She has some superficial desquamation on her face.    Lab Results Lab Results  Component Value Date   WBC 29.7 (H) 06/24/2016   HGB 9.5 (L) 06/24/2016  HCT 27.5 (L) 06/24/2016   MCV 83.6 06/24/2016   PLT 324 06/24/2016    Lab Results  Component Value Date   CREATININE 0.54 06/24/2016   BUN 16 06/24/2016   NA 138 06/24/2016   K 3.7 06/24/2016   CL 105 06/24/2016   CO2 28 06/24/2016    Lab Results  Component Value Date   ALT 12 (L) 06/20/2016   AST 16 06/20/2016   ALKPHOS 66 06/20/2016   BILITOT 0.2 (L) 06/20/2016     Microbiology: Recent Results (from the past 240 hour(s))  C difficile quick scan w PCR reflex     Status: None   Collection Time: 06/19/16  2:00 PM  Result Value Ref Range Status   C Diff antigen NEGATIVE NEGATIVE Final   C Diff toxin NEGATIVE NEGATIVE Final   C Diff interpretation No C. difficile detected.  Final  MRSA PCR Screening     Status: None     Collection Time: 06/19/16 10:39 PM  Result Value Ref Range Status   MRSA by PCR NEGATIVE NEGATIVE Final    Comment:        The GeneXpert MRSA Assay (FDA approved for NASAL specimens only), is one component of a comprehensive MRSA colonization surveillance program. It is not intended to diagnose MRSA infection nor to guide or monitor treatment for MRSA infections.      ASSESSMENT: She is improving very slowly on steroids for a probable allergic drug rash to cefotetan. She has no evidence of any active intra-abdominal infection. I would continue observation off of antibiotics.  PLAN: 1. Continue observation off of antibiotics 2. I will sign off now but please call if I can be of further assistance while she is here  Cliffton AstersJohn Kadden Osterhout, MD [facility] [phone] pager   [phone] cell 06/24/2016, 3:31 PM

## 2016-06-24 NOTE — Progress Notes (Addendum)
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 She was discharged home on Cefotetan and Doxycyline. She has returned to the hospital for a rash that began about 5-6 ays ago which has worsened despite stopping antibiotics on 9/4.   Subjective:   No other new complaints today. Itching and pain relatively well controlled. She continues to insist that she is not getting any better.   Assessment & Plan:   Principal Problem:   Allergic drug rash/ SIRS - low grade fever, tachycardia, hypotension, leukocytosis - cont IV Solumedrol, Pepcid and Vistaril - increased frequency to Q 4- added Claritin - appears to be improving now - will continue weaning steroids- we can transition to Prednisone in 24-48 hrs - appreciate ID eval- per ID, we should continue to hold antibiotics - fevers resolved- rash is less erythematous- she is still quite edematous   Active Problems:   TOA (tubo-ovarian abscess) - underwent placement of drains on 7/10 and 7/13 which have been removed- Abscess grew E coli - as mentioned above, holding antibiotics  Colostomy  - s/p colocutaneous fistula - cont wet to dry dressing on midline wound - gen surgery has not closed the wound completely and are allowing to heal by secondary intention    Leukocytosis - possibly due to drug reaction    Hyponatremia - improved with NS infusion     Hydronephrosis of left kidney - has left ureteral stent  DVT prophylaxis: Lovenox Code Status: Full  Family Communication: none Disposition Plan:  Home in 1-2 days Consultants:   ID Procedures:    Antimicrobials:  Anti-infectives    None     Objective: Vitals:   06/23/16 1337 06/23/16 2135 06/24/16 0610 06/24/16 1401  BP: 101/66 105/71  100/68 99/64  Pulse: 84 84 63 75  Resp: 16 16 16 18   Temp: 98.1 F (36.7 C) 98.7 F (37.1 C) 98.4 F (36.9 C) 98.3 F (36.8 C)  TempSrc: Oral Oral Oral Oral  SpO2: 100% 100% 100% 100%  Weight:      Height:        Intake/Output Summary (Last 24 hours) at 06/24/16 1539 Last data filed at 06/24/16 0600  Gross per 24 hour  Intake              470 ml  Output                0 ml  Net              470 ml   Filed Weights   06/19/16 1157 06/19/16 2000 06/21/16 1756  Weight: 55.8 kg (123 lb) 56.4 kg (124 lb 5.4 oz) 56.2 kg (124 lb)    Examination: General exam: Appears comfortable  HEENT: PERRLA, oral mucosa moist, no sclera icterus or thrush Respiratory system: Clear to auscultation. Respiratory effort normal. Cardiovascular system: S1 & S2 heard, RRR.  No murmurs  Gastrointestinal system: Abdomen soft, non-tender, nondistended. Normal bowel sound. No organomegaly Central nervous system: Alert and oriented. No focal neurological deficits. Extremities: No cyanosis, clubbing or edema Skin: severe maculopapular rash over entire body with edema in areas where rash is most prominent- rash noted to be more purple brown now rather than bright red Psychiatry:  Mood & affect appropriate.   Data Reviewed:  I have personally reviewed following labs and imaging studies  CBC:  Recent Labs Lab 06/19/16 1205 06/20/16 0349 06/21/16 1010 06/24/16 0445  WBC 19.1* 16.4* 28.1* 29.7*  NEUTROABS 10.5* 10.5*  --   --   HGB 11.9* 9.9* 10.4* 9.5*  HCT 35.3* 28.9* 31.3* 27.5*  MCV 84.9 85.5 85.5 83.6  PLT 330 319 386 324   Basic Metabolic Panel:  Recent Labs Lab 06/19/16 1205 06/19/16 2015 06/20/16 0349 06/21/16 1010 06/24/16 0445  NA 131* 134* 136 139 138  K 3.5 3.9 3.5 3.6 3.7  CL 93* 102 107 111 105  CO2 22 23 22 23 28   GLUCOSE 113* 114* 135* 156* 148*  BUN 12 9 9 8 16   CREATININE 0.92 0.71 0.58 0.61 0.54  CALCIUM 8.4* 7.9* 7.7* 7.9* 8.0*  MG  --  2.1  --   --   --   PHOS  --   2.4*  --   --   --    GFR: Estimated Creatinine Clearance: 80.6 mL/min (by C-G formula based on SCr of 0.8 mg/dL). Liver Function Tests:  Recent Labs Lab 06/19/16 1205 06/20/16 0349  AST 21 16  ALT 14 12*  ALKPHOS 83 66  BILITOT 0.7 0.2*  PROT 7.2 5.8*  ALBUMIN 2.7* 2.2*   No results for input(s): LIPASE, AMYLASE in the last 168 hours. No results for input(s): AMMONIA in the last 168 hours. Coagulation Profile: No results for input(s): INR, PROTIME in the last 168 hours. Cardiac Enzymes: No results for input(s): CKTOTAL, CKMB, CKMBINDEX, TROPONINI in the last 168 hours. BNP (last 3 results) No results for input(s): PROBNP in the last 8760 hours. HbA1C: No results for input(s): HGBA1C in the last 72 hours. CBG: No results for input(s): GLUCAP in the last 168 hours. Lipid Profile: No results for input(s): CHOL, HDL, LDLCALC, TRIG, CHOLHDL, LDLDIRECT in the last 72 hours. Thyroid Function Tests: No results for input(s): TSH, T4TOTAL, FREET4, T3FREE, THYROIDAB in the last 72 hours. Anemia Panel: No results for input(s): VITAMINB12, FOLATE, FERRITIN, TIBC, IRON, RETICCTPCT in the last 72 hours. Urine analysis:    Component Value Date/Time   COLORURINE GREEN (A) 06/23/2016 0826   APPEARANCEUR CLOUDY (A) 06/23/2016 0826   LABSPEC 1.023 06/23/2016 0826   PHURINE 6.5 06/23/2016 0826   GLUCOSEU NEGATIVE 06/23/2016 0826   HGBUR LARGE (A) 06/23/2016 0826   BILIRUBINUR NEGATIVE 06/23/2016 0826   KETONESUR NEGATIVE 06/23/2016 0826   PROTEINUR NEGATIVE 06/23/2016 0826   UROBILINOGEN 0.2 05/01/2015 1622   NITRITE NEGATIVE 06/23/2016 0826   LEUKOCYTESUR NEGATIVE 06/23/2016 0826   Sepsis Labs: @LABRCNTIP (procalcitonin:4,lacticidven:4) ) Recent Results (from the past 240 hour(s))  C difficile quick scan w PCR reflex     Status: None   Collection Time: 06/19/16  2:00 PM  Result Value Ref Range Status   C Diff antigen NEGATIVE NEGATIVE Final   C Diff toxin NEGATIVE NEGATIVE  Final   C Diff interpretation No C. difficile detected.  Final  MRSA PCR Screening     Status: None   Collection Time: 06/19/16 10:39 PM  Result Value Ref Range Status   MRSA by PCR NEGATIVE NEGATIVE Final    Comment:        The GeneXpert MRSA Assay (FDA approved for NASAL specimens only), is one component of a comprehensive MRSA colonization surveillance program. It is not intended to diagnose MRSA infection nor to guide or monitor treatment for MRSA infections.          Radiology  Studies: No results found.    Scheduled Meds: . enoxaparin (LOVENOX) injection  40 mg Subcutaneous Q24H  . famotidine (PEPCID) IV  20 mg Intravenous Q12H  . fentaNYL  50 mcg Transdermal Q72H  . loratadine  10 mg Oral Daily  . methylPREDNISolone sodium succinate  40 mg Intravenous Q6H  . mirabegron ER  50 mg Oral Daily  . sodium chloride flush  3 mL Intravenous Q12H  . URELLE  1 tablet Oral QID   Continuous Infusions:     LOS: 4 days    Time spent in minutes: 35    Andjela Wickes, MD [facility] Pager: [email or web address] Password [facility] 06/24/2016, 3:39 PM

## 2016-06-25 DIAGNOSIS — T7840XD Allergy, unspecified, subsequent encounter: Secondary | ICD-10-CM

## 2016-06-25 DIAGNOSIS — D72829 Elevated white blood cell count, unspecified: Secondary | ICD-10-CM

## 2016-06-25 DIAGNOSIS — T50995A Adverse effect of other drugs, medicaments and biological substances, initial encounter: Secondary | ICD-10-CM

## 2016-06-25 DIAGNOSIS — N7093 Salpingitis and oophoritis, unspecified: Secondary | ICD-10-CM

## 2016-06-25 DIAGNOSIS — E871 Hypo-osmolality and hyponatremia: Secondary | ICD-10-CM

## 2016-06-25 DIAGNOSIS — N1330 Unspecified hydronephrosis: Secondary | ICD-10-CM

## 2016-06-25 MED ORDER — OXYCODONE HCL 5 MG PO TABS: 5.0000 mg | ORAL_TABLET | ORAL | Status: DC | PRN

## 2016-06-25 MED ORDER — PREDNISONE 20 MG PO TABS
40.0000 mg | ORAL_TABLET | Freq: Every day | ORAL | Status: DC
  Administered 2016-06-26: 40 mg via ORAL
  Filled 2016-06-25: qty 2

## 2016-06-25 MED ORDER — MORPHINE SULFATE 15 MG PO TABS
15.0000 mg | ORAL_TABLET | ORAL | Status: DC | PRN
  Administered 2016-06-25 – 2016-06-26 (×3): 15 mg via ORAL
  Filled 2016-06-25 (×3): qty 1

## 2016-06-25 NOTE — Progress Notes (Signed)
06/25/16  1600  Patient requesting to take a shower. Patient advised since she has a PICC line that she would not  Be able to shower. Patient states that on other admissions she was able to shower. Spoke with MD, MD plans to D/C patient tomorrow and does not want her to shower with PICC.

## 2016-06-25 NOTE — Progress Notes (Signed)
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 She was discharged home on Cefotetan and Doxycyline. She has returned to the hospital for a rash that began about 5-6 ays ago which has worsened despite stopping antibiotics on 9/4.   Subjective:   No other new complaints today. Itching and pain relatively well controlled.  Pt wants to shower today.    Assessment & Plan:     Allergic drug rash/ SIRS - low grade fever, tachycardia, hypotension, all resolved - cont IV Solumedrol, Pepcid and Vistaril - increased frequency to Q 4- added Claritin - appears to be improving now - will continue weaning steroids- we can transition to Prednisone in AM - appreciate ID eval- per ID, we should continue to hold antibiotics - fevers resolved- rash is less erythematous   TOA (tubo-ovarian abscess) - underwent placement of drains on 7/10 and 7/13 which have been removed- Abscess grew E coli - as mentioned above, holding antibiotics  Colostomy  - s/p colocutaneous fistula - cont wet to dry dressing on midline wound - gen surgery has not closed the wound completely and are allowing to heal by secondary intention    Leukocytosis - possibly due to drug reaction    Hyponatremia - improved with NS infusion     Hydronephrosis of left kidney - has left ureteral stent  DVT prophylaxis: Lovenox Code Status: Full  Family Communication: none Disposition Plan:  Home tomorrow if stable Consultants:   ID Procedures:    Antimicrobials:  Anti-infectives    None     Objective: Vitals:   06/24/16 0610 06/24/16 1401 06/24/16 2136 06/25/16 0537  BP: 100/68 99/64 106/70 100/63  Pulse: 63 75 74 62  Resp: 16 18 16 16   Temp: 98.4 F (36.9 C) 98.3 F (36.8 C) 98.6 F  (37 C) 98.2 F (36.8 C)  TempSrc: Oral Oral Oral Oral  SpO2: 100% 100% 100% 99%  Weight:      Height:        Intake/Output Summary (Last 24 hours) at 06/25/16 1506 Last data filed at 06/25/16 1000  Gross per 24 hour  Intake              820 ml  Output              300 ml  Net              520 ml   Filed Weights   06/19/16 1157 06/19/16 2000 06/21/16 1756  Weight: 55.8 kg (123 lb) 56.4 kg (124 lb 5.4 oz) 56.2 kg (124 lb)    Examination: General exam: Appears comfortable  HEENT: PERRLA, oral mucosa moist, no sclera icterus or thrush Respiratory system: Clear to auscultation. Respiratory effort normal. Cardiovascular system: S1 & S2 heard, RRR.  No murmurs  Gastrointestinal system: Abdomen soft, non-tender, nondistended. Normal bowel sound. No organomegaly Central nervous system: Alert and oriented. No focal neurological deficits. Extremities: No cyanosis, clubbing or edema Skin: maculopapular rash over entire body with edema in areas where rash is most prominent- rash noted to be more purple brown now rather than bright red Psychiatry:  Mood & affect appropriate.   Data Reviewed: I have personally reviewed following labs and imaging studies  CBC:  Recent Labs Lab 06/19/16 1205 06/20/16  [id number] 06/21/16 1010 06/24/16 0445  WBC 19.1* 16.4* 28.1* 29.7*  NEUTROABS 10.5* 10.5*  --   --   HGB 11.9* 9.9* 10.4* 9.5*  HCT 35.3* 28.9* 31.3* 27.5*  MCV 84.9 85.5 85.5 83.6  PLT 330 319 386 324   Basic Metabolic Panel:  Recent Labs Lab 06/19/16 1205 06/19/16 2015 06/20/16 0349 06/21/16 1010 06/24/16 0445  NA 131* 134* 136 139 138  K 3.5 3.9 3.5 3.6 3.7  CL 93* 102 107 111 105  CO2 22 23 22 23 28   GLUCOSE 113* 114* 135* 156* 148*  BUN 12 9 9 8 16   CREATININE 0.92 0.71 0.58 0.61 0.54  CALCIUM 8.4* 7.9* 7.7* 7.9* 8.0*  MG  --  2.1  --   --   --   PHOS  --  2.4*  --   --   --    GFR: Estimated Creatinine Clearance: 80.6 mL/min (by C-G formula based on SCr of 0.54  mg/dL). Liver Function Tests:  Recent Labs Lab 06/19/16 1205 06/20/16 0349  AST 21 16  ALT 14 12*  ALKPHOS 83 66  BILITOT 0.7 0.2*  PROT 7.2 5.8*  ALBUMIN 2.7* 2.2*   No results for input(s): LIPASE, AMYLASE in the last 168 hours. No results for input(s): AMMONIA in the last 168 hours. Coagulation Profile: No results for input(s): INR, PROTIME in the last 168 hours. Cardiac Enzymes: No results for input(s): CKTOTAL, CKMB, CKMBINDEX, TROPONINI in the last 168 hours. BNP (last 3 results) No results for input(s): PROBNP in the last 8760 hours. HbA1C: No results for input(s): HGBA1C in the last 72 hours. CBG: No results for input(s): GLUCAP in the last 168 hours. Lipid Profile: No results for input(s): CHOL, HDL, LDLCALC, TRIG, CHOLHDL, LDLDIRECT in the last 72 hours. Thyroid Function Tests: No results for input(s): TSH, T4TOTAL, FREET4, T3FREE, THYROIDAB in the last 72 hours. Anemia Panel: No results for input(s): VITAMINB12, FOLATE, FERRITIN, TIBC, IRON, RETICCTPCT in the last 72 hours. Urine analysis:    Component Value Date/Time   COLORURINE GREEN (A) 06/23/2016 0826   APPEARANCEUR CLOUDY (A) 06/23/2016 0826   LABSPEC 1.023 06/23/2016 0826   PHURINE 6.5 06/23/2016 0826   GLUCOSEU NEGATIVE 06/23/2016 0826   HGBUR LARGE (A) 06/23/2016 0826   BILIRUBINUR NEGATIVE 06/23/2016 0826   KETONESUR NEGATIVE 06/23/2016 0826   PROTEINUR NEGATIVE 06/23/2016 0826   UROBILINOGEN 0.2 05/01/2015 1622   NITRITE NEGATIVE 06/23/2016 0826   LEUKOCYTESUR NEGATIVE 06/23/2016 0826   Sepsis Labs: @LABRCNTIP (procalcitonin:4,lacticidven:4) ) Recent Results (from the past 240 hour(s))  C difficile quick scan w PCR reflex     Status: None   Collection Time: 06/19/16  2:00 PM  Result Value Ref Range Status   C Diff antigen NEGATIVE NEGATIVE Final   C Diff toxin NEGATIVE NEGATIVE Final   C Diff interpretation No C. difficile detected.  Final  MRSA PCR Screening     Status: None    Collection Time: 06/19/16 10:39 PM  Result Value Ref Range Status   MRSA by PCR NEGATIVE NEGATIVE Final    Comment:        The GeneXpert MRSA Assay (FDA approved for NASAL specimens only), is one component of a comprehensive MRSA colonization surveillance program. It is not intended to diagnose MRSA infection nor to guide or monitor treatment for MRSA infections.          Radiology Studies: No results found.  Scheduled Meds: . enoxaparin (LOVENOX) injection  40 mg Subcutaneous Q24H  .  famotidine (PEPCID) IV  20 mg Intravenous Q12H  . fentaNYL  50 mcg Transdermal Q72H  . loratadine  10 mg Oral Daily  . methylPREDNISolone sodium succinate  40 mg Intravenous Q12H  . mirabegron ER  50 mg Oral Daily  . [START ON 06/26/2016] predniSONE  40 mg Oral Q breakfast  . sodium chloride flush  3 mL Intravenous Q12H  . URELLE  1 tablet Oral QID   Continuous Infusions:    LOS: 5 days   Time spent in minutes: 22  Standley Dakins, MD [facility] Pager: [email or web address] Password [email or web address] 06/25/2016, 3:06 PM

## 2016-06-26 DIAGNOSIS — R000 Tachycardia, unspecified: Secondary | ICD-10-CM

## 2016-06-26 LAB — CREATININE, SERUM: Creatinine, Ser: 0.67 mg/dL (ref 0.44–1.00)

## 2016-06-26 MED ORDER — CAMPHOR-MENTHOL 0.5-0.5 % EX LOTN: TOPICAL_LOTION | CUTANEOUS | 0 refills | Status: DC | PRN

## 2016-06-26 MED ORDER — MORPHINE SULFATE 15 MG PO TABS: 15.0000 mg | ORAL_TABLET | ORAL | 0 refills | Status: DC | PRN

## 2016-06-26 MED ORDER — FENTANYL 50 MCG/HR TD PT72: 50.0000 ug | MEDICATED_PATCH | TRANSDERMAL | 0 refills | Status: DC

## 2016-06-26 MED ORDER — LORATADINE 10 MG PO TABS
10.0000 mg | ORAL_TABLET | Freq: Every day | ORAL | 0 refills | Status: DC
Start: 2016-06-27 — End: 2017-01-21

## 2016-06-26 MED ORDER — HYDROXYZINE HCL 25 MG PO TABS: 25.0000 mg | ORAL_TABLET | Freq: Three times a day (TID) | ORAL | 0 refills | Status: DC | PRN

## 2016-06-26 MED ORDER — PREDNISONE 20 MG PO TABS: ORAL_TABLET | ORAL | 0 refills | Status: DC

## 2016-06-26 NOTE — Discharge Instructions (Signed)
Erythema Multiforme Erythema multiforme is a rash that usually occurs on the skin, but can also occur on the lips and on the inside of the mouth. It is usually a mild condition that goes away on its own. It most often affects young adults and children. The rash shows up suddenly and often lasts 1-4 weeks. In some cases, the rash may come back again after clearing up. CAUSES  The cause of erythema multiforme may be an overreaction by the body's immune system to a trigger.  Common triggers include:   Infection, most commonly by the cold sore virus (human herpes virus, HSV), bacteria, or fungus. Less common triggers include:   Medicines.   Other illnesses.  In some cases, the cause may not be known.  SIGNS AND SYMPTOMS  The rash from erythema multiforme shows up suddenly. It may appear days after exposure to the trigger. It may start as small, red, round or oval marks that become bumps or raised welts over 24-48 hours. These bumps may resemble a target or a "bull's eye." These can spread and be quite large (about 1 inch [2.5 cm]). There may be mild itching or burning of the skin at first.  These skin changes usually appear first on the backs of the hands. They may then spread to the tops of the feet, the arms, the elbows, the knees, the palms, and the soles of the feet. There may be a mild rash on the lips and lining of the mouth. The skin rash may show up in waves over a few days.  It may take 2-4 weeks for the rash to go away. The rash may return at a later time.  DIAGNOSIS  Diagnosis of erythema multiforme is usually made based on a physical exam and medical history. To help confirm the diagnosis, a small piece of skin tissue is sometimes removed (skin biopsy) so it can be examined under a microscope by a specialist (pathologist). TREATMENT  Most episodes of erythema multiforme heal on their own. Treatment may not be needed. Your health care provider will recommend removing or avoiding the  trigger if possible. If the trigger is an infection or other illness, you may receive treatment for that infection or illness. You may also be given medicine for itching. Other medicines may be used for severe cases or to help prevent repeat bouts of erythema multiforme.  HOME CARE INSTRUCTIONS   Take medicines only as directed by your health care provider.   If possible, avoid known triggers.   If a medicine was your trigger, be sure to notify all of your health care providers. You should avoid this medicine or any like it in the future.   If your trigger was a herpes virus infection, use sunscreen lotion and sunscreen-containing lip balm to prevent sunlight triggered outbreaks of herpes virus.   Apply moist compresses as needed to help control itching. Cool or warm baths may also help. Avoid hot baths or showers.   Eat soft foods if you have mouth sores.   Keep all follow-up visits as directed by your health care provider. This is important.  SEEK MEDICAL CARE IF:   Your rash shows up again in the future.  You have a fever. SEEK IMMEDIATE MEDICAL CARE IF:   You develop redness and swelling on your lips or in your mouth.  You have a burning feeling on your lips or in your mouth.  You develop blisters or open sores on your mouth, lips, vagina, penis, or   anus.  You have eye pain, or you have redness or drainage in your eye.  You develop blisters on your skin.  You have difficulty breathing.  You have difficulty swallowing, or you start drooling.  You have blood in your urine.  You have pain with urination.   This information is not intended to replace advice given to you by your health care provider. Make sure you discuss any questions you have with your health care provider.   Document Released: 09/29/2005 Document Revised: 10/20/2014 Document Reviewed: 05/23/2014 [employer] Interactive Patient Education 2016 [employer]  

## 2016-06-26 NOTE — Discharge Summary (Signed)
Physician Discharge Summary  Sheila Willis JXB:[id number] DOB: 1985-11-28 DOA: 06/19/2016  PCP: Lendon Colonel., MD  Admit date: 06/19/2016 Discharge date: 06/26/2016  Disposition:  HOME  Recommendations for Outpatient Follow-up:  1. Follow up with PCP in 5 days for recheck 2. Please obtain BMP/CBC on follow up 3. 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 She was discharged home on Cefotetan and Doxycyline. She has returned to the hospital for a rash that began about 5-6 ays ago which has worsened despite stopping antibiotics on 9/4.   Assessment & Plan:     Allergic drug rash/ SIRS - low grade fever, tachycardia, hypotension, all resolved - will continue weaning steroids- transitioned to oral Prednisone prior to discharge - appreciate ID eval- per ID, we should continue to hold antibiotics - fevers resolved- rash is less erythematous   TOA (tubo-ovarian abscess) - underwent placement of drains on 7/10 and 7/13 which have been removed- Abscess grew E coli - Fully treated - as mentioned above, per ID recs discontinued all antibiotics and monitored for 48 hours, has remained stable.   Colostomy  - s/p colocutaneous fistula - cont wet to dry dressing on midline wound - gen surgery has not closed the wound completely and are allowing to heal by secondary intention - Pt to follow up with Dr. Abbey Chatters    Leukocytosis - suspect secondary to drug reaction and steroid induced; no active infections.    Hyponatremia - improved with NS infusion     Hydronephrosis of left kidney - has left ureteral stent  DVT prophylaxis: Lovenox Code Status: Full  Family Communication: bedside Disposition Plan:   Home Consultants:   ID  Discharge Diagnoses:  Principal Problem:   Allergic drug rash Active Problems:   TOA (tubo-ovarian abscess)   Pelvic abscess in female   Leukocytosis   Hyponatremia   Sinus tachycardia (HCC)   Hydronephrosis of left kidney   Allergic reaction  Discharge Instructions     Medication List    STOP taking these medications   acetaminophen 325 MG tablet Commonly known as:  TYLENOL   clonazePAM 0.5 MG tablet Commonly known as:  KLONOPIN   doxycycline 100 MG tablet Commonly known as:  VIBRA-TABS   saccharomyces boulardii 250 MG capsule Commonly known as:  FLORASTOR     TAKE these medications   camphor-menthol lotion Commonly known as:  SARNA Apply topically as needed for itching.   fentaNYL 50 MCG/HR Commonly known as:  DURAGESIC - dosed mcg/hr Place 1 patch (50 mcg total) onto the skin every 3 (three) days.   hydrOXYzine 25 MG tablet Commonly known as:  ATARAX/VISTARIL Take 1 tablet (25 mg total) by mouth every 8 (eight) hours as needed for itching.   loratadine 10 MG tablet Commonly known as:  CLARITIN Take 1 tablet (10 mg total) by mouth daily. Start taking on:  06/27/2016   mirabegron ER 50 MG Tb24 tablet Commonly known as:  MYRBETRIQ Take 1 tablet (50 mg total) by mouth daily.   morphine 15 MG tablet Commonly known as:  MSIR Take 1 tablet (15 mg total) by mouth every 4 (four) hours as needed for severe pain.   ondansetron 8 MG disintegrating tablet Commonly known as:  ZOFRAN-ODT Take 1 tablet (8 mg total)  by mouth 2 (two) times daily.   polyethylene glycol packet Commonly known as:  MIRALAX / GLYCOLAX You can follow package instructions and use if you think your constipated.  Right now you do not really need it.  The Pain medicine can cause you to be constipated.  You can buy at any drug store over the counter.   predniSONE 20 MG tablet Commonly known as:  DELTASONE Take 2 tabs po daily x 3 days, then take 1 tab po daily x 4  days, then STOP.   URELLE 81 MG Tabs tablet Take 1 tablet (81 mg total) by mouth 4 (four) times daily.      Follow-up Information    Noland Fordyce A., MD. Schedule an appointment as soon as possible for a visit in 5 day(s).   Specialty:  Obstetrics and Gynecology Why:  Hospital Follow Up Contact information: [address] [place] [address] [phone]        Adolph Pollack, MD. Schedule an appointment as soon as possible for a visit in 1 week(s).   Specialty:  General Surgery Why:  Hospital Follow Up  Contact information: [address] [place] [address] [phone]          Allergies  Allergen Reactions  . Latex Itching  . Cefotetan Rash    Required hosp for rash   Procedures/Studies: Dg Chest 2 View  Result Date: 06/19/2016 CLINICAL DATA:  Shortness of breath EXAM: CHEST  2 VIEW COMPARISON:  06/09/2016 FINDINGS: Right-sided PICC line with the tip projecting over the SVC. There is no focal parenchymal opacity. There is no pleural effusion or pneumothorax. The heart and mediastinal contours are unremarkable. The osseous structures are unremarkable. IMPRESSION: No active cardiopulmonary disease. Electronically Signed   By: Elige Ko   On: 06/19/2016 13:25   Dg Chest 2 View  Result Date: 06/09/2016 CLINICAL DATA:  Patient states she had abdominal surgery several weeks ago and has colostomy and open abdominal wound, she is having increased abdominal pain and fever this pm, pain is upper abdomen EXAM: CHEST  2 VIEW COMPARISON:  05/14/2016 FINDINGS: Right PICC catheter with tip over the low SVC region. No pneumothorax. Normal heart size and pulmonary vascularity. No focal airspace disease or consolidation in the lungs. No blunting of costophrenic angles. No pneumothorax. Mediastinal contours appear intact. IMPRESSION: No active cardiopulmonary disease. Electronically Signed   By: Burman Nieves M.D.   On: 06/09/2016 22:06   Ct Abdomen Pelvis W  Contrast  Result Date: 06/19/2016 CLINICAL DATA:  Recently started antibiotics through PICC line on 06/13/2016, developed allergic reaction, rash at RIGHT arm, stopped antibiotics on upper but rash has spread all over body, no relief with Benadryl, onset of shortness of breath 2 days ago, tachycardia, recent surgery and stent placement for kidney 05/25/2016, post colectomy with colostomy 05/15/2016 for colocutaneous fistula post tubo-ovarian abscess EXAM: CT ABDOMEN AND PELVIS WITH CONTRAST TECHNIQUE: Multidetector CT imaging of the abdomen and pelvis was performed using the standard protocol following bolus administration of intravenous contrast. Sagittal and coronal MPR images reconstructed from axial data set. CONTRAST:  ISOVUE-300 IOPAMIDOL (ISOVUE-300) INJECTION 61% IV. No oral contrast administered. COMPARISON:  06/09/2016 FINDINGS: Lower chest: Tip of RIGHT arm PICC line at cavoatrial junction. Dependent atelectasis LEFT lower lobe. Hepatobiliary: Liver and gallbladder normal appearance. Pancreas: Normal appearance Spleen: Normal appearance Adrenals/Urinary Tract: LEFT hydronephrosis and hydroureter despite LEFT ureteral stent. No evidence of renal mass or calcification. RIGHT kidney, RIGHT ureter, and bladder otherwise  normal appearance. Mild stranding of the prevesical fat noted, slightly improved. Adrenal glands normal appearance. Stomach/Bowel: Appendix surgically absent. Sigmoid colostomy LEFT mid abdomen. Stomach and remaining bowel loops unremarkable. Vascular/Lymphatic: Numerous normal sized retroperitoneal and mesenteric nodes. No definite adenopathy. Aorta normal caliber. Reproductive: Uterus and unremarkable. LEFT adnexal tissue planes remain ill defined similar to prior study. Difficult to exclude subtle focal fluid collections from un opacified bowel loops in pelvis. Musculoskeletal: Unremarkable Other: No free air or free fluid. IMPRESSION: Study decreased stranding of prevesical space  fat since prior study. Mild persistent LEFT hydronephrosis and hydroureter despite stenting. Numerous though normal sized mesenteric and retroperitoneal nodes, appears slightly more prominent than on previous exam. Dependent atelectasis LEFT lower lobe. Electronically Signed   By: Ulyses Southward M.D.   On: 06/19/2016 18:39   Ct Abdomen Pelvis W Contrast  Result Date: 06/09/2016 CLINICAL DATA:  Constant mid abdominal pain, onset this morning. Recent hospital discharge after abdominal procedures. EXAM: CT ABDOMEN AND PELVIS WITH CONTRAST TECHNIQUE: Multidetector CT imaging of the abdomen and pelvis was performed using the standard protocol following bolus administration of intravenous contrast. CONTRAST:  ISOVUE-300 IOPAMIDOL (ISOVUE-300) INJECTION 61% COMPARISON:  05/29/2016, 05/22/2016 FINDINGS: Lower chest:  No significant abnormality Hepatobiliary: There are normal appearances of the liver, gallbladder and bile ducts except for a small volume layering sludge within the gallbladder lumen. Pancreas: Normal Spleen: Normal Adrenals/Urinary Tract: Satisfactorily positioned left ureteral stent. 2 mm calculus of the lower pole right renal collecting system. No significant hydronephrosis. Unremarkable appearances of urinary bladder. Stomach/Bowel: Unremarkable appearances of the left lower quadrant colostomy. Generous colonic stool volume. There is abnormal dilatation of small bowel without caliber transition and without focal inflammatory change. The stomach is unremarkable. Vascular/Lymphatic: The abdominal aorta is normal in caliber. There is no atherosclerotic calcification. There is no adenopathy in the abdomen or pelvis. Reproductive: Rim enhancing tubular structure in the left adnexal region is unchanged or slightly reduced in size, measuring 1.0 x 3.5 cm and previously measuring 1.0 x 4.2 cm. Uterus and adnexal structures are otherwise unremarkable. Other: Minimal free pelvic fluid.  No extraluminal air.  Musculoskeletal: Low midline abdominal wound, without evidence of drainable collection. IMPRESSION: 1. New or worsened small bowel dilatation without transition point. No focal small bowel inflammation. This may be reactive. No evidence of a significant bowel obstruction. No perforation. 2. Unchanged or slightly decreased rim enhancing tubular structure in the left adnexal region. 3. No new or enlarging collections in the abdomen or pelvis. Small volume free fluid. 4. Satisfactorily positioned left ureteral stent. Right nephrolithiasis. Electronically Signed   By: Ellery Plunk M.D.   On: 06/09/2016 22:37   Ct Abdomen Pelvis W Contrast  Result Date: 05/29/2016 CLINICAL DATA:  Followup abdominal and pelvic abscess and left hydronephrosis. EXAM: CT ABDOMEN AND PELVIS WITH CONTRAST TECHNIQUE: Multidetector CT imaging of the abdomen and pelvis was performed using the standard protocol following bolus administration of intravenous contrast. CONTRAST:  ISOVUE-300 IOPAMIDOL (ISOVUE-300) INJECTION 61% COMPARISON:  05/22/2016 FINDINGS: Lower chest:  No acute findings. Hepatobiliary: No liver masses identified. Layering sludge again seen within the gallbladder, without evidence of cholecystitis or biliary dilatation. Pancreas: No mass, inflammatory changes, or other significant abnormality. Spleen: Within normal limits in size and appearance. Adrenals/Urinary Tract: There has been interval placement of a left ureteral stent in appropriate position. Resolution of left hydronephrosis since previous study. No evidence of renal masses. Normal adrenal glands. 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 No evidence of obstruction or  bowel wall thickening. Vascular/Lymphatic: No pathologically enlarged lymph nodes. No evidence of abdominal aortic aneurysm. Reproductive: Surgical drain again seen within the pelvis. Uterus is unremarkable. A small tubular shape rim enhancing fluid collection in the left  adnexa has mildly decreased in size, currently measuring 1.0 x 4.2 cm on image 56/series 603 compared with 1.5 x 4.8 cm previously. Adjacent inflammatory changes within the left adnexa and central pelvis show mild improvement. No new or enlarging fluid collections identified. Other: None. Musculoskeletal:  No suspicious bone lesions identified. IMPRESSION: Slight decrease in size of small tubular rim enhancing fluid collection in left adnexa and adjacent inflammatory changes, consistent with improving tubo-ovarian abscess. Resolution of left hydroureteronephrosis following left ureteral stent placement. Electronically Signed   By: Myles Rosenthal M.D.   On: 05/29/2016 09:43     Subjective: Pt without complaints.  Ready to go home.    Discharge Exam: Vitals:   06/25/16 2133 06/26/16 0511  BP: 95/67 (!) 100/57  Pulse: 83 65  Resp: 16 15  Temp: 98.6 F (37 C) 98.7 F (37.1 C)   Vitals:   06/25/16 0537 06/25/16 1500 06/25/16 2133 06/26/16 0511  BP: 100/63 103/67 95/67 (!) 100/57  Pulse: 62 76 83 65  Resp: 16 17 16 15   Temp: 98.2 F (36.8 C) 98.4 F (36.9 C) 98.6 F (37 C) 98.7 F (37.1 C)  TempSrc: Oral Oral Oral Oral  SpO2: 99% 99% 99% 99%  Weight:      Height:       General exam: Appears comfortable  HEENT: PERRLA, oral mucosa moist, no sclera icterus or thrush Respiratory system: Clear to auscultation. Respiratory effort normal. Cardiovascular system: S1 & S2 heard, RRR.  No murmurs  Gastrointestinal system: Abdomen soft, non-tender, nondistended. Normal bowel sound. No organomegaly Central nervous system: Alert and oriented. No focal neurological deficits. Extremities: No cyanosis, clubbing or edema Skin: improving macular rash over entire body - rash noted to be more purple brown now rather than bright red and improving. Psychiatry:  Flat affect.      The results of significant diagnostics from this hospitalization (including imaging, microbiology, ancillary and laboratory)  are listed below for reference.     Microbiology: Recent Results (from the past 240 hour(s))  C difficile quick scan w PCR reflex     Status: None   Collection Time: 06/19/16  2:00 PM  Result Value Ref Range Status   C Diff antigen NEGATIVE NEGATIVE Final   C Diff toxin NEGATIVE NEGATIVE Final   C Diff interpretation No C. difficile detected.  Final  MRSA PCR Screening     Status: None   Collection Time: 06/19/16 10:39 PM  Result Value Ref Range Status   MRSA by PCR NEGATIVE NEGATIVE Final    Comment:        The GeneXpert MRSA Assay (FDA approved for NASAL specimens only), is one component of a comprehensive MRSA colonization surveillance program. It is not intended to diagnose MRSA infection nor to guide or monitor treatment for MRSA infections.      Labs: BNP (last 3 results) No results for input(s): BNP in the last 8760 hours. Basic Metabolic Panel:  Recent Labs Lab 06/19/16 2015 06/20/16 0349 06/21/16 1010 06/24/16 0445 06/26/16 0455  NA 134* 136 139 138  --   K 3.9 3.5 3.6 3.7  --   CL 102 107 111 105  --   CO2 23 22 23 28   --   GLUCOSE 114* 135* 156* 148*  --  BUN 9 9 8 16   --   CREATININE 0.71 0.58 0.61 0.54 0.67  CALCIUM 7.9* 7.7* 7.9* 8.0*  --   MG 2.1  --   --   --   --   PHOS 2.4*  --   --   --   --    Liver Function Tests:  Recent Labs Lab 06/20/16 0349  AST 16  ALT 12*  ALKPHOS 66  BILITOT 0.2*  PROT 5.8*  ALBUMIN 2.2*   No results for input(s): LIPASE, AMYLASE in the last 168 hours. No results for input(s): AMMONIA in the last 168 hours. CBC:  Recent Labs Lab 06/20/16 0349 06/21/16 1010 06/24/16 0445  WBC 16.4* 28.1* 29.7*  NEUTROABS 10.5*  --   --   HGB 9.9* 10.4* 9.5*  HCT 28.9* 31.3* 27.5*  MCV 85.5 85.5 83.6  PLT 319 386 324   Cardiac Enzymes: No results for input(s): CKTOTAL, CKMB, CKMBINDEX, TROPONINI in the last 168 hours. BNP: Invalid input(s): POCBNP CBG: No results for input(s): GLUCAP in the last 168  hours. D-Dimer No results for input(s): DDIMER in the last 72 hours. Hgb A1c No results for input(s): HGBA1C in the last 72 hours. Lipid Profile No results for input(s): CHOL, HDL, LDLCALC, TRIG, CHOLHDL, LDLDIRECT in the last 72 hours. Thyroid function studies No results for input(s): TSH, T4TOTAL, T3FREE, THYROIDAB in the last 72 hours.  Invalid input(s): FREET3 Anemia work up No results for input(s): VITAMINB12, FOLATE, FERRITIN, TIBC, IRON, RETICCTPCT in the last 72 hours. Urinalysis    Component Value Date/Time   COLORURINE GREEN (A) 06/23/2016 0826   APPEARANCEUR CLOUDY (A) 06/23/2016 0826   LABSPEC 1.023 06/23/2016 0826   PHURINE 6.5 06/23/2016 0826   GLUCOSEU NEGATIVE 06/23/2016 0826   HGBUR LARGE (A) 06/23/2016 0826   BILIRUBINUR NEGATIVE 06/23/2016 0826   KETONESUR NEGATIVE 06/23/2016 0826   PROTEINUR NEGATIVE 06/23/2016 0826   UROBILINOGEN 0.2 05/01/2015 1622   NITRITE NEGATIVE 06/23/2016 0826   LEUKOCYTESUR NEGATIVE 06/23/2016 0826   Sepsis Labs Invalid input(s): PROCALCITONIN,  WBC,  LACTICIDVEN Microbiology Recent Results (from the past 240 hour(s))  C difficile quick scan w PCR reflex     Status: None   Collection Time: 06/19/16  2:00 PM  Result Value Ref Range Status   C Diff antigen NEGATIVE NEGATIVE Final   C Diff toxin NEGATIVE NEGATIVE Final   C Diff interpretation No C. difficile detected.  Final  MRSA PCR Screening     Status: None   Collection Time: 06/19/16 10:39 PM  Result Value Ref Range Status   MRSA by PCR NEGATIVE NEGATIVE Final    Comment:        The GeneXpert MRSA Assay (FDA approved for NASAL specimens only), is one component of a comprehensive MRSA colonization surveillance program. It is not intended to diagnose MRSA infection nor to guide or monitor treatment for MRSA infections.    Time coordinating discharge: 34 minutes  SIGNED: Standley Dakinslanford Lino Wickliff, MD  [facility] 06/26/2016, 2:25 PM Pager   If 7PM-7AM, please  contact night-coverage [email or web address] Password [email or web address]

## 2016-06-26 NOTE — Progress Notes (Signed)
Discharge instructions discussed with patient, verbalized agreement and understanding, prescription given to patient 

## 2016-07-16 ENCOUNTER — Other Ambulatory Visit: Payer: Self-pay | Admitting: Obstetrics

## 2016-07-31 ENCOUNTER — Other Ambulatory Visit: Payer: Self-pay | Admitting: General Surgery

## 2016-07-31 DIAGNOSIS — N739 Female pelvic inflammatory disease, unspecified: Secondary | ICD-10-CM

## 2016-08-05 ENCOUNTER — Ambulatory Visit
Admission: RE | Admit: 2016-08-05 | Discharge: 2016-08-05 | Disposition: A | Discharge status: Home / Self Care | Payer: Medicaid Other | Source: Ambulatory Visit | Attending: General Surgery | Admitting: General Surgery

## 2016-08-05 DIAGNOSIS — N739 Female pelvic inflammatory disease, unspecified: Secondary | ICD-10-CM

## 2016-08-05 MED ORDER — IOPAMIDOL (ISOVUE-300) INJECTION 61%
100.0000 mL | Freq: Once | INTRAVENOUS | Status: AC | PRN
  Administered 2016-08-05: 100 mL via INTRAVENOUS

## 2016-08-12 ENCOUNTER — Other Ambulatory Visit (HOSPITAL_COMMUNITY): Payer: Self-pay | Admitting: General Surgery

## 2016-08-12 DIAGNOSIS — N739 Female pelvic inflammatory disease, unspecified: Secondary | ICD-10-CM

## 2016-08-18 ENCOUNTER — Encounter (HOSPITAL_COMMUNITY): Payer: Self-pay | Admitting: Radiology

## 2016-08-18 ENCOUNTER — Ambulatory Visit (HOSPITAL_COMMUNITY)
Admission: RE | Admit: 2016-08-18 | Discharge: 2016-08-18 | Disposition: A | Discharge status: Home / Self Care | Payer: Medicaid Other | Source: Ambulatory Visit | Attending: General Surgery | Admitting: General Surgery

## 2016-08-18 DIAGNOSIS — N739 Female pelvic inflammatory disease, unspecified: Secondary | ICD-10-CM | POA: Diagnosis present

## 2016-08-18 DIAGNOSIS — Z933 Colostomy status: Secondary | ICD-10-CM | POA: Diagnosis not present

## 2016-08-18 MED ORDER — IOPAMIDOL (ISOVUE-300) INJECTION 61%
450.0000 mL | Freq: Once | INTRAVENOUS | Status: AC | PRN
  Administered 2016-08-18: 150 mL

## 2016-08-18 MED ORDER — IOPAMIDOL (ISOVUE-300) INJECTION 61%
INTRAVENOUS | Status: AC
  Administered 2016-08-18: 150 mL
  Filled 2016-08-18: qty 450

## 2016-08-20 ENCOUNTER — Ambulatory Visit: Payer: Self-pay | Admitting: General Surgery

## 2016-08-20 NOTE — H&P (Signed)
Sheila Willis 08/20/2016 10:43 AM Location: [facility] Patient #: [id number] DOB: 20-Apr-1986 Single / Language: [place] / Race: Undefined Female   History of Present Illness Adolph Pollack(Nyaisha Simao J. Joselinne Lawal MD; 08/20/2016 11:04 AM) The patient is a 30 year old female.  Note:She presents today for a preoperative visit for colostomy closure. I have reviewed the barium enema and it looks good. She is feeling good and is eager to proceed with a colostomy closure. She also has a left ureteral stent placed by Dr. Berneice HeinrichManny. We will need to coordinate the procedure with him to see if he can remove the stent.  Allergies (Sonya Bynum, CMA; 08/20/2016 10:43 AM) Latex Exam Gloves *MEDICAL DEVICES AND SUPPLIES* Cefotetan and Dextrose *CEPHALOSPORINS*  Medication History (Sonya Bynum, CMA; 08/20/2016 10:43 AM) OxyCODONE HCl (10MG  Tablet, Oral as needed) Active. Heparin (Porcine) in NaCl (15000-0.9UT/500ML-% Solution, Intravenous) Active. Ibuprofen (400MG  Tablet, Oral as needed) Active. Tylenol (325MG  Tablet, Oral as needed) Active. Medications Reconciled  Vitals (Sonya Bynum CMA; 08/20/2016 10:43 AM) 08/20/2016 10:43 AM Weight: 132 lb Height: 62in Body Surface Area: 1.6 m Body Mass Index: 24.14 kg/m  Pulse: 76 (Regular)  BP: 122/74 (Sitting, Left Arm, Standard)       Physical Exam Adolph Pollack(Raheel Kunkle J. Gabriell Daigneault MD; 08/20/2016 11:06 AM) The physical exam findings are as follows: Note:General: WDWN in NAD. Pleasant and cooperative.  HEENT: [place]/AT, no external nasal or ear masses, mucous membranes are moist  EYES: no scleral icterus  CV: RRR, no murmur, no edema  CHEST/RESP: Breath sounds equal and clear. Respirations nonlabored.  ABDOMEN: Soft, nontender, nondistended, no masses, lower midline scar, left sided colostomy is pink.  MUSCULOSKELETAL: FROM, good muscle tone, no edema, no venous stasis changes  NEUROLOGIC: Alert and oriented, answers questions appropriately, normal  gait and station.  PSYCHIATRIC: Normal mood, affect , and behavior.    Assessment & Plan Adolph Pollack(Jewel Venditto J. Alden Bensinger MD; 08/20/2016 11:09 AM) STATUS POST HARTMANN PROCEDURE (Z93.3) Impression: Barium enema looks good. I feel she is healthy enough to have colostomy closure. We'll need to coordinate this with Dr. Berneice HeinrichManny to see if he can get the left ureteral stent out  Plan: Laparoscopic possible open colostomy closure. I have explained the procedure and risks. Risks include but are not limited to bleeding, infection, wound problems, anesthesia, anastomotic leak, need for another colostomy, need for reoperative surgery, injury to intraabominal organs (such as intestine, spleen, kidney, bladder, ureter, etc.), ileus, irregular bowel habits. He seems to understand and agrees to proceed. We will give her bowel prep instructions. We discussed the difficulty with pain management during her last hospital stay. I told her we would try to control her pain not only with narcotics with alternate medications so as to decrease the risk of delayed bowel function after surgery.  Avel Peaceodd Qusai Kem, M.D.

## 2016-08-21 ENCOUNTER — Ambulatory Visit (HOSPITAL_COMMUNITY): Payer: Medicaid Other

## 2016-11-10 ENCOUNTER — Other Ambulatory Visit: Payer: Self-pay | Admitting: Urology

## 2016-12-08 ENCOUNTER — Other Ambulatory Visit (HOSPITAL_COMMUNITY): Payer: Self-pay | Admitting: Nurse Practitioner

## 2016-12-08 DIAGNOSIS — N12 Tubulo-interstitial nephritis, not specified as acute or chronic: Secondary | ICD-10-CM

## 2016-12-08 NOTE — Progress Notes (Signed)
Need orders for 12-12-16 surgery in epic asap thanks

## 2016-12-11 ENCOUNTER — Ambulatory Visit: Payer: Self-pay | Admitting: General Surgery

## 2016-12-12 ENCOUNTER — Inpatient Hospital Stay (HOSPITAL_COMMUNITY): Admission: RE | Admit: 2016-12-12 | Payer: Medicaid Other | Source: Ambulatory Visit | Admitting: General Surgery

## 2016-12-12 ENCOUNTER — Encounter (HOSPITAL_COMMUNITY): Admission: RE | Payer: Self-pay | Source: Ambulatory Visit

## 2016-12-12 SURGERY — CLOSURE, COLOSTOMY, LAPAROSCOPIC
Anesthesia: General

## 2016-12-22 ENCOUNTER — Ambulatory Visit (HOSPITAL_COMMUNITY): Payer: Medicaid Other

## 2017-01-06 ENCOUNTER — Ambulatory Visit: Payer: Self-pay | Admitting: General Surgery

## 2017-01-06 HISTORY — PX: CYSTOSCOPY W/ URETERAL STENT REMOVAL: SHX1430

## 2017-01-07 NOTE — Patient Instructions (Addendum)
Sheila Willis  01/07/2017   Your procedure is scheduled on: Friday 01-16-17  Report to [facility]  Entrance [place]  elevators to 3rd floor to  Short Stay Center at 0800.am  Call this number if you have problems the morning of surgery [phone]   Remember: ONLY 1 PERSON MAY GO WITH YOU TO SHORT STAY TO GET  READY MORNING OF YOUR SURGERY.  Do not eat food or drink liquids :After Midnight.     Take these medicines the morning of surgery with A SIP OF WATER: none                                 You may not have any metal on your body including hair pins and              piercings  Do not wear jewelry, make-up, lotions, powders or perfumes, deodorant             Do not wear nail polish.  Do not shave  48 hours prior to surgery.     Do not bring valuables to the hospital. [facility] IS NOT             RESPONSIBLE   FOR VALUABLES.  Contacts, dentures or bridgework may not be worn into surgery.  Leave suitcase in the car. After surgery it may be brought to your room.         Special Instructions: N/A              Please read over the following fact sheets you were given: _____________________________________________________________________             [facility] - Preparing for [facility] Before surgery, you can play an important role.  Because skin is not sterile, your skin needs to be as free of germs as possible.  You can reduce the number of germs on your skin by washing with CHG (chlorahexidine gluconate) soap before surgery.  CHG is an antiseptic cleaner which kills germs and bonds with the skin to continue killing germs even after washing. Please DO NOT use if you have an allergy to CHG or antibacterial soaps.  If your skin becomes reddened/irritated stop using the CHG and inform your nurse when you arrive at Short Stay. Do not shave (including legs and underarms) for at least 48 hours prior to the first CHG shower.  You may shave your  face/neck. Please follow these instructions carefully:  1.  Shower with CHG Soap the night before surgery and the  morning of Surgery.  2.  If you choose to wash your hair, wash your hair first as usual with your  normal  shampoo.  3.  After you shampoo, rinse your hair and body thoroughly to remove the  shampoo.                           4.  Use CHG as you would any other liquid soap.  You can apply chg directly  to the skin and wash                       Gently with a scrungie or clean washcloth.  5.  Apply the CHG Soap to your body ONLY FROM THE NECK DOWN.  Do not use on face/ open                           Wound or open sores. Avoid contact with eyes, ears mouth and genitals (private parts).                       Wash face,  Genitals (private parts) with your normal soap.             6.  Wash thoroughly, paying special attention to the area where your surgery  will be performed.  7.  Thoroughly rinse your body with warm water from the neck down.  8.  DO NOT shower/wash with your normal soap after using and rinsing off  the CHG Soap.                9.  Pat yourself dry with a clean towel.            10.  Wear clean pajamas.            11.  Place clean sheets on your bed the night of your first shower and do not  sleep with pets. Day of Surgery : Do not apply any lotions/deodorants the morning of surgery.  Please wear clean clothes to the hospital/surgery center.  FAILURE TO FOLLOW THESE INSTRUCTIONS MAY RESULT IN THE CANCELLATION OF YOUR SURGERY PATIENT SIGNATURE_________________________________  NURSE SIGNATURE__________________________________  ________________________________________________________________________

## 2017-01-14 ENCOUNTER — Encounter (HOSPITAL_COMMUNITY)
Admission: RE | Admit: 2017-01-14 | Discharge: 2017-01-14 | Disposition: A | Discharge status: Home / Self Care | Payer: Medicaid Other | Source: Ambulatory Visit | Attending: General Surgery | Admitting: General Surgery

## 2017-01-14 ENCOUNTER — Encounter (HOSPITAL_COMMUNITY): Payer: Self-pay | Admitting: *Deleted

## 2017-01-14 DIAGNOSIS — Z0183 Encounter for blood typing: Secondary | ICD-10-CM | POA: Diagnosis not present

## 2017-01-14 DIAGNOSIS — Z01812 Encounter for preprocedural laboratory examination: Secondary | ICD-10-CM | POA: Diagnosis present

## 2017-01-14 DIAGNOSIS — Z933 Colostomy status: Secondary | ICD-10-CM | POA: Diagnosis not present

## 2017-01-14 HISTORY — DX: Gastro-esophageal reflux disease without esophagitis: K21.9

## 2017-01-14 HISTORY — DX: Anemia, unspecified: D64.9

## 2017-01-14 LAB — COMPREHENSIVE METABOLIC PANEL
ALK PHOS: 82 U/L (ref 38–126)
ALT: 6 U/L — ABNORMAL LOW (ref 14–54)
AST: 16 U/L (ref 15–41)
Albumin: 3.6 g/dL (ref 3.5–5.0)
Anion gap: 8 (ref 5–15)
BUN: 15 mg/dL (ref 6–20)
CALCIUM: 8.9 mg/dL (ref 8.9–10.3)
CO2: 24 mmol/L (ref 22–32)
CREATININE: 0.85 mg/dL (ref 0.44–1.00)
Chloride: 104 mmol/L (ref 101–111)
GFR calc non Af Amer: >60 mL/min (ref >60)
GLUCOSE: 93 mg/dL (ref 65–99)
Potassium: 4 mmol/L (ref 3.5–5.1)
SODIUM: 136 mmol/L (ref 135–145)
Total Bilirubin: 0.6 mg/dL (ref 0.3–1.2)
Total Protein: 6.7 g/dL (ref 6.5–8.1)

## 2017-01-14 LAB — CBC WITH DIFFERENTIAL/PLATELET
Basophils Absolute: 0 10*3/uL (ref 0.0–0.1)
Basophils Relative: 0 %
EOS ABS: 0.2 10*3/uL (ref 0.0–0.7)
Eosinophils Relative: 2 %
HCT: 39.7 % (ref 36.0–46.0)
HEMOGLOBIN: 13 g/dL (ref 12.0–15.0)
LYMPHS ABS: 2.4 10*3/uL (ref 0.7–4.0)
Lymphocytes Relative: 31 %
MCH: 30 pg (ref 26.0–34.0)
MCHC: 32.7 g/dL (ref 30.0–36.0)
MCV: 91.7 fL (ref 78.0–100.0)
MONOS PCT: 7 %
Monocytes Absolute: 0.5 10*3/uL (ref 0.1–1.0)
NEUTROS PCT: 60 %
Neutro Abs: 4.5 10*3/uL (ref 1.7–7.7)
Platelets: 248 10*3/uL (ref 150–400)
RBC: 4.33 MIL/uL (ref 3.87–5.11)
RDW: 13.8 % (ref 11.5–15.5)
WBC: 7.6 10*3/uL (ref 4.0–10.5)

## 2017-01-14 LAB — HCG, SERUM, QUALITATIVE: Preg, Serum: NEGATIVE

## 2017-01-14 NOTE — Progress Notes (Signed)
EKG- 06/20/2016-epic  CXR- 06/19/16- epic

## 2017-01-14 NOTE — Progress Notes (Signed)
Did not call pharmacy to complete medication while here on preop appointment.  Reviewed medication record with patient and updated in epic.  Called pharmacy after patient had left and told them I had forgotten to call them  Gave them patient's phone number to call and I also called patient and left her a message to call pharmacy at [phone] to update medication record with pharmacy.

## 2017-01-15 MED ORDER — GENTAMICIN SULFATE 40 MG/ML IJ SOLN
280.0000 mg | Freq: Once | INTRAVENOUS | Status: AC
  Administered 2017-01-16: 280 mg via INTRAVENOUS
  Filled 2017-01-15 (×2): qty 7

## 2017-01-16 ENCOUNTER — Inpatient Hospital Stay (HOSPITAL_COMMUNITY): Payer: Medicaid Other | Admitting: Certified Registered Nurse Anesthetist

## 2017-01-16 ENCOUNTER — Encounter (HOSPITAL_COMMUNITY): Payer: Self-pay | Admitting: *Deleted

## 2017-01-16 ENCOUNTER — Encounter (HOSPITAL_COMMUNITY)
Admission: RE | Disposition: A | Discharge status: Home Health | Payer: Self-pay | Source: Ambulatory Visit | Attending: General Surgery

## 2017-01-16 ENCOUNTER — Inpatient Hospital Stay (HOSPITAL_COMMUNITY)
Admission: RE | Admit: 2017-01-16 | Discharge: 2017-01-21 | DRG: 336 | Disposition: A | Discharge status: Home Health | Payer: Medicaid Other | Source: Ambulatory Visit | Attending: General Surgery | Admitting: General Surgery

## 2017-01-16 DIAGNOSIS — Z87891 Personal history of nicotine dependence: Secondary | ICD-10-CM

## 2017-01-16 DIAGNOSIS — K660 Peritoneal adhesions (postprocedural) (postinfection): Secondary | ICD-10-CM | POA: Diagnosis not present

## 2017-01-16 DIAGNOSIS — F329 Major depressive disorder, single episode, unspecified: Secondary | ICD-10-CM | POA: Diagnosis present

## 2017-01-16 DIAGNOSIS — Z79899 Other long term (current) drug therapy: Secondary | ICD-10-CM

## 2017-01-16 DIAGNOSIS — K219 Gastro-esophageal reflux disease without esophagitis: Secondary | ICD-10-CM | POA: Diagnosis not present

## 2017-01-16 DIAGNOSIS — Z933 Colostomy status: Secondary | ICD-10-CM

## 2017-01-16 DIAGNOSIS — F419 Anxiety disorder, unspecified: Secondary | ICD-10-CM | POA: Diagnosis not present

## 2017-01-16 DIAGNOSIS — Z433 Encounter for attention to colostomy: Principal | ICD-10-CM

## 2017-01-16 DIAGNOSIS — K567 Ileus, unspecified: Secondary | ICD-10-CM | POA: Diagnosis not present

## 2017-01-16 HISTORY — PX: COLOSTOMY TAKEDOWN: SHX5258

## 2017-01-16 LAB — TYPE AND SCREEN
ABO/RH(D): B POS
Antibody Screen: NEGATIVE

## 2017-01-16 SURGERY — CLOSURE, COLOSTOMY, LAPAROSCOPIC
Anesthesia: General | Site: Abdomen

## 2017-01-16 MED ORDER — PROPOFOL 10 MG/ML IV BOLUS
INTRAVENOUS | Status: DC | PRN
  Administered 2017-01-16: 50 mg via INTRAVENOUS
  Administered 2017-01-16 (×2): 20 mg via INTRAVENOUS
  Administered 2017-01-16: 150 mg via INTRAVENOUS

## 2017-01-16 MED ORDER — ESMOLOL HCL 100 MG/10ML IV SOLN
INTRAVENOUS | Status: DC | PRN
  Administered 2017-01-16: 20 mg via INTRAVENOUS
  Administered 2017-01-16: 10 mg via INTRAVENOUS

## 2017-01-16 MED ORDER — LACTATED RINGERS IV SOLN: INTRAVENOUS | Status: DC

## 2017-01-16 MED ORDER — ONDANSETRON HCL 4 MG/2ML IJ SOLN
4.0000 mg | INTRAMUSCULAR | Status: DC | PRN
  Administered 2017-01-18 – 2017-01-20 (×4): 4 mg via INTRAVENOUS
  Filled 2017-01-16 (×2): qty 2

## 2017-01-16 MED ORDER — MEPERIDINE HCL 50 MG/ML IJ SOLN: 6.2500 mg | INTRAMUSCULAR | Status: DC | PRN

## 2017-01-16 MED ORDER — LIDOCAINE 2% (20 MG/ML) 5 ML SYRINGE
INTRAMUSCULAR | Status: DC | PRN
  Administered 2017-01-16: 1.5 mg/kg/h via INTRAVENOUS

## 2017-01-16 MED ORDER — LIDOCAINE 2% (20 MG/ML) 5 ML SYRINGE
INTRAMUSCULAR | Status: AC
  Filled 2017-01-16: qty 10

## 2017-01-16 MED ORDER — ROCURONIUM BROMIDE 50 MG/5ML IV SOSY
PREFILLED_SYRINGE | INTRAVENOUS | Status: DC | PRN
  Administered 2017-01-16: 20 mg via INTRAVENOUS
  Administered 2017-01-16: 50 mg via INTRAVENOUS
  Administered 2017-01-16: 20 mg via INTRAVENOUS

## 2017-01-16 MED ORDER — MIDAZOLAM HCL 5 MG/5ML IJ SOLN
INTRAMUSCULAR | Status: DC | PRN
  Administered 2017-01-16: 2 mg via INTRAVENOUS

## 2017-01-16 MED ORDER — ACETAMINOPHEN 10 MG/ML IV SOLN
INTRAVENOUS | Status: AC
  Filled 2017-01-16: qty 100

## 2017-01-16 MED ORDER — LACTATED RINGERS IR SOLN
Status: DC | PRN
  Administered 2017-01-16: 1000 mL

## 2017-01-16 MED ORDER — ONDANSETRON HCL 4 MG/2ML IJ SOLN
INTRAMUSCULAR | Status: AC
  Filled 2017-01-16: qty 2

## 2017-01-16 MED ORDER — LIDOCAINE 2% (20 MG/ML) 5 ML SYRINGE
INTRAMUSCULAR | Status: AC
Start: 2017-01-16 — End: 2017-01-16
  Filled 2017-01-16: qty 5

## 2017-01-16 MED ORDER — MIDAZOLAM HCL 2 MG/2ML IJ SOLN
INTRAMUSCULAR | Status: AC
  Filled 2017-01-16: qty 2

## 2017-01-16 MED ORDER — KCL IN DEXTROSE-NACL 20-5-0.9 MEQ/L-%-% IV SOLN
INTRAVENOUS | Status: DC
  Administered 2017-01-16: 125 mL/h via INTRAVENOUS
  Administered 2017-01-16 – 2017-01-19 (×8): via INTRAVENOUS
  Administered 2017-01-20: 30 mL/h via INTRAVENOUS
  Administered 2017-01-20: 02:00:00 via INTRAVENOUS
  Filled 2017-01-16 (×13): qty 1000

## 2017-01-16 MED ORDER — PANTOPRAZOLE SODIUM 40 MG IV SOLR
40.0000 mg | INTRAVENOUS | Status: DC
  Administered 2017-01-16 – 2017-01-19 (×4): 40 mg via INTRAVENOUS
  Filled 2017-01-16 (×4): qty 40

## 2017-01-16 MED ORDER — HYDROMORPHONE 1 MG/ML IV SOLN
INTRAVENOUS | Status: DC
  Administered 2017-01-16: 1.6 mg via INTRAVENOUS
  Administered 2017-01-16: 1.2 mg via INTRAVENOUS
  Administered 2017-01-16: 14:00:00 via INTRAVENOUS
  Administered 2017-01-16: 2.2 mg via INTRAVENOUS
  Administered 2017-01-17: 1 mg via INTRAVENOUS
  Administered 2017-01-17: 0.2 mg via INTRAVENOUS
  Administered 2017-01-17: 2.4 mg via INTRAVENOUS
  Administered 2017-01-17: 4 mg via INTRAVENOUS
  Administered 2017-01-18: 2.4 mg via INTRAVENOUS
  Administered 2017-01-18: 3 mg via INTRAVENOUS
  Administered 2017-01-18: 06:00:00 via INTRAVENOUS
  Administered 2017-01-18: 2 mg via INTRAVENOUS
  Administered 2017-01-18: 1.8 mg via INTRAVENOUS
  Administered 2017-01-18: 3.6 mg via INTRAVENOUS
  Administered 2017-01-18: 3.4 mg via INTRAVENOUS
  Administered 2017-01-19: 6.2 mg via INTRAVENOUS
  Administered 2017-01-19: 2.8 mg via INTRAVENOUS
  Administered 2017-01-19: 1 mg via INTRAVENOUS
  Administered 2017-01-19: 3.2 mg via INTRAVENOUS
  Administered 2017-01-19: 2.2 mg via INTRAVENOUS
  Administered 2017-01-19: 2.6 mg via INTRAVENOUS
  Administered 2017-01-20: 5.6 mg via INTRAVENOUS
  Administered 2017-01-20: 3.5 mg via INTRAVENOUS
  Administered 2017-01-20: 3 mg via INTRAVENOUS
  Filled 2017-01-16 (×2): qty 25

## 2017-01-16 MED ORDER — SODIUM CHLORIDE 0.9% FLUSH: 9.0000 mL | INTRAVENOUS | Status: DC | PRN

## 2017-01-16 MED ORDER — DIPHENHYDRAMINE HCL 50 MG/ML IJ SOLN
INTRAMUSCULAR | Status: DC | PRN
  Administered 2017-01-16: 12.5 mg via INTRAVENOUS

## 2017-01-16 MED ORDER — FENTANYL CITRATE (PF) 100 MCG/2ML IJ SOLN
INTRAMUSCULAR | Status: DC | PRN
  Administered 2017-01-16 (×2): 50 ug via INTRAVENOUS
  Administered 2017-01-16: 100 ug via INTRAVENOUS
  Administered 2017-01-16 (×4): 50 ug via INTRAVENOUS

## 2017-01-16 MED ORDER — 0.9 % SODIUM CHLORIDE (POUR BTL) OPTIME
TOPICAL | Status: DC | PRN
  Administered 2017-01-16: 2000 mL

## 2017-01-16 MED ORDER — LIDOCAINE 2% (20 MG/ML) 5 ML SYRINGE
INTRAMUSCULAR | Status: DC | PRN
  Administered 2017-01-16: 50 mg via INTRAVENOUS

## 2017-01-16 MED ORDER — SCOPOLAMINE 1 MG/3DAYS TD PT72
MEDICATED_PATCH | TRANSDERMAL | Status: DC | PRN
  Administered 2017-01-16: 1 via TRANSDERMAL

## 2017-01-16 MED ORDER — DEXAMETHASONE SODIUM PHOSPHATE 4 MG/ML IJ SOLN
INTRAMUSCULAR | Status: DC | PRN
  Administered 2017-01-16: 10 mg via INTRAVENOUS

## 2017-01-16 MED ORDER — ESMOLOL HCL 100 MG/10ML IV SOLN
INTRAVENOUS | Status: AC
  Filled 2017-01-16: qty 10

## 2017-01-16 MED ORDER — BUPIVACAINE LIPOSOME 1.3 % IJ SUSP
20.0000 mL | Freq: Once | INTRAMUSCULAR | Status: AC
  Administered 2017-01-16: 16 mL
  Filled 2017-01-16: qty 20

## 2017-01-16 MED ORDER — CLINDAMYCIN PHOSPHATE 900 MG/50ML IV SOLN
900.0000 mg | Freq: Once | INTRAVENOUS | Status: AC
  Administered 2017-01-16: 900 mg via INTRAVENOUS
  Filled 2017-01-16: qty 50

## 2017-01-16 MED ORDER — METOCLOPRAMIDE HCL 5 MG/ML IJ SOLN
INTRAMUSCULAR | Status: AC
  Filled 2017-01-16: qty 2

## 2017-01-16 MED ORDER — HYDROMORPHONE HCL 1 MG/ML IJ SOLN
INTRAMUSCULAR | Status: AC
  Filled 2017-01-16: qty 1

## 2017-01-16 MED ORDER — HYDROMORPHONE 1 MG/ML IV SOLN
INTRAVENOUS | Status: AC
  Filled 2017-01-16: qty 25

## 2017-01-16 MED ORDER — DIPHENHYDRAMINE HCL 12.5 MG/5ML PO ELIX: 12.5000 mg | ORAL_SOLUTION | Freq: Four times a day (QID) | ORAL | Status: DC | PRN

## 2017-01-16 MED ORDER — BUPIVACAINE HCL (PF) 0.5 % IJ SOLN
INTRAMUSCULAR | Status: DC | PRN
  Administered 2017-01-16: 4 mL

## 2017-01-16 MED ORDER — ENOXAPARIN SODIUM 40 MG/0.4ML ~~LOC~~ SOLN
40.0000 mg | SUBCUTANEOUS | Status: DC
  Administered 2017-01-17 – 2017-01-20 (×4): 40 mg via SUBCUTANEOUS
  Filled 2017-01-16 (×4): qty 0.4

## 2017-01-16 MED ORDER — SCOPOLAMINE 1 MG/3DAYS TD PT72
MEDICATED_PATCH | TRANSDERMAL | Status: AC
  Filled 2017-01-16: qty 1

## 2017-01-16 MED ORDER — ONDANSETRON HCL 4 MG/2ML IJ SOLN
4.0000 mg | Freq: Four times a day (QID) | INTRAMUSCULAR | Status: DC | PRN
  Filled 2017-01-16 (×2): qty 2

## 2017-01-16 MED ORDER — DIPHENHYDRAMINE HCL 50 MG/ML IJ SOLN
INTRAMUSCULAR | Status: AC
  Filled 2017-01-16: qty 1

## 2017-01-16 MED ORDER — ACETAMINOPHEN 10 MG/ML IV SOLN
INTRAVENOUS | Status: DC | PRN
  Administered 2017-01-16: 1000 mg via INTRAVENOUS

## 2017-01-16 MED ORDER — PROMETHAZINE HCL 25 MG/ML IJ SOLN: 6.2500 mg | INTRAMUSCULAR | Status: DC | PRN

## 2017-01-16 MED ORDER — GLYCOPYRROLATE 0.2 MG/ML IV SOSY
PREFILLED_SYRINGE | INTRAVENOUS | Status: AC
  Filled 2017-01-16: qty 5

## 2017-01-16 MED ORDER — SODIUM CHLORIDE 0.9 % IJ SOLN
INTRAMUSCULAR | Status: AC
  Filled 2017-01-16: qty 50

## 2017-01-16 MED ORDER — METOCLOPRAMIDE HCL 5 MG/ML IJ SOLN
INTRAMUSCULAR | Status: DC | PRN
  Administered 2017-01-16: 10 mg via INTRAVENOUS

## 2017-01-16 MED ORDER — BUPIVACAINE HCL (PF) 0.5 % IJ SOLN
INTRAMUSCULAR | Status: AC
  Filled 2017-01-16: qty 30

## 2017-01-16 MED ORDER — FENTANYL CITRATE (PF) 250 MCG/5ML IJ SOLN
INTRAMUSCULAR | Status: AC
  Filled 2017-01-16: qty 5

## 2017-01-16 MED ORDER — DIPHENHYDRAMINE HCL 50 MG/ML IJ SOLN: 12.5000 mg | Freq: Four times a day (QID) | INTRAMUSCULAR | Status: DC | PRN

## 2017-01-16 MED ORDER — CLINDAMYCIN PHOSPHATE 900 MG/50ML IV SOLN
900.0000 mg | Freq: Three times a day (TID) | INTRAVENOUS | Status: AC
  Administered 2017-01-16: 900 mg via INTRAVENOUS
  Filled 2017-01-16: qty 50

## 2017-01-16 MED ORDER — ROCURONIUM BROMIDE 50 MG/5ML IV SOSY
PREFILLED_SYRINGE | INTRAVENOUS | Status: AC
  Filled 2017-01-16: qty 5

## 2017-01-16 MED ORDER — PROPOFOL 10 MG/ML IV BOLUS
INTRAVENOUS | Status: AC
  Filled 2017-01-16: qty 20

## 2017-01-16 MED ORDER — ALVIMOPAN 12 MG PO CAPS
12.0000 mg | ORAL_CAPSULE | Freq: Two times a day (BID) | ORAL | Status: DC
  Administered 2017-01-17 – 2017-01-21 (×9): 12 mg via ORAL
  Filled 2017-01-16 (×9): qty 1

## 2017-01-16 MED ORDER — HYDROMORPHONE HCL 1 MG/ML IJ SOLN
0.2500 mg | INTRAMUSCULAR | Status: DC | PRN
  Administered 2017-01-16 (×2): 0.5 mg via INTRAVENOUS

## 2017-01-16 MED ORDER — ONDANSETRON HCL 4 MG/2ML IJ SOLN
INTRAMUSCULAR | Status: DC | PRN
  Administered 2017-01-16: 4 mg via INTRAVENOUS

## 2017-01-16 MED ORDER — DEXAMETHASONE SODIUM PHOSPHATE 10 MG/ML IJ SOLN
INTRAMUSCULAR | Status: AC
  Filled 2017-01-16: qty 1

## 2017-01-16 MED ORDER — KETAMINE HCL 10 MG/ML IJ SOLN
INTRAMUSCULAR | Status: DC | PRN
  Administered 2017-01-16: 5 mg via INTRAVENOUS
  Administered 2017-01-16: 10 mg via INTRAVENOUS
  Administered 2017-01-16: 5 mg via INTRAVENOUS
  Administered 2017-01-16: 10 mg via INTRAVENOUS
  Administered 2017-01-16: 30 mg via INTRAVENOUS

## 2017-01-16 MED ORDER — HYDROMORPHONE HCL 1 MG/ML IJ SOLN
INTRAMUSCULAR | Status: DC | PRN
  Administered 2017-01-16: 1 mg via INTRAVENOUS
  Administered 2017-01-16 (×2): 0.5 mg via INTRAVENOUS

## 2017-01-16 MED ORDER — ONDANSETRON HCL 4 MG PO TABS: 4.0000 mg | ORAL_TABLET | Freq: Four times a day (QID) | ORAL | Status: DC | PRN

## 2017-01-16 MED ORDER — FENTANYL CITRATE (PF) 250 MCG/5ML IJ SOLN
INTRAMUSCULAR | Status: AC
Start: 2017-01-16 — End: 2017-01-16
  Filled 2017-01-16: qty 5

## 2017-01-16 MED ORDER — HYDROMORPHONE HCL 2 MG/ML IJ SOLN
INTRAMUSCULAR | Status: AC
  Filled 2017-01-16: qty 1

## 2017-01-16 MED ORDER — LACTATED RINGERS IV SOLN
INTRAVENOUS | Status: DC
  Administered 2017-01-16 (×2): via INTRAVENOUS

## 2017-01-16 MED ORDER — ALVIMOPAN 12 MG PO CAPS
12.0000 mg | ORAL_CAPSULE | Freq: Once | ORAL | Status: AC
  Administered 2017-01-16: 12 mg via ORAL
  Filled 2017-01-16: qty 1

## 2017-01-16 MED ORDER — SUGAMMADEX SODIUM 200 MG/2ML IV SOLN
INTRAVENOUS | Status: DC | PRN
  Administered 2017-01-16: 200 mg via INTRAVENOUS

## 2017-01-16 MED ORDER — NALOXONE HCL 0.4 MG/ML IJ SOLN: 0.4000 mg | INTRAMUSCULAR | Status: DC | PRN

## 2017-01-16 SURGICAL SUPPLY — 64 items
APPLIER CLIP 5 13 M/L LIGAMAX5 (MISCELLANEOUS)
APPLIER CLIP ROT 10 11.4 M/L (STAPLE)
BLADE EXTENDED COATED 6.5IN (ELECTRODE) ×3 IMPLANT
BLADE HEX COATED 2.75 (ELECTRODE) ×3 IMPLANT
CABLE HIGH FREQUENCY MONO STRZ (ELECTRODE) ×3 IMPLANT
CHLORAPREP W/TINT 26ML (MISCELLANEOUS) ×3 IMPLANT
CLIP APPLIE 5 13 M/L LIGAMAX5 (MISCELLANEOUS) IMPLANT
CLIP APPLIE ROT 10 11.4 M/L (STAPLE) IMPLANT
COUNTER NEEDLE 20 DBL MAG RED (NEEDLE) ×3 IMPLANT
COVER MAYO STAND STRL (DRAPES) ×9 IMPLANT
DECANTER SPIKE VIAL GLASS SM (MISCELLANEOUS) ×3 IMPLANT
DISSECTOR BLUNT TIP ENDO 5MM (MISCELLANEOUS) ×3 IMPLANT
DRAPE LAPAROSCOPIC ABDOMINAL (DRAPES) ×3 IMPLANT
DRAPE SURG IRRIG POUCH 19X23 (DRAPES) ×3 IMPLANT
DRSG OPSITE POSTOP 4X6 (GAUZE/BANDAGES/DRESSINGS) ×3 IMPLANT
ELECT REM PT RETURN 15FT ADLT (MISCELLANEOUS) ×3 IMPLANT
GAUZE SPONGE 2X2 8PLY STRL LF (GAUZE/BANDAGES/DRESSINGS) ×1 IMPLANT
GAUZE SPONGE 4X4 12PLY STRL (GAUZE/BANDAGES/DRESSINGS) ×3 IMPLANT
GLOVE ECLIPSE 8.0 STRL XLNG CF (GLOVE) ×6 IMPLANT
GLOVE INDICATOR 8.0 STRL GRN (GLOVE) ×6 IMPLANT
GOWN STRL REUS W/TWL LRG LVL3 (GOWN DISPOSABLE) ×6 IMPLANT
GOWN STRL REUS W/TWL XL LVL3 (GOWN DISPOSABLE) ×18 IMPLANT
HOLDER FOLEY CATH W/STRAP (MISCELLANEOUS) ×3 IMPLANT
IRRIG SUCT STRYKERFLOW 2 WTIP (MISCELLANEOUS) ×3
IRRIGATION SUCT STRKRFLW 2 WTP (MISCELLANEOUS) ×1 IMPLANT
LEGGING LITHOTOMY PAIR STRL (DRAPES) ×3 IMPLANT
LIGASURE IMPACT 36 18CM CVD LR (INSTRUMENTS) IMPLANT
PACK COLON (CUSTOM PROCEDURE TRAY) ×3 IMPLANT
PAD POSITIONING PINK XL (MISCELLANEOUS) ×3 IMPLANT
SCISSORS LAP 5X35 DISP (ENDOMECHANICALS) ×3 IMPLANT
SEALER TISSUE X1 CVD JAW (INSTRUMENTS) IMPLANT
SHEARS HARMONIC ACE PLUS 36CM (ENDOMECHANICALS) ×3 IMPLANT
SLEEVE XCEL OPT CAN 5 100 (ENDOMECHANICALS) ×9 IMPLANT
SOL PREP POV-IOD 16OZ 10% (MISCELLANEOUS) ×3 IMPLANT
SOL PREP PROV IODINE SCRUB 4OZ (MISCELLANEOUS) ×3 IMPLANT
SPONGE GAUZE 2X2 STER 10/PKG (GAUZE/BANDAGES/DRESSINGS) ×2
STAPLER GUN LINEAR PROX 60 (STAPLE) ×3 IMPLANT
STAPLER PROXIMATE 75MM BLUE (STAPLE) ×3 IMPLANT
STAPLER VISISTAT 35W (STAPLE) ×6 IMPLANT
SUT ETHILON 3 0 PS 1 (SUTURE) IMPLANT
SUT MNCRL AB 4-0 PS2 18 (SUTURE) ×3 IMPLANT
SUT PDS AB 1 CTX 36 (SUTURE) ×9 IMPLANT
SUT PDS AB 1 TP1 96 (SUTURE) IMPLANT
SUT PROLENE 2 0 SH DA (SUTURE) ×3 IMPLANT
SUT SILK 2 0 (SUTURE) ×2
SUT SILK 2 0 SH CR/8 (SUTURE) ×3 IMPLANT
SUT SILK 2-0 18XBRD TIE 12 (SUTURE) ×1 IMPLANT
SUT SILK 3 0 (SUTURE) ×2
SUT SILK 3 0 SH CR/8 (SUTURE) ×3 IMPLANT
SUT SILK 3-0 18XBRD TIE 12 (SUTURE) ×1 IMPLANT
SUT VICRYL 2 0 18  UND BR (SUTURE) ×2
SUT VICRYL 2 0 18 UND BR (SUTURE) ×1 IMPLANT
SYS LAPSCP GELPORT 120MM (MISCELLANEOUS) ×3
SYSTEM LAPSCP GELPORT 120MM (MISCELLANEOUS) ×1 IMPLANT
TAPE CLOTH 4X10 WHT NS (GAUZE/BANDAGES/DRESSINGS) ×3 IMPLANT
TAPE CLOTH SURG 4X10 WHT LF (GAUZE/BANDAGES/DRESSINGS) ×3 IMPLANT
TOWEL OR 17X26 10 PK STRL BLUE (TOWEL DISPOSABLE) ×3 IMPLANT
TOWEL OR NON WOVEN STRL DISP B (DISPOSABLE) ×3 IMPLANT
TRAY FOLEY BAG SILVER LF 14FR (CATHETERS) ×3 IMPLANT
TRAY FOLEY BAG SILVER LF 16FR (SET/KITS/TRAYS/PACK) ×3 IMPLANT
TROCAR BLADELESS OPT 5 100 (ENDOMECHANICALS) ×3 IMPLANT
TROCAR XCEL BLUNT TIP 100MML (ENDOMECHANICALS) ×3 IMPLANT
TROCAR XCEL NON-BLD 11X100MML (ENDOMECHANICALS) IMPLANT
TUBING INSUF HEATED (TUBING) ×3 IMPLANT

## 2017-01-16 NOTE — Anesthesia Procedure Notes (Signed)
Procedure Name: Intubation Date/Time: 01/16/2017 10:03 AM Performed by: Deliah Boston Pre-anesthesia Checklist: Patient identified, Emergency Drugs available, Suction available and Patient being monitored Patient Re-evaluated:Patient Re-evaluated prior to inductionOxygen Delivery Method: Circle system utilized Preoxygenation: Pre-oxygenation with 100% oxygen Intubation Type: IV induction Ventilation: Mask ventilation without difficulty Laryngoscope Size: Mac and 3 Grade View: Grade I Tube type: Oral Tube size: 7.0 mm Number of attempts: 1 Airway Equipment and Method: Stylet and Oral airway Placement Confirmation: ETT inserted through vocal cords under direct vision,  positive ETCO2 and breath sounds checked- equal and bilateral Secured at: 22 cm Tube secured with: Tape Dental Injury: Teeth and Oropharynx as per pre-operative assessment

## 2017-01-16 NOTE — Op Note (Signed)
Operative Note  Sheila Willis female 31 y.o. 01/16/2017  PREOPERATIVE DX:  Colostomy in place  POSTOPERATIVE DX:  Same  PROCEDURE:   Laparoscopic assisted colostomy closure with laparoscopic lysis of adhesions (2 hours).         Surgeon: Adolph Pollack   Assistants: Dr. Darnell Level M.D.  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 She now presents for elective laparoscopic assisted colostomy closure.    Procedure Detail:  She was brought to the operating room placed supine on the operating table in the general anesthetic was given. A Foley catheter was inserted. She was placed in lithotomy position. An oral gastric tube was inserted. The abdominal wall and perineal areas were widely sterilely prepped and draped. A timeout was performed.  A 5 mm incision was made in the right upper quadrant after she was placed in slight reverse Trendelenburg position. Using a 5 mm Optiview trocar and laparoscope, access was gained into the peritoneal cavity and pneumoperitoneum created by insufflation of carbon dioxide gas.  Inspection of the area under the trocar demonstrated no evidence of bleeding or organ injury. Multiple adhesions between the omentum and abdominal wall were noted. A 5 mm trocar was placed in the right mid abdomen. Using sharp and blunt dissection began performing adhesio lysis and cleared the midline of the adhesions. I then placed a 5 mm trocar in the right lower quadrant. The colostomy site was identified and I took down adhesions sharply and bluntly around it. I then identified the sigmoid colon stump that had been anchored to the abdominal wall. There were adhesions of small bowel to this which I mobilized and freed up. I then freed up adhesions between the stump and the abdominal wall. There were omental adhesions to left upper quadrant which were freed up using the Harmonic scalpel. I then  mobilized some of the splenic flexure. In total, adhesiolysis took approximately 2 hours.   Next, the colostomy was approached and a circular incision was made around the colostomy. The colostomy was mobilized and freed up from its attachments and dropped into the abdominal cavity. A side to side colon to colon anastomosis was planned. However, this could not be made through the colostomy incision. Thus I did a limited lower midline incision through the previous scar. I then approximated the proximal and distal colonic segments in a side to side fashion. A side to side stapled anastomosis was then performed with a linear cutting stapler. There was posterior staple line bleeding internally which was controlled with interrupted 3-0 silk sutures. Hemostasis was then adequate. The common defect was closed with a linear noncutting stapler. which also resected the colostomy. A crotch stitch of 3-0 silk was then placed. The anastomosis was patent, viable, under no tension, and had no evidence of leak. The anastomosis was placed back into the abdominal cavity.  The abdominal cavity was then irrigated with saline solution. Thorough inspection of all quadrants and centrally was performed. There is no evidence of bleeding or organ injury. Gloves and gowns drapes and instruments were then all changed.  The colostomy site fascia was then closed with running #1 PDS suture. Further irrigation was performed through the lower midline incision. A lower midline incision fascia was then closed with running #1 PDS suture. The subcutaneous tissue of this incision was irrigated and Exparel was injected into the fascia. The skin was closed with staples. Exparel was injected into the colostomy site  fascia and it was packed with saline moistened gauze. The 3 trocar sites skin incisions were closed with 4-0 Monocryl subcuticular stitches. Dressings were placed over all wounds.  She tolerated the procedure well without any apparent  complications. She was taken to the recovery room in satisfactory condition.  Estimated Blood Loss:  300 mL         Specimens: none        Complications:  * No complications entered in OR log *         Disposition: PACU - hemodynamically stable.         Condition: stable

## 2017-01-16 NOTE — Interval H&P Note (Signed)
History and Physical Interval Note:  01/16/2017 9:44 AM  Sheila Willis  has presented today for surgery, with the diagnosis of Colostomy in place  The various methods of treatment have been discussed with the patient and family. After consideration of risks, benefits and other options for treatment, the patient has consented to  Procedure(s): LAPAROSCOPIC ASSISTED COLOSTOMY CLOSURE (N/A) as a surgical intervention .  The patient's history has been reviewed, patient examined, no change in status, stable for surgery.  I have reviewed the patient's chart and labs.  Questions were answered to the patient's satisfaction.     Sheila Willis Shela Commons

## 2017-01-16 NOTE — Anesthesia Postprocedure Evaluation (Addendum)
Anesthesia Post Note  Patient: Sheila Willis  Procedure(s) Performed: Procedure(s) (LRB): LAPAROSCOPIC ASSISTED COLOSTOMY CLOSURE (N/A)  Patient location during evaluation: PACU Anesthesia Type: General Level of consciousness: awake and alert Pain management: pain level controlled Vital Signs Assessment: post-procedure vital signs reviewed and stable Respiratory status: spontaneous breathing, nonlabored ventilation, respiratory function stable and patient connected to nasal cannula oxygen Cardiovascular status: blood pressure returned to baseline and stable Postop Assessment: no signs of nausea or vomiting Anesthetic complications: no       Last Vitals:  Vitals:   01/16/17 1430 01/16/17 1500  BP: 117/82 120/80  Pulse: 70 76  Resp: 12 15  Temp: 36.9 C 37 C    Last Pain:  Vitals:   01/16/17 1505  TempSrc:   PainSc: Asleep                 [name]

## 2017-01-16 NOTE — Transfer of Care (Signed)
Immediate Anesthesia Transfer of Care Note  Patient: Sheila Willis  Procedure(s) Performed: Procedure(s): LAPAROSCOPIC ASSISTED COLOSTOMY CLOSURE (N/A)  Patient Location: PACU  Anesthesia Type:General  Level of Consciousness: Patient easily awoken, sedated, comfortable, cooperative, following commands, responds to stimulation.   Airway & Oxygen Therapy: Patient spontaneously breathing, ventilating well, oxygen via simple oxygen mask.  Post-op Assessment: Report given to PACU RN, vital signs reviewed and stable, moving all extremities.   Post vital signs: Reviewed and stable.  Complications: No apparent anesthesia complications  Last Vitals:  Vitals:   01/16/17 0833 01/16/17 1325  BP: 106/69 128/89  Pulse: 89 (!) 102  Resp: 16   Temp: 36.6 C     Last Pain:  Vitals:   01/16/17 0843  TempSrc:   PainSc: 0-No pain      Patients Stated Pain Goal: 3 (01/16/17 0843)  Complications: No apparent anesthesia complications

## 2017-01-16 NOTE — Anesthesia Preprocedure Evaluation (Signed)
Anesthesia Evaluation  Patient identified by MRN, date of birth, ID band Patient awake    Reviewed: Allergy & Precautions, NPO status , Patient's Chart, lab work & pertinent test results  Airway Mallampati: II  TM Distance: >3 FB Neck ROM: Full    Dental  (+) Teeth Intact, Dental Advisory Given   Pulmonary former smoker,    breath sounds clear to auscultation       Cardiovascular negative cardio ROS   Rhythm:Regular Rate:Normal     Neuro/Psych  Headaches, PSYCHIATRIC DISORDERS Anxiety Depression    GI/Hepatic Neg liver ROS, GERD  ,  Endo/Other    Renal/GU   negative genitourinary   Musculoskeletal negative musculoskeletal ROS (+)   Abdominal   Peds negative pediatric ROS (+)  Hematology negative hematology ROS (+)   Anesthesia Other Findings   Reproductive/Obstetrics negative OB ROS                             Anesthesia Physical Anesthesia Plan  ASA: II  Anesthesia Plan: General   Post-op Pain Management:    Induction: Intravenous  Airway Management Planned: Oral ETT  Additional Equipment:   Intra-op Plan:   Post-operative Plan: Extubation in OR  Informed Consent: I have reviewed the patients History and Physical, chart, labs and discussed the procedure including the risks, benefits and alternatives for the proposed anesthesia with the patient or authorized representative who has indicated his/her understanding and acceptance.   Dental advisory given  Plan Discussed with: CRNA  Anesthesia Plan Comments:         Anesthesia Quick Evaluation

## 2017-01-16 NOTE — H&P (Signed)
Sheila Willis is an 31 y.o. female.   Chief Complaint:  Here for elective colostomy closure HPI:  This is a 31 year old female who had a complex left pelvic abscess. Percutaneous drainage was performed. 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 She subsequently underwent a partial colectomy and colostomy in September 2017. She has completely recovered. She now presents for colostomy closure.  Past Medical History:  Diagnosis Date  . Anemia   . Depression    hx of   . GERD (gastroesophageal reflux disease)    hx of   . Gestational diabetes    hx of 2013-2014 no problems since   . Headache(784.0)   . PCOS (polycystic ovarian syndrome)   . Postpartum care following vaginal delivery (2/1) 11/13/2012  . SVD (spontaneous vaginal delivery) 11/13/2012    Past Surgical History:  Procedure Laterality Date  . APPENDECTOMY  05/15/2016   Procedure: incidental APPENDECTOMY;  Surgeon: Jackolyn Confer, MD;  Location: [facility];  Service: General;;  . COLECTOMY WITH COLOSTOMY CREATION/HARTMANN PROCEDURE N/A 05/15/2016   Procedure: partial  COLECTOMY AND  COLOSTOMY;  Surgeon: Jackolyn Confer, MD;  Location: [facility];  Service: General;  Laterality: N/A;  . CYSTOSCOPY W/ URETERAL STENT PLACEMENT Left 05/15/2016   Procedure: CYSTOSCOPY;  Surgeon: Kathie Rhodes, MD;  Location: [facility];  Service: Urology;  Laterality: Left;  . CYSTOSCOPY W/ URETERAL STENT PLACEMENT Left 05/25/2016   Procedure: CYSTOSCOPY WITH RETROGRADE PYELOGRAM/LEFT URETERAL STENT PLACEMENT;  Surgeon: Alexis Frock, MD;  Location: [facility];  Service: Urology;  Laterality: Left;  . CYSTOSCOPY W/ URETERAL STENT REMOVAL  01/06/2017  . INCISION AND DRAINAGE ABSCESS N/A 05/15/2016   Procedure: DRAINAGE ABSCESS OF INTRA ABDOMINAL ABSCESS;  Surgeon: Jackolyn Confer, MD;  Location: [facility];  Service: General;  Laterality: N/A;  . LAPAROTOMY N/A 05/15/2016   Procedure: EXPLORATORY LAPAROTOMY;  Surgeon: Jackolyn Confer, MD;  Location: [facility];   Service: General;  Laterality: N/A;  . NO PAST SURGERIES      Family History  Problem Relation Age of Onset  . Cancer Other   . Hypertension Other   . Stroke Other   . Heart attack Other   . Heart disease Mother   . Hypertension Mother   . Cancer Mother     ovarian   Social History:  reports that she quit smoking about 9 months ago. Her smoking use included Cigarettes. She smoked 0.50 packs per day. She has never used smokeless tobacco. She reports that she does not drink alcohol or use drugs.  Allergies:  Allergies  Allergen Reactions  . Cefotetan Anaphylaxis    Required hosp for rash  . Contrast Media [Iodinated Diagnostic Agents] Itching, Swelling and Rash    INTRAVENOUS CONTRAST AFTER CT SCAN  . Latex Itching  . Doxycycline Itching and Rash    Medications Prior to Admission  Medication Sig Dispense Refill  . bisacodyl (DULCOLAX) 5 MG EC tablet Take 20 mg by mouth once. 4 tablets   Will take 1 day prior to procedure    . metroNIDAZOLE (FLAGYL) 500 MG tablet Take 1,000 mg by mouth 3 (three) times daily. 1 day prior to procedure    . neomycin (MYCIFRADIN) 500 MG tablet Take 1,000 mg by mouth 3 (three) times daily. 1 day prior to procedure    . Polyethylene Glycol 3350 (MIRALAX PO) Take by mouth once. Mix in 64 oz of water and drink 1 day prior to procedure    . camphor-menthol (SARNA) lotion Apply topically  as needed for itching. (Patient not taking: Reported on 01/14/2017) 222 mL 0  . fentaNYL (DURAGESIC - DOSED MCG/HR) 50 MCG/HR Place 1 patch (50 mcg total) onto the skin every 3 (three) days. (Patient not taking: Reported on 01/14/2017) 2 patch 0  . hydrOXYzine (ATARAX/VISTARIL) 25 MG tablet Take 1 tablet (25 mg total) by mouth every 8 (eight) hours as needed for itching. (Patient not taking: Reported on 01/14/2017) 15 tablet 0  . loratadine (CLARITIN) 10 MG tablet Take 1 tablet (10 mg total) by mouth daily. (Patient not taking: Reported on 01/14/2017) 30 tablet 0  . mirabegron ER  (MYRBETRIQ) 50 MG TB24 tablet Take 1 tablet (50 mg total) by mouth daily. (Patient not taking: Reported on 01/14/2017) 30 tablet 0  . morphine (MSIR) 15 MG tablet Take 1 tablet (15 mg total) by mouth every 4 (four) hours as needed for severe pain. (Patient not taking: Reported on 01/14/2017) 20 tablet 0  . ondansetron (ZOFRAN-ODT) 8 MG disintegrating tablet Take 1 tablet (8 mg total) by mouth 2 (two) times daily. (Patient not taking: Reported on 01/14/2017) 20 tablet 0  . polyethylene glycol (MIRALAX / GLYCOLAX) packet You can follow package instructions and use if you think your constipated.  Right now you do not really need it.  The Pain medicine can cause you to be constipated.  You can buy at any drug store over the counter. (Patient not taking: Reported on 01/14/2017) 14 each 0  . predniSONE (DELTASONE) 20 MG tablet Take 2 tabs po daily x 3 days, then take 1 tab po daily x 4 days, then STOP. (Patient not taking: Reported on 01/14/2017) 10 tablet 0  . URELLE (URELLE/URISED) 81 MG TABS tablet Take 1 tablet (81 mg total) by mouth 4 (four) times daily. (Patient not taking: Reported on 01/14/2017) 60 each 0    Results for orders placed or performed during the hospital encounter of 01/14/17 (from the past 48 hour(s))  CBC WITH DIFFERENTIAL     Status: None   Collection Time: 01/14/17  9:33 AM  Result Value Ref Range   WBC 7.6 4.0 - 10.5 K/uL   RBC 4.33 3.87 - 5.11 MIL/uL   Hemoglobin 13.0 12.0 - 15.0 g/dL   HCT 39.7 36.0 - 46.0 %   MCV 91.7 78.0 - 100.0 fL   MCH 30.0 26.0 - 34.0 pg   MCHC 32.7 30.0 - 36.0 g/dL   RDW 13.8 11.5 - 15.5 %   Platelets 248 150 - 400 K/uL   Neutrophils Relative % 60 %   Neutro Abs 4.5 1.7 - 7.7 K/uL   Lymphocytes Relative 31 %   Lymphs Abs 2.4 0.7 - 4.0 K/uL   Monocytes Relative 7 %   Monocytes Absolute 0.5 0.1 - 1.0 K/uL   Eosinophils Relative 2 %   Eosinophils Absolute 0.2 0.0 - 0.7 K/uL   Basophils Relative 0 %   Basophils Absolute 0.0 0.0 - 0.1 K/uL  Comprehensive  metabolic panel     Status: Abnormal   Collection Time: 01/14/17  9:33 AM  Result Value Ref Range   Sodium 136 135 - 145 mmol/L   Potassium 4.0 3.5 - 5.1 mmol/L   Chloride 104 101 - 111 mmol/L   CO2 24 22 - 32 mmol/L   Glucose, Bld 93 65 - 99 mg/dL   BUN 15 6 - 20 mg/dL   Creatinine, Ser 0.85 0.44 - 1.00 mg/dL   Calcium 8.9 8.9 - 10.3 mg/dL   Total Protein 6.7  6.5 - 8.1 g/dL   Albumin 3.6 3.5 - 5.0 g/dL   AST 16 15 - 41 U/L   ALT 6 (L) 14 - 54 U/L   Alkaline Phosphatase 82 38 - 126 U/L   Total Bilirubin 0.6 0.3 - 1.2 mg/dL   GFR calc non Af Amer >60 >60 mL/min   GFR calc Af Amer >60 >60 mL/min    Comment: (NOTE) The eGFR has been calculated using the CKD EPI equation. This calculation has not been validated in all clinical situations. eGFR's persistently <60 mL/min signify possible Chronic Kidney Disease.    Anion gap 8 5 - 15  Type and screen     Status: None   Collection Time: 01/14/17  9:33 AM  Result Value Ref Range   ABO/RH(D) B POS    Antibody Screen NEG    Sample Expiration 01/19/2017    Extend sample reason NO TRANSFUSIONS OR PREGNANCY IN THE PAST 3 MONTHS   hCG, serum, qualitative     Status: None   Collection Time: 01/14/17  9:33 AM  Result Value Ref Range   Preg, Serum NEGATIVE NEGATIVE    Comment:        THE SENSITIVITY OF THIS METHODOLOGY IS >10 mIU/mL.    No results found.  Review of Systems  Constitutional: Negative for chills and fever.  HENT: Negative for sore throat.   Respiratory: Negative for cough.   Gastrointestinal: Negative for diarrhea, nausea and vomiting.    Blood pressure 106/69, pulse 89, temperature 97.8 F (36.6 C), temperature source Oral, resp. rate 16, height 5' 2"  (1.575 m), weight 64.9 kg (143 lb), SpO2 100 %. Physical Exam  Constitutional: She appears well-developed and well-nourished. No distress.  HENT:  Head: Normocephalic and atraumatic.  Cardiovascular: Normal rate and regular rhythm.   Respiratory: Effort normal and  breath sounds normal.  GI: Soft.  Lower midline scar. Left sided colostomy present.  Musculoskeletal: She exhibits no edema.  Skin: Skin is warm and dry.  Psychiatric: She has a normal mood and affect. Her behavior is normal.     Assessment/Plan Colostomy in place.  Plan: Laparoscopic assisted colostomy reversal.  The procedure and risks have been discussed with her.  Odis Hollingshead, MD 01/16/2017, 9:28 AM

## 2017-01-17 LAB — CBC
HEMATOCRIT: 35.8 % — AB (ref 36.0–46.0)
HEMOGLOBIN: 11.9 g/dL — AB (ref 12.0–15.0)
MCH: 31.3 pg (ref 26.0–34.0)
MCHC: 33.2 g/dL (ref 30.0–36.0)
MCV: 94.2 fL (ref 78.0–100.0)
Platelets: 216 10*3/uL (ref 150–400)
RBC: 3.8 MIL/uL — ABNORMAL LOW (ref 3.87–5.11)
RDW: 13.9 % (ref 11.5–15.5)
WBC: 13.2 10*3/uL — ABNORMAL HIGH (ref 4.0–10.5)

## 2017-01-17 LAB — BASIC METABOLIC PANEL
ANION GAP: 5 (ref 5–15)
BUN: 6 mg/dL (ref 6–20)
CHLORIDE: 109 mmol/L (ref 101–111)
CO2: 25 mmol/L (ref 22–32)
Calcium: 8.4 mg/dL — ABNORMAL LOW (ref 8.9–10.3)
Creatinine, Ser: 0.8 mg/dL (ref 0.44–1.00)
GFR calc Af Amer: >60 mL/min (ref >60)
GFR calc non Af Amer: >60 mL/min (ref >60)
GLUCOSE: 128 mg/dL — AB (ref 65–99)
POTASSIUM: 4.2 mmol/L (ref 3.5–5.1)
Sodium: 139 mmol/L (ref 135–145)

## 2017-01-17 NOTE — Progress Notes (Signed)
1 Day Post-Op  Subjective: Pt w/ NAE overnight Abd soreness, PCA is helping with pain  Objective: Vital signs in last 24 hours: Temp:  [97.6 F (36.4 C)-98.7 F (37.1 C)] 98.7 F (37.1 C) (04/07 0545) Pulse Rate:  [56-102] 56 (04/07 0545) Resp:  [10-20] 20 (04/07 0730) BP: (101-128)/(71-93) 104/73 (04/07 0545) SpO2:  [98 %-100 %] 100 % (04/07 0730) Last BM Date: 01/15/17  Intake/Output from previous day: 04/06 0701 - 04/07 0700 In: 3849.6 [P.O.:60; I.V.:3739.6; IV Piggyback:50] Out: 1000 [Urine:950; Blood:50] Intake/Output this shift: No intake/output data recorded.  General appearance: alert and cooperative GI: soft, apprtop ttp, dressing clean and in place.  Lab Results:   Recent Labs  01/14/17 0933 01/17/17 0452  WBC 7.6 13.2*  HGB 13.0 11.9*  HCT 39.7 35.8*  PLT 248 216   BMET  Recent Labs  01/14/17 0933 01/17/17 0452  NA 136 139  K 4.0 4.2  CL 104 109  CO2 24 25  GLUCOSE 93 128*  BUN 15 6  CREATININE 0.85 0.80  CALCIUM 8.9 8.4*    Anti-infectives: Anti-infectives    Start     Dose/Rate Route Frequency Ordered Stop   01/16/17 1800  clindamycin (CLEOCIN) IVPB 900 mg     900 mg 100 mL/hr over 30 Minutes Intravenous Every 8 hours 01/16/17 1506 01/16/17 1915   01/16/17 0845  clindamycin (CLEOCIN) IVPB 900 mg     900 mg 100 mL/hr over 30 Minutes Intravenous  Once 01/16/17 0831 01/16/17 0946   01/16/17 0600  gentamicin (GARAMYCIN) 280 mg in dextrose 5 % 100 mL IVPB     280 mg 214 mL/hr over 30 Minutes Intravenous  Once 01/15/17 1150 01/16/17 1050      Assessment/Plan: s/p Procedure(s): LAPAROSCOPIC ASSISTED COLOSTOMY CLOSURE (N/A) -Encourage OOBTC, ambulation with asst -IS -awaiting bowel function, likely to start clears tomorrow  LOS: 1 day    Marigene Ehlers., Jed Limerick 01/17/2017

## 2017-01-18 NOTE — Progress Notes (Signed)
2 Days Post-Op  Subjective: Pt with NAE  Hungry  No bowel function  Objective: Vital signs in last 24 hours: Temp:  [97.8 F (36.6 C)-98.9 F (37.2 C)] 97.8 F (36.6 C) (04/08 0524) Pulse Rate:  [53-78] 53 (04/08 0524) Resp:  [13-26] 16 (04/08 0715) BP: (90-110)/(57-81) 90/57 (04/08 0524) SpO2:  [98 %-100 %] 98 % (04/08 0715) Last BM Date: 01/15/17  Intake/Output from previous day: 04/07 0701 - 04/08 0700 In: 4000 [I.V.:4000] Out: 2630 [Urine:2630] Intake/Output this shift: No intake/output data recorded.  General appearance: alert and cooperative GI: soft, approp ttp, dressings in place c/d/i  Lab Results:   Recent Labs  01/17/17 0452  WBC 13.2*  HGB 11.9*  HCT 35.8*  PLT 216   BMET  Recent Labs  01/17/17 0452  NA 139  K 4.2  CL 109  CO2 25  GLUCOSE 128*  BUN 6  CREATININE 0.80  CALCIUM 8.4*   Assessment/Plan: s/p Procedure(s): LAPAROSCOPIC ASSISTED COLOSTOMY CLOSURE (N/A) Advance diet to FLD Pain control Mobilize   LOS: 2 days    Marigene Ehlers., Jed Limerick 01/18/2017

## 2017-01-19 ENCOUNTER — Encounter (HOSPITAL_COMMUNITY): Payer: Self-pay | Admitting: General Surgery

## 2017-01-19 MED ORDER — PANTOPRAZOLE SODIUM 40 MG PO TBEC
40.0000 mg | DELAYED_RELEASE_TABLET | Freq: Every day | ORAL | Status: DC
  Administered 2017-01-19 – 2017-01-21 (×3): 40 mg via ORAL
  Filled 2017-01-19 (×3): qty 1

## 2017-01-19 NOTE — Progress Notes (Signed)
Assessment Active Problems:   Colostomy in place [facility]) s/p laparoscopic assisted colostomy closure 01/16/17-no bowel function yet; wound look good    Plan:  Continue liquid diet.  Wait for return of bowel function. Dressing change to colostomy site wound daily.   LOS: 3 days     3 Days Post-Op  Chief Complaint/Subjective: A little nausea.  No BM or flatus yet.  Adequate pain control.  Objective: Vital signs in last 24 hours: Temp:  [97.3 F (36.3 C)-98.8 F (37.1 C)] 98.5 F (36.9 C) (04/09 0621) Pulse Rate:  [55-73] 55 (04/09 0621) Resp:  [16-20] 16 (04/09 0621) BP: (107-110)/(65-76) 109/69 (04/09 0621) SpO2:  [98 %-100 %] 99 % (04/09 0621) Last BM Date: 01/15/17  Intake/Output from previous day: 04/08 0701 - 04/09 0700 In: 3600 [P.O.:600; I.V.:3000] Out: 3250 [Urine:3250] Intake/Output this shift: No intake/output data recorded.  PE: General- In NAD.  Awake and alert. Abdomen-soft, few bowel sounds, colostomy site wound clean-repacked, other wounds clean and intact  Lab Results:   Recent Labs  01/17/17 0452  WBC 13.2*  HGB 11.9*  HCT 35.8*  PLT 216   BMET  Recent Labs  01/17/17 0452  NA 139  K 4.2  CL 109  CO2 25  GLUCOSE 128*  BUN 6  CREATININE 0.80  CALCIUM 8.4*   PT/INR No results for input(s): LABPROT, INR in the last 72 hours. Comprehensive Metabolic Panel:    Component Value Date/Time   NA 139 01/17/2017 0452   NA 136 01/14/2017 0933   K 4.2 01/17/2017 0452   K 4.0 01/14/2017 0933   CL 109 01/17/2017 0452   CL 104 01/14/2017 0933   CO2 25 01/17/2017 0452   CO2 24 01/14/2017 0933   BUN 6 01/17/2017 0452   BUN 15 01/14/2017 0933   CREATININE 0.80 01/17/2017 0452   CREATININE 0.85 01/14/2017 0933   GLUCOSE 128 (H) 01/17/2017 0452   GLUCOSE 93 01/14/2017 0933   CALCIUM 8.4 (L) 01/17/2017 0452   CALCIUM 8.9 01/14/2017 0933   AST 16 01/14/2017 0933   AST 16 06/20/2016 0349   ALT 6 (L) 01/14/2017 0933   ALT 12 (L) 06/20/2016 0349   ALKPHOS 82 01/14/2017 0933   ALKPHOS 66 06/20/2016 0349   BILITOT 0.6 01/14/2017 0933   BILITOT 0.2 (L) 06/20/2016 0349   PROT 6.7 01/14/2017 0933   PROT 5.8 (L) 06/20/2016 0349   ALBUMIN 3.6 01/14/2017 0933   ALBUMIN 2.2 (L) 06/20/2016 0349     Studies/Results: No results found.  Anti-infectives: Anti-infectives    Start     Dose/Rate Route Frequency Ordered Stop   01/16/17 1800  clindamycin (CLEOCIN) IVPB 900 mg     900 mg 100 mL/hr over 30 Minutes Intravenous Every 8 hours 01/16/17 1506 01/16/17 1915   01/16/17 0845  clindamycin (CLEOCIN) IVPB 900 mg     900 mg 100 mL/hr over 30 Minutes Intravenous  Once 01/16/17 0831 01/16/17 0946   01/16/17 0600  gentamicin (GARAMYCIN) 280 mg in dextrose 5 % 100 mL IVPB     280 mg 214 mL/hr over 30 Minutes Intravenous  Once 01/15/17 1150 01/16/17 1050       Maicy Filip J 01/19/2017

## 2017-01-20 MED ORDER — OXYCODONE HCL 5 MG PO TABS
5.0000 mg | ORAL_TABLET | ORAL | Status: DC | PRN
  Administered 2017-01-20 (×3): 10 mg via ORAL
  Administered 2017-01-21: 5 mg via ORAL
  Administered 2017-01-21 (×2): 10 mg via ORAL
  Filled 2017-01-20 (×4): qty 2
  Filled 2017-01-20 (×2): qty 1
  Filled 2017-01-20: qty 2

## 2017-01-20 NOTE — Progress Notes (Signed)
Assessment Active Problems:   Colostomy in place [facility]) s/p laparoscopic assisted colostomy closure 01/16/17-bowel function starting to return    Plan:  Advance diet.  Stop PCA.  Oral analgesic.   LOS: 4 days     4 Days Post-Op  Chief Complaint/Subjective: Feels good.  Passing gas.  Walking.  Objective: Vital signs in last 24 hours: Temp:  [98.4 F (36.9 C)-98.7 F (37.1 C)] 98.7 F (37.1 C) (04/10 0500) Pulse Rate:  [65-78] 76 (04/10 0500) Resp:  [16-18] 16 (04/10 0500) BP: (97-119)/(61-82) 106/62 (04/10 0500) SpO2:  [97 %-100 %] 100 % (04/10 0500) FiO2 (%):  [0 %-36 %] 34 % (04/10 0400) Last BM Date: 01/15/17  Intake/Output from previous day: 04/09 0701 - 04/10 0700 In: 4080 [P.O.:1080; I.V.:3000] Out: 800 [Urine:800] Intake/Output this shift: No intake/output data recorded.  PE: General- In NAD.  Awake and alert. Abdomen-soft, colostomy site wound clean-repacked, other wounds clean and intact  Lab Results:  No results for input(s): WBC, HGB, HCT, PLT in the last 72 hours. BMET No results for input(s): NA, K, CL, CO2, GLUCOSE, BUN, CREATININE, CALCIUM in the last 72 hours. PT/INR No results for input(s): LABPROT, INR in the last 72 hours. Comprehensive Metabolic Panel:    Component Value Date/Time   NA 139 01/17/2017 0452   NA 136 01/14/2017 0933   K 4.2 01/17/2017 0452   K 4.0 01/14/2017 0933   CL 109 01/17/2017 0452   CL 104 01/14/2017 0933   CO2 25 01/17/2017 0452   CO2 24 01/14/2017 0933   BUN 6 01/17/2017 0452   BUN 15 01/14/2017 0933   CREATININE 0.80 01/17/2017 0452   CREATININE 0.85 01/14/2017 0933   GLUCOSE 128 (H) 01/17/2017 0452   GLUCOSE 93 01/14/2017 0933   CALCIUM 8.4 (L) 01/17/2017 0452   CALCIUM 8.9 01/14/2017 0933   AST 16 01/14/2017 0933   AST 16 06/20/2016 0349   ALT 6 (L) 01/14/2017 0933   ALT 12 (L) 06/20/2016 0349   ALKPHOS 82 01/14/2017 0933   ALKPHOS 66 06/20/2016 0349   BILITOT 0.6 01/14/2017 0933   BILITOT 0.2 (L)  06/20/2016 0349   PROT 6.7 01/14/2017 0933   PROT 5.8 (L) 06/20/2016 0349   ALBUMIN 3.6 01/14/2017 0933   ALBUMIN 2.2 (L) 06/20/2016 0349     Studies/Results: No results found.  Anti-infectives: Anti-infectives    Start     Dose/Rate Route Frequency Ordered Stop   01/16/17 1800  clindamycin (CLEOCIN) IVPB 900 mg     900 mg 100 mL/hr over 30 Minutes Intravenous Every 8 hours 01/16/17 1506 01/16/17 1915   01/16/17 0845  clindamycin (CLEOCIN) IVPB 900 mg     900 mg 100 mL/hr over 30 Minutes Intravenous  Once 01/16/17 0831 01/16/17 0946   01/16/17 0600  gentamicin (GARAMYCIN) 280 mg in dextrose 5 % 100 mL IVPB     280 mg 214 mL/hr over 30 Minutes Intravenous  Once 01/15/17 1150 01/16/17 1050       Sheila Willis J 01/20/2017

## 2017-01-21 MED ORDER — OXYCODONE HCL 5 MG PO TABS: 5.0000 mg | ORAL_TABLET | ORAL | 0 refills | Status: DC | PRN

## 2017-01-21 NOTE — Progress Notes (Signed)
Reviewed d/c instructions with pt. No questions at this time. Pt given Rx for pain medications. Pt wheeled to front entrance with personal belongs.

## 2017-01-21 NOTE — Discharge Summary (Signed)
Physician Discharge Summary  Patient ID: Sheila Willis MRN: [id number] DOB/AGE: November 06, 1985 31 y.o.  Admit date: 01/16/2017 Discharge date: 01/21/2017  Admission Diagnoses:  Colostomy in place  Discharge Diagnoses:  Active Problems:   Colostomy in place [facility]) s/p laparoscopic assisted colostomy reversal 01/16/17   Discharged Condition: good  Hospital Course: She underwent the above procedure and tolerated it well.  She had a mild postop ileus but started having return of bowel function on POD #4.  Her diet was advanced, she was taught colostomy site wound care (saline damp to dry) and she was able to be discharged on 01/21/17.  Discharge instructions were given to her.   Discharge Exam: Blood pressure 99/65, pulse 67, temperature 98.4 F (36.9 C), temperature source Oral, resp. rate 16, height  (1.575 m), weight 64.9 kg (143 lb), SpO2 100 %.   Disposition: 06-Home-Health Care Svc   Allergies as of 01/21/2017      Reactions   Cefotetan Anaphylaxis   Required hosp for rash   Contrast Media [iodinated Diagnostic Agents] Itching, Swelling, Rash   INTRAVENOUS CONTRAST AFTER CT SCAN   Latex Itching   Doxycycline Itching, Rash      Medication List    STOP taking these medications   bisacodyl 5 MG EC tablet Commonly known as:  DULCOLAX   camphor-menthol lotion Commonly known as:  SARNA   fentaNYL 50 MCG/HR Commonly known as:  DURAGESIC - dosed mcg/hr   hydrOXYzine 25 MG tablet Commonly known as:  ATARAX/VISTARIL   loratadine 10 MG tablet Commonly known as:  CLARITIN   metroNIDAZOLE 500 MG tablet Commonly known as:  FLAGYL   mirabegron ER 50 MG Tb24 tablet Commonly known as:  MYRBETRIQ   MIRALAX PO   morphine 15 MG tablet Commonly known as:  MSIR   neomycin 500 MG tablet Commonly known as:  MYCIFRADIN   ondansetron 8 MG disintegrating tablet Commonly known as:  ZOFRAN-ODT   polyethylene glycol packet Commonly known as:  MIRALAX / GLYCOLAX   predniSONE  20 MG tablet Commonly known as:  DELTASONE   URELLE 81 MG Tabs tablet     TAKE these medications   oxyCODONE 5 MG immediate release tablet Commonly known as:  Oxy IR/ROXICODONE Take 1-2 tablets (5-10 mg total) by mouth every 4 (four) hours as needed for moderate pain.        Signed: Adolph Pollack 01/21/2017, 8:41 AM

## 2017-01-21 NOTE — Progress Notes (Signed)
Assessment Active Problems:   Colostomy in place [facility]) s/p laparoscopic assisted colostomy closure 01/16/17-bowel function has returned.    Plan:  Discharge.  Instructions given to her.   LOS: 5 days     5 Days Post-Op  Chief Complaint/Subjective: Feeling good.  Showering.  Changing bandage.  Bowels moving.  Objective: Vital signs in last 24 hours: Temp:  [98.4 F (36.9 C)-99.6 F (37.6 C)] 98.4 F (36.9 C) (04/11 0535) Pulse Rate:  [67-82] 67 (04/11 0535) Resp:  [16] 16 (04/11 0535) BP: (99-122)/(65-78) 99/65 (04/11 0535) SpO2:  [98 %-100 %] 100 % (04/11 0535) Last BM Date: 01/20/17  Intake/Output from previous day: 04/10 0701 - 04/11 0700 In: 1320 [P.O.:1320] Out: -  Intake/Output this shift: No intake/output data recorded.  PE: General- In NAD.  Awake and alert. Abdomen-soft, colostomy site wound clean-repacked, other wounds clean and intact  Lab Results:  No results for input(s): WBC, HGB, HCT, PLT in the last 72 hours. BMET No results for input(s): NA, K, CL, CO2, GLUCOSE, BUN, CREATININE, CALCIUM in the last 72 hours. PT/INR No results for input(s): LABPROT, INR in the last 72 hours. Comprehensive Metabolic Panel:    Component Value Date/Time   NA 139 01/17/2017 0452   NA 136 01/14/2017 0933   K 4.2 01/17/2017 0452   K 4.0 01/14/2017 0933   CL 109 01/17/2017 0452   CL 104 01/14/2017 0933   CO2 25 01/17/2017 0452   CO2 24 01/14/2017 0933   BUN 6 01/17/2017 0452   BUN 15 01/14/2017 0933   CREATININE 0.80 01/17/2017 0452   CREATININE 0.85 01/14/2017 0933   GLUCOSE 128 (H) 01/17/2017 0452   GLUCOSE 93 01/14/2017 0933   CALCIUM 8.4 (L) 01/17/2017 0452   CALCIUM 8.9 01/14/2017 0933   AST 16 01/14/2017 0933   AST 16 06/20/2016 0349   ALT 6 (L) 01/14/2017 0933   ALT 12 (L) 06/20/2016 0349   ALKPHOS 82 01/14/2017 0933   ALKPHOS 66 06/20/2016 0349   BILITOT 0.6 01/14/2017 0933   BILITOT 0.2 (L) 06/20/2016 0349   PROT 6.7 01/14/2017 0933   PROT 5.8 (L)  06/20/2016 0349   ALBUMIN 3.6 01/14/2017 0933   ALBUMIN 2.2 (L) 06/20/2016 0349     Studies/Results: No results found.  Anti-infectives: Anti-infectives    Start     Dose/Rate Route Frequency Ordered Stop   01/16/17 1800  clindamycin (CLEOCIN) IVPB 900 mg     900 mg 100 mL/hr over 30 Minutes Intravenous Every 8 hours 01/16/17 1506 01/16/17 1915   01/16/17 0845  clindamycin (CLEOCIN) IVPB 900 mg     900 mg 100 mL/hr over 30 Minutes Intravenous  Once 01/16/17 0831 01/16/17 0946   01/16/17 0600  gentamicin (GARAMYCIN) 280 mg in dextrose 5 % 100 mL IVPB     280 mg 214 mL/hr over 30 Minutes Intravenous  Once 01/15/17 1150 01/16/17 1050       Meyli Boice J 01/21/2017

## 2017-01-21 NOTE — Discharge Instructions (Signed)
[facility]      [facility], [place] [phone]  COLON SURGERY: POST OP INSTRUCTIONS  Always review your discharge instruction sheet given to you by the facility where your surgery was performed.  IF YOU HAVE DISABILITY OR FAMILY LEAVE FORMS, YOU MUST BRING THEM TO THE OFFICE FOR PROCESSING.  PLEASE DO NOT GIVE THEM TO YOUR DOCTOR.  1. A prescription for pain medication may be given to you upon discharge.  Take your pain medication as prescribed, if needed.  If narcotic pain medicine is not needed, then you may take acetaminophen (Tylenol) or ibuprofen (Advil) as needed. 2. Take your usually prescribed medications unless otherwise directed. 3. If you need a refill on your pain medication, please contact your pharmacy. They will contact our office to request authorization.  Prescriptions will not be filled after 5pm or on week-ends. 4. You should follow lean protein, lowfat diet.  Do not overeat.  Be sure to include lots of fluids daily. Most patients will experience some swelling and bruising in the area of the incision. Ice pack will help. Swelling and bruising can take several days to resolve..  5. It is common to experience some constipation if taking pain medication after surgery.  Increasing fluid intake and taking a stool softener will usually help or prevent this problem from occurring.  A mild laxative (Milk of Magnesia or Miralax) should be taken according to package directions if there are no bowel movements after 48 hours. 6.  You may have steri-strips (small skin tapes) in place directly over the incision.  These strips should be left on the skin.  If your surgeon used skin glue on the incision, you may shower in 24 hours.  The glue will flake off over the next 2-3 weeks.  Any sutures or staples will be removed at the office during your follow-up visit. You may find that a light gauze bandage over your incision may keep your staples from being rubbed or pulled. You may shower and replace  the bandage daily. 7. ACTIVITIES:  You may resume regular (light) daily activities beginning the next day--such as daily self-care, walking, climbing stairs--gradually increasing activities as tolerated.  You may have sexual intercourse when it is comfortable.  Refrain from any heavy lifting or straining for at least 6 weeks.  Do not lift anything over 10 pounds.  a. You may drive when you no longer are taking prescription pain medication, you can comfortably wear a seatbelt, and you can safely maneuver your car and apply brakes b. Return to Work: _When released to do so by doctor.__________________________________ 8. You should see your doctor in the office for a follow-up appointment approximately 2-3 weeks after your surgery.  Make sure that you call for this appointment within a day or two after you arrive home to insure a convenient appointment time. OTHER INSTRUCTIONS:  _Change bandage daily.____________________________________________________________ _____________________________________________________________  WHEN TO CALL YOUR DOCTOR: 1. Fever over 101.0 2. Inability to urinate 3. Nausea and/or vomiting 4. Extreme swelling or bruising 5. Continued bleeding from incision. 6. Increased pain, redness, or drainage from the incision.  The clinic staff is available to answer your questions during regular business hours.  Please dont hesitate to call and ask to speak to one of the nurses if you have concerns.  For further questions, please visit [email or web address]

## 2017-03-13 NOTE — Addendum Note (Signed)
Addendum  created 03/13/17 1239 by Irianna Gilday D, MD   Sign clinical note    

## 2017-07-20 ENCOUNTER — Encounter (HOSPITAL_COMMUNITY): Payer: Self-pay | Admitting: Emergency Medicine

## 2017-07-20 ENCOUNTER — Emergency Department (HOSPITAL_COMMUNITY)
Admission: EM | Admit: 2017-07-20 | Discharge: 2017-07-20 | Disposition: A | Discharge status: Home / Self Care | Payer: Medicaid Other | Attending: Emergency Medicine | Admitting: Emergency Medicine

## 2017-07-20 DIAGNOSIS — Z87891 Personal history of nicotine dependence: Secondary | ICD-10-CM | POA: Insufficient documentation

## 2017-07-20 DIAGNOSIS — Z91040 Latex allergy status: Secondary | ICD-10-CM | POA: Diagnosis not present

## 2017-07-20 DIAGNOSIS — T7840XA Allergy, unspecified, initial encounter: Secondary | ICD-10-CM | POA: Diagnosis present

## 2017-07-20 MED ORDER — METHYLPREDNISOLONE SODIUM SUCC 125 MG IJ SOLR
125.0000 mg | Freq: Once | INTRAMUSCULAR | Status: AC
  Administered 2017-07-20: 125 mg via INTRAMUSCULAR
  Filled 2017-07-20: qty 2

## 2017-07-20 MED ORDER — PREDNISONE 10 MG PO TABS: 20.0000 mg | ORAL_TABLET | Freq: Every day | ORAL | 0 refills | Status: DC

## 2017-07-20 MED ORDER — DIPHENHYDRAMINE HCL 25 MG PO CAPS
25.0000 mg | ORAL_CAPSULE | Freq: Once | ORAL | Status: AC
  Administered 2017-07-20: 25 mg via ORAL
  Filled 2017-07-20: qty 1

## 2017-07-20 MED ORDER — FAMOTIDINE 20 MG PO TABS
20.0000 mg | ORAL_TABLET | Freq: Once | ORAL | Status: AC
  Administered 2017-07-20: 20 mg via ORAL
  Filled 2017-07-20: qty 1

## 2017-07-20 MED ORDER — FAMOTIDINE 20 MG PO TABS: 20.0000 mg | ORAL_TABLET | Freq: Two times a day (BID) | ORAL | 0 refills | Status: DC

## 2017-07-20 NOTE — ED Triage Notes (Signed)
Pt having allergic reaction to unknown source onset last evening after eating [employer] denies any other new items soaps or perfumes. Edema to bilat eyes denies SOB or difficulty swallowing or breathing. Has been takingh benadryl 25 mg q 4 hrs  W/o relief.

## 2017-07-20 NOTE — ED Provider Notes (Signed)
[facility]-EMERGENCY DEPT Provider Note   CSN: [id number] Arrival date & time: 07/20/17  7829     History   Chief Complaint Chief Complaint  Patient presents with  . Allergic Reaction    HPI Sheila Willis is a 31 y.o. female.  Patient complains of itching and swelling around her right eye   The history is provided by the patient.  Allergic Reaction  Presenting symptoms: itching and rash   Severity:  Moderate Prior allergic episodes:  No prior episodes Context: not animal exposure   Relieved by:  Nothing Worsened by:  Nothing Ineffective treatments:  Antihistamines   Past Medical History:  Diagnosis Date  . Anemia   . Depression    hx of   . GERD (gastroesophageal reflux disease)    hx of   . Gestational diabetes    hx of 2013-2014 no problems since   . Headache(784.0)   . PCOS (polycystic ovarian syndrome)   . Postpartum care following vaginal delivery (2/1) 11/13/2012  . SVD (spontaneous vaginal delivery) 11/13/2012    Patient Active Problem List   Diagnosis Date Noted  . Allergic reaction 06/20/2016  . Allergic drug rash 06/19/2016  . Hyponatremia 06/19/2016  . Sinus tachycardia 06/19/2016  . Hydronephrosis of left kidney 06/19/2016  . Leukocytosis   . 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 Colostomy in place [facility]) 06/01/2016  . Hydronephrosis left with infection s/p ureteral stenting 05/25/2016 06/01/2016  . 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 Anxiety state 05/27/2016  . Pain due to ureteral stent (HCC) 05/27/2016  . Pelvic abscess in female 05/15/2016  . TOA (tubo-ovarian abscess) 04/19/2016  . Pelvic pain in female 04/18/2016  . PCOS (polycystic ovarian syndrome) 11/14/2012  . Gestational diabetes mellitus, class A1 11/14/2012    Past Surgical History:  Procedure Laterality Date  . APPENDECTOMY  05/15/2016   Procedure: incidental APPENDECTOMY;  Surgeon: Avel Peace, MD;  Location: [facility]  ORS;  Service: General;;  . COLECTOMY WITH COLOSTOMY CREATION/HARTMANN PROCEDURE N/A 05/15/2016   Procedure: partial  COLECTOMY AND  COLOSTOMY;  Surgeon: Avel Peace, MD;  Location: [facility] ORS;  Service: General;  Laterality: N/A;  . COLOSTOMY TAKEDOWN N/A 01/16/2017   Procedure: LAPAROSCOPIC ASSISTED COLOSTOMY CLOSURE;  Surgeon: Avel Peace, MD;  Location: [facility] ORS;  Service: General;  Laterality: N/A;  . CYSTOSCOPY W/ URETERAL STENT PLACEMENT Left 05/15/2016   Procedure: CYSTOSCOPY;  Surgeon: Ihor Gully, MD;  Location: [facility] ORS;  Service: Urology;  Laterality: Left;  . CYSTOSCOPY W/ URETERAL STENT PLACEMENT Left 05/25/2016   Procedure: CYSTOSCOPY WITH RETROGRADE PYELOGRAM/LEFT URETERAL STENT PLACEMENT;  Surgeon: [name], MD;  Location: [facility] ORS;  Service: Urology;  Laterality: Left;  . CYSTOSCOPY W/ URETERAL STENT REMOVAL  01/06/2017  . INCISION AND DRAINAGE ABSCESS N/A 05/15/2016   Procedure: DRAINAGE ABSCESS OF INTRA ABDOMINAL ABSCESS;  Surgeon: Avel Peace, MD;  Location: [facility] ORS;  Service: General;  Laterality: N/A;  . LAPAROTOMY N/A 05/15/2016   Procedure: EXPLORATORY LAPAROTOMY;  Surgeon: Avel Peace, MD;  Location: [facility] ORS;  Service: General;  Laterality: N/A;  . NO PAST SURGERIES      OB History    Gravida Para Term Preterm AB Living   SAB TAB Ectopic Multiple Live Births       1   2       Home Medications    Prior to Admission medications   Medication Sig Start Date End Date Taking? Authorizing  Provider  ibuprofen (ADVIL,MOTRIN) 200 MG tablet Take 400 mg by mouth every 6 (six) hours as needed (reduce swelling due to allergic reaction).   Yes [provider]  famotidine (PEPCID) 20 MG tablet Take 1 tablet (20 mg total) by mouth 2 (two) times daily. 07/20/17   Bethann Berkshire, MD  predniSONE (DELTASONE) 10 MG tablet Take 2 tablets (20 mg total) by mouth daily. 07/20/17   Bethann Berkshire, MD    Family History Family History  Problem Relation Age of  Onset  . Cancer Other   . Hypertension Other   . Stroke Other   . Heart attack Other   . Heart disease Mother   . Hypertension Mother   . Cancer Mother        ovarian    Social History Social History  Substance Use Topics  . Smoking status: Former Smoker    Packs/day: 0.50    Types: Cigarettes    Quit date: 04/11/2016  . Smokeless tobacco: Never Used  . Alcohol use No     Allergies   Cefotetan; Contrast media [iodinated diagnostic agents]; Latex; and Doxycycline   Review of Systems Review of Systems  Constitutional: Negative for appetite change and fatigue.  HENT: Negative for congestion, ear discharge and sinus pressure.   Eyes: Negative for discharge.  Respiratory: Negative for cough.   Cardiovascular: Negative for chest pain.  Gastrointestinal: Negative for abdominal pain and diarrhea.  Genitourinary: Negative for frequency and hematuria.  Musculoskeletal: Negative for back pain.  Skin: Positive for itching and rash.  Neurological: Negative for seizures and headaches.  Psychiatric/Behavioral: Negative for hallucinations.     Physical Exam Updated Vital Signs BP 109/80 (BP Location: Right Arm)   Pulse 77   Temp 98 F (36.7 C) (Oral)   Resp 16   LMP 06/27/2017 (LMP Unknown)   SpO2 100%   Physical Exam  Constitutional: She is oriented to person, place, and time. She appears well-developed.  HENT:  Head: Normocephalic.  Patient has swelling redness around her right eye.  Eyes: Conjunctivae are normal.  Neck: No tracheal deviation present.  Cardiovascular:  No murmur heard. Musculoskeletal: Normal range of motion.  Neurological: She is oriented to person, place, and time.  Skin: Skin is warm.  Psychiatric: She has a normal mood and affect.     ED Treatments / Results  Labs (all labs ordered are listed, but only abnormal results are displayed) Labs Reviewed - No data to display  EKG  EKG Interpretation None       Radiology No results  found.  Procedures Procedures (including critical care time)  Medications Ordered in ED Medications  diphenhydrAMINE (BENADRYL) capsule 25 mg (25 mg Oral Given 07/20/17 0729)  famotidine (PEPCID) tablet 20 mg (20 mg Oral Given 07/20/17 0729)  methylPREDNISolone sodium succinate (SOLU-MEDROL) 125 mg/2 mL injection 125 mg (125 mg Intramuscular Given 07/20/17 0729)     Initial Impression / Assessment and Plan / ED Course  I have reviewed the triage vital signs and the nursing notes.  Pertinent labs & imaging results that were available during my care of the patient were reviewed by me and considered in my medical decision making (see chart for details).     Patient local allergic reaction to face around her right eye. She'll be placed on prednisone Pepcid Benadryl and follow-up with PCP if needed  Final Clinical Impressions(s) / ED Diagnoses   Final diagnoses:  Allergic reaction, initial encounter    New Prescriptions New Prescriptions  FAMOTIDINE (PEPCID) 20 MG TABLET    Take 1 tablet (20 mg total) by mouth 2 (two) times daily.   PREDNISONE (DELTASONE) 10 MG TABLET    Take 2 tablets (20 mg total) by mouth daily.     Bethann Berkshire, MD 07/20/17 [phone]

## 2017-07-20 NOTE — Discharge Instructions (Signed)
Follow up  this week if not improving.  Take benadryl  every 4-6 hours for rash and itching

## 2017-10-16 IMAGING — CT CT ABD-PELV W/ CM
2 of 4 series · 17 of 46 positions shown, 19 images · IV contrast (iopamidol)
Comparison: CT scan of April 20, 2016.

CLINICAL DATA: Pelvic abscesses.

EXAM:
CT ABDOMEN AND PELVIS WITH CONTRAST
TECHNIQUE: Multidetector CT imaging of the abdomen and pelvis was performed
using the standard protocol following bolus administration of
intravenous contrast.
CONTRAST:  100mL 4EZIH3-YAA IOPAMIDOL (4EZIH3-YAA) INJECTION 61%

[Series 2: rtn a/p with · axial · 0.69mm/px · z∈[+858,+1272]mm · 14 of 93 slices shown, 16 images]
[im 5/93  soft-tissue]
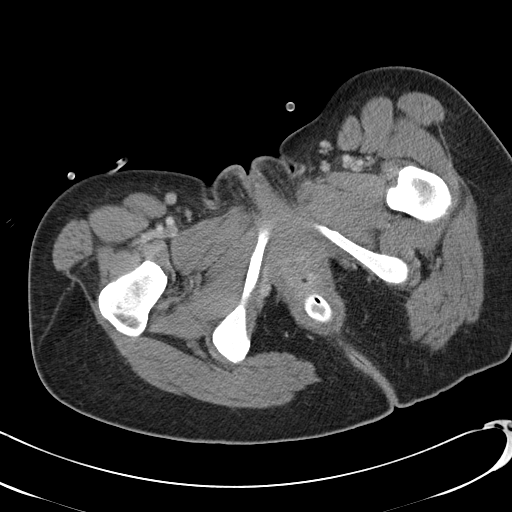
[im 5/93  bone]
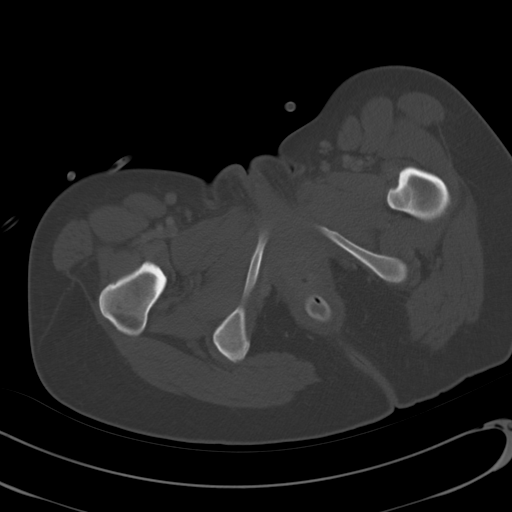
[im 10/93  soft-tissue]
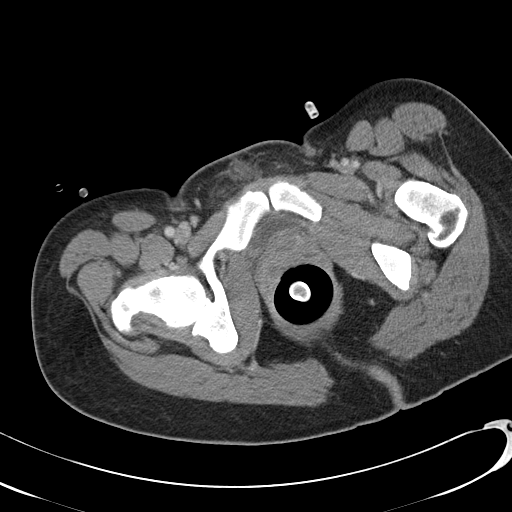
[im 20/93  soft-tissue]
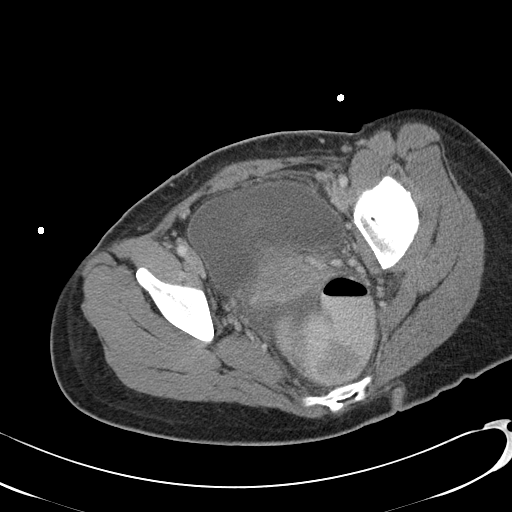
[im 25/93  soft-tissue]
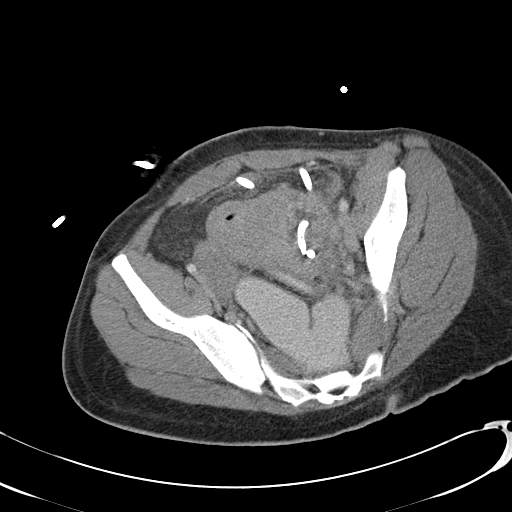
[im 30/93  soft-tissue]
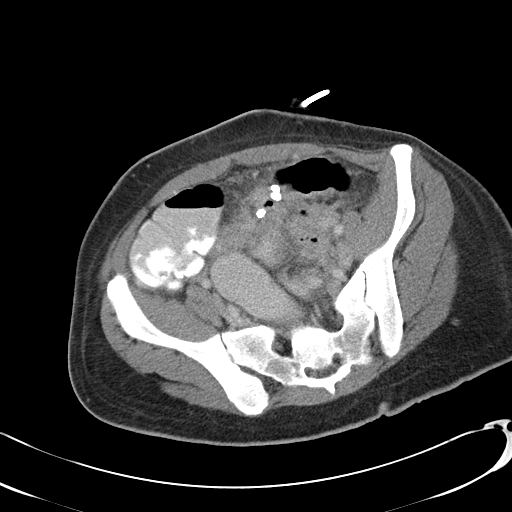
[im 39/93  soft-tissue]
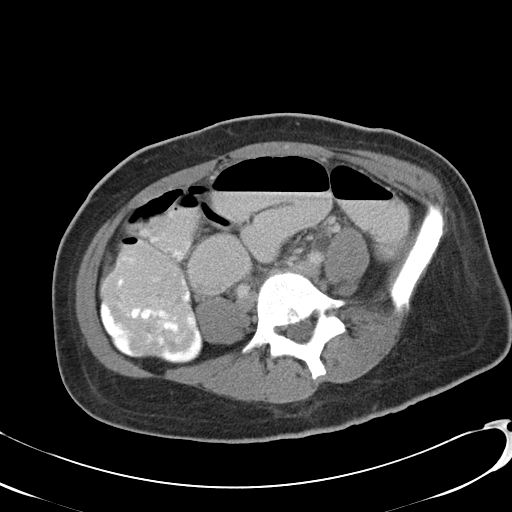
[im 44/93  soft-tissue]
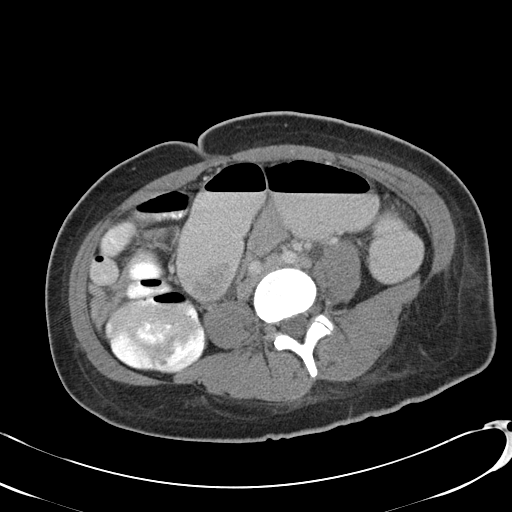
[im 49/93  soft-tissue]
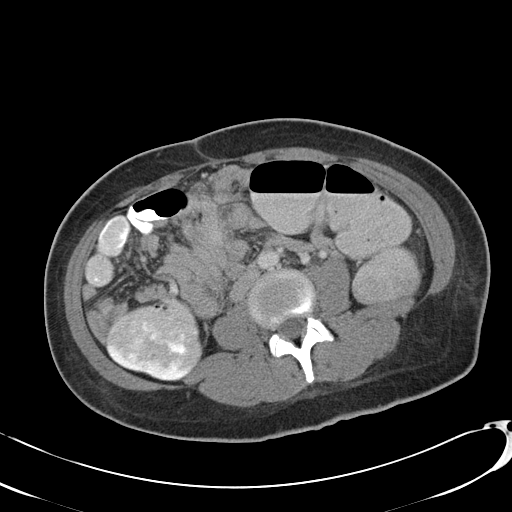
[im 54/93  soft-tissue]
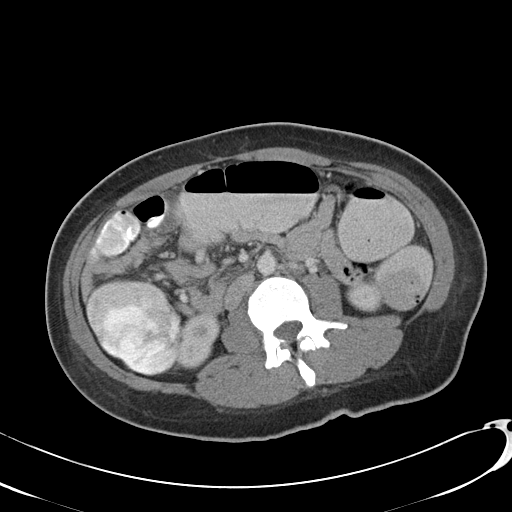
[im 54/93  bone]
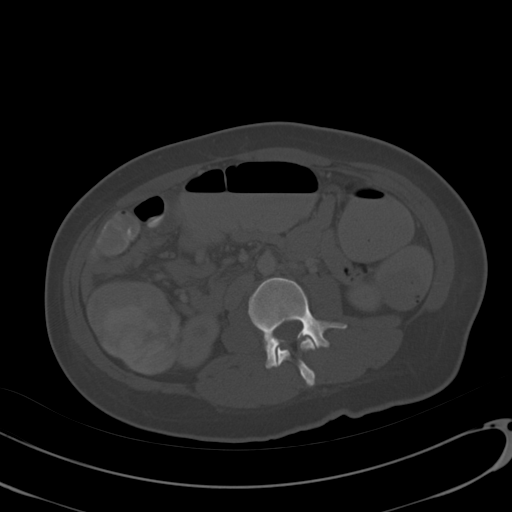
[im 63/93  soft-tissue]
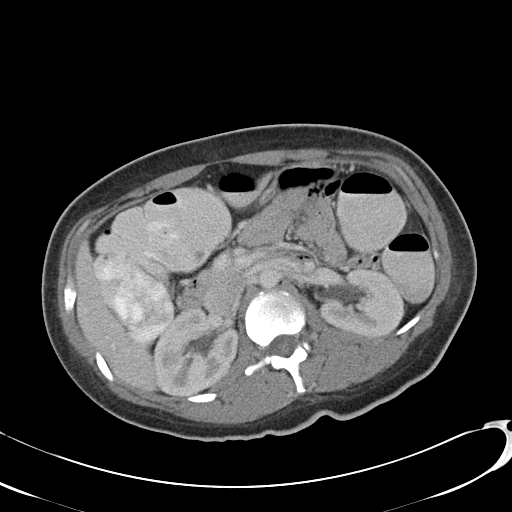
[im 68/93  soft-tissue]
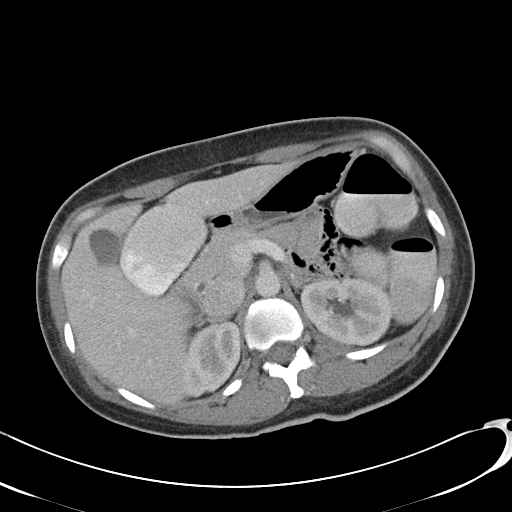
[im 73/93  soft-tissue]
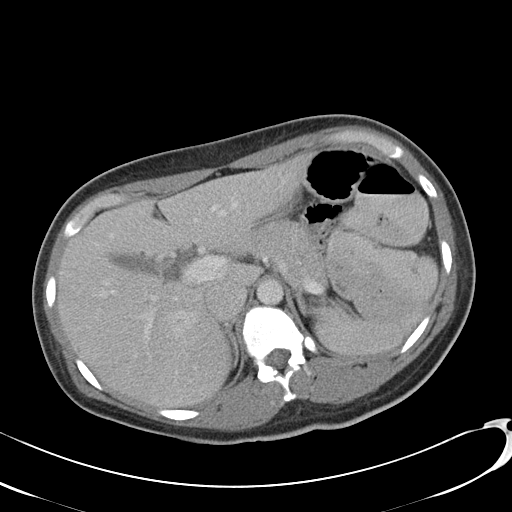
[im 83/93  soft-tissue]
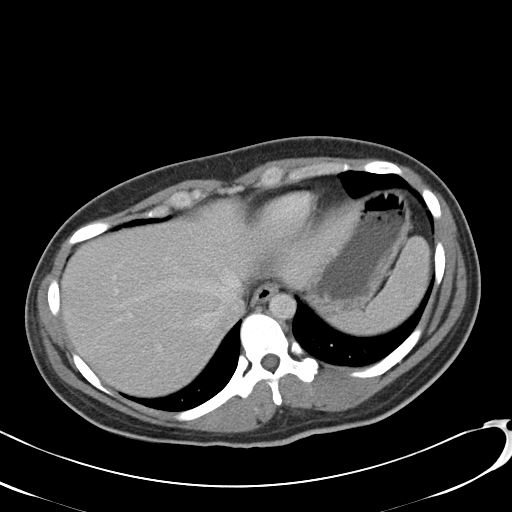
[im 88/93  soft-tissue]
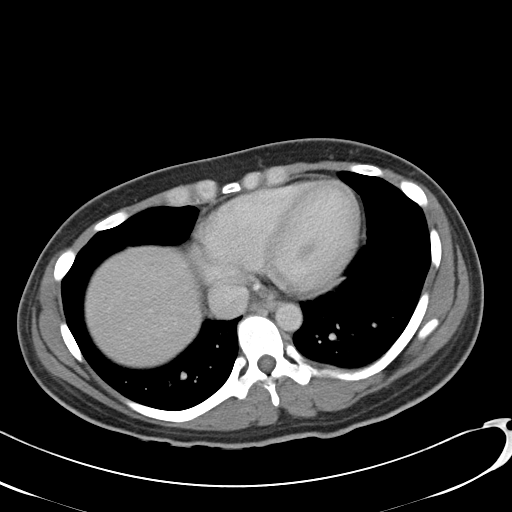

[Series 602: cor · coronal · 0.91mm/px · 3 of 104 slices shown]
[im 35/104  soft-tissue]
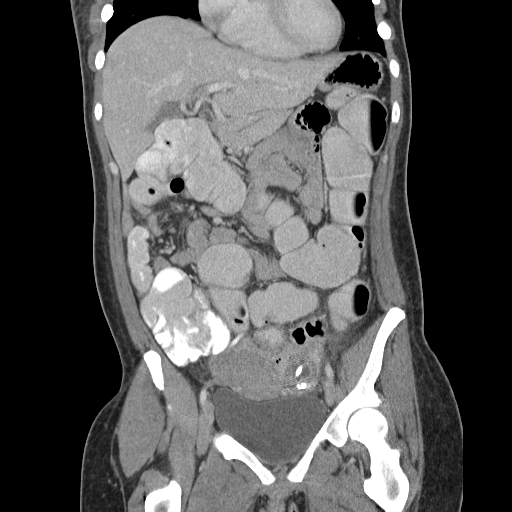
[im 46/104  soft-tissue]
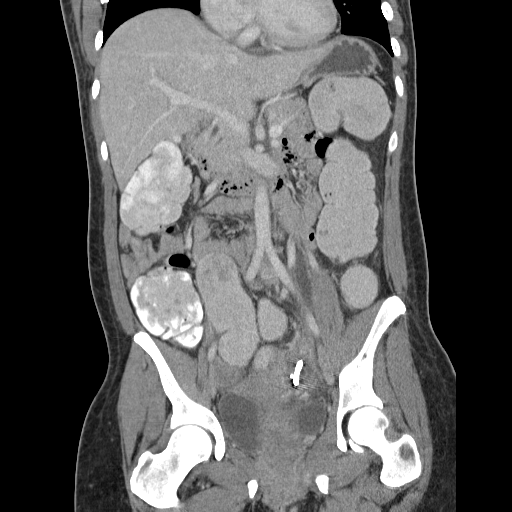
[im 58/104  soft-tissue]
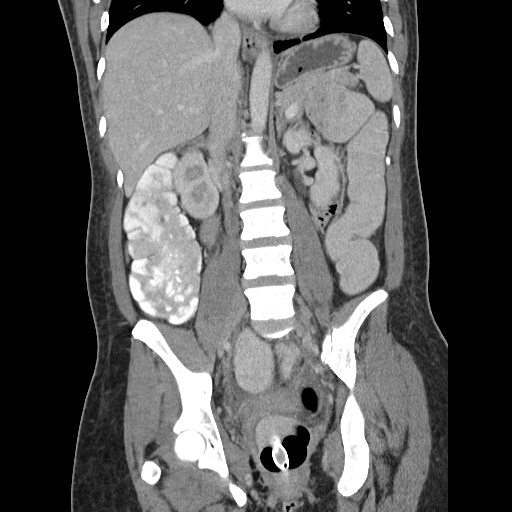

[17 of 46 positions shown; findings below may reference images not displayed]

FINDINGS: Visualized lung bases are unremarkable. No significant osseous
abnormality is noted.

No gallstones are noted. The liver, spleen and pancreas
unremarkable. Nonobstructive right renal calculus is noted. No
hydronephrosis or renal obstruction is noted. Adrenal glands are
unremarkable. Diffuse colonic dilatation is noted concerning for
ileus. The appendix appears normal. No small bowel dilatation is
noted. Interval placement of 2 drainage catheters in the pelvis.
There is a mild amount of residual fluid seen around the fluid
collection in the left adnexal region which is significantly
improved compared to prior exam. Fluid collection seen anteriorly in
the pelvis appears to be almost completely decompressed. Urinary
bladder appears normal. Rectal tube is noted.
IMPRESSION: There is been interval placement of 2 drainage catheters into fluid
collections of the pelvis, both of which appear to be significantly
improved compared to prior exam.

Diffuse colonic dilatation is noted most consistent with ileus.
Rectal tube is noted.

Small nonobstructive right renal calculus.

## 2017-11-12 IMAGING — RF DG ABDOMEN 1V
1 series · 6 of 6 positions shown · non-contrast
Comparison: CTA [DATE]

CLINICAL DATA: Left ureteral obstruction.

EXAM:
ABDOMEN - 1 VIEW; DG C-ARM 1-60 MIN-NO REPORT

[Series 1: run · 6 of 6 slices shown]
[im 1/6]
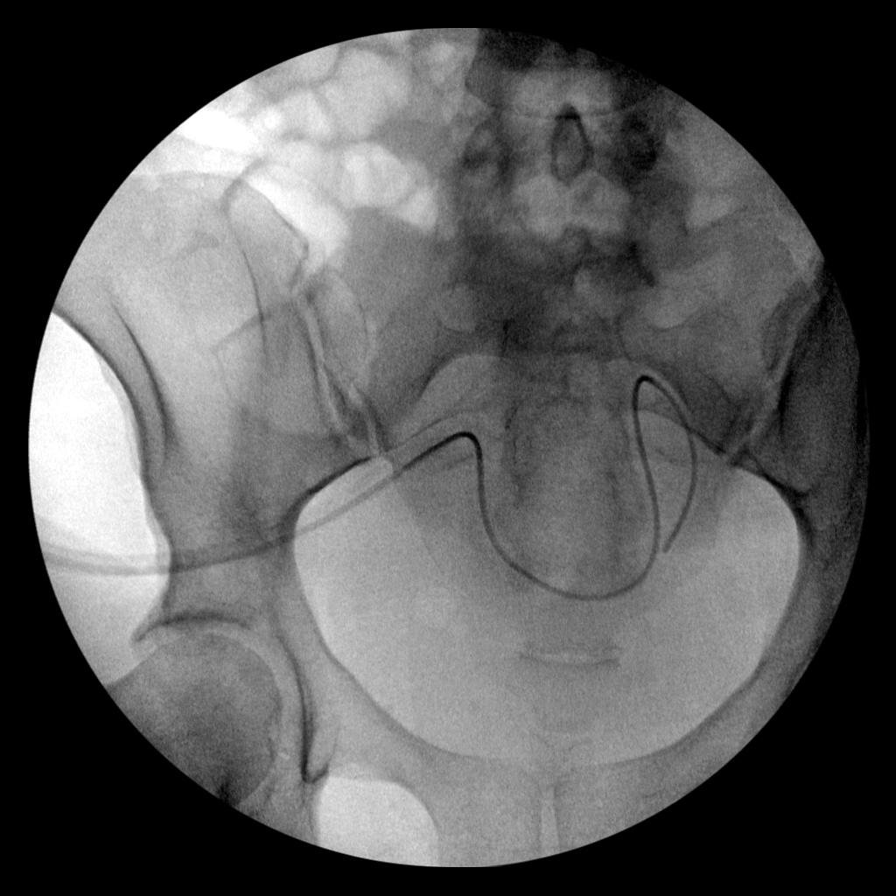
[im 2/6]
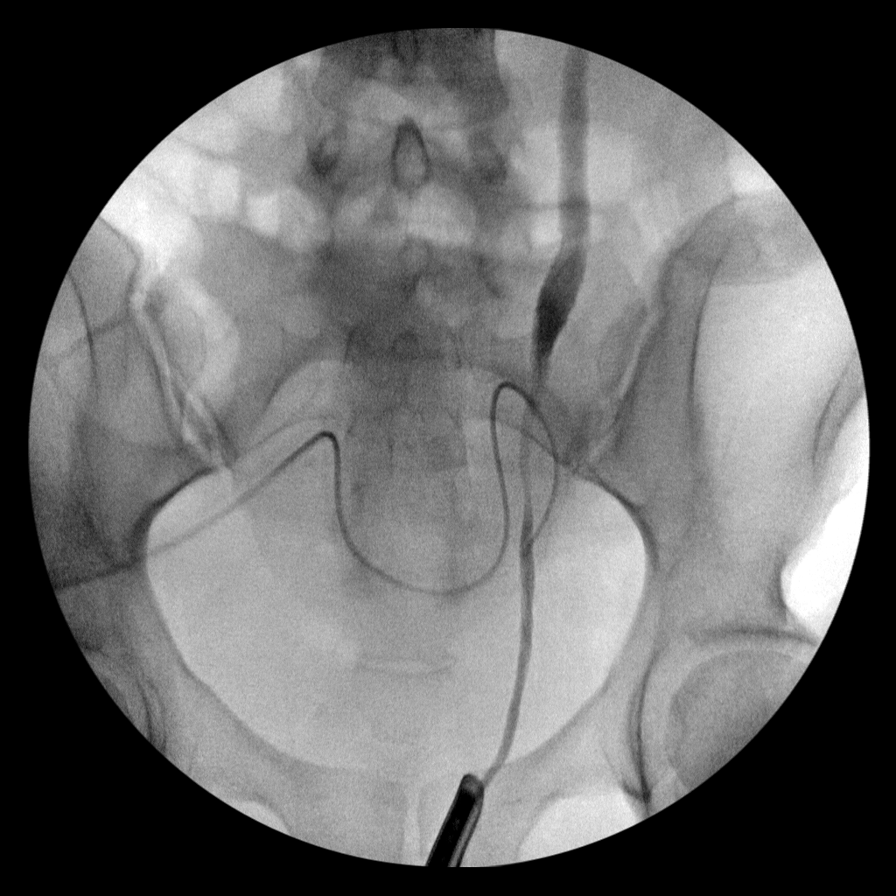
[im 3/6]
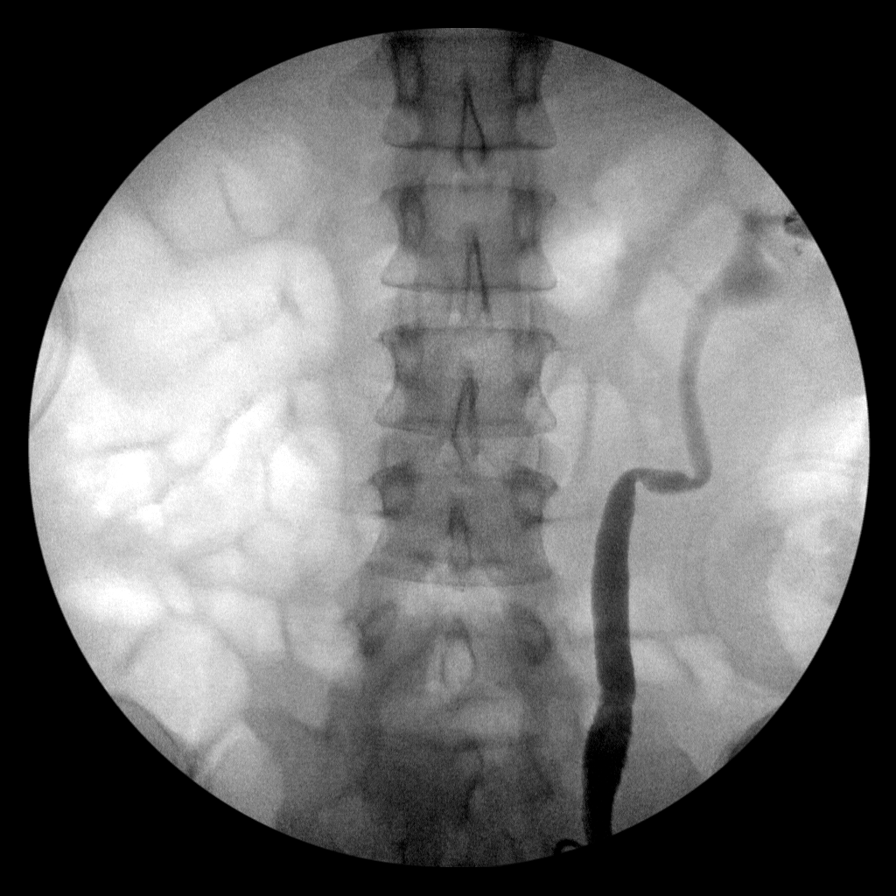
[im 4/6]
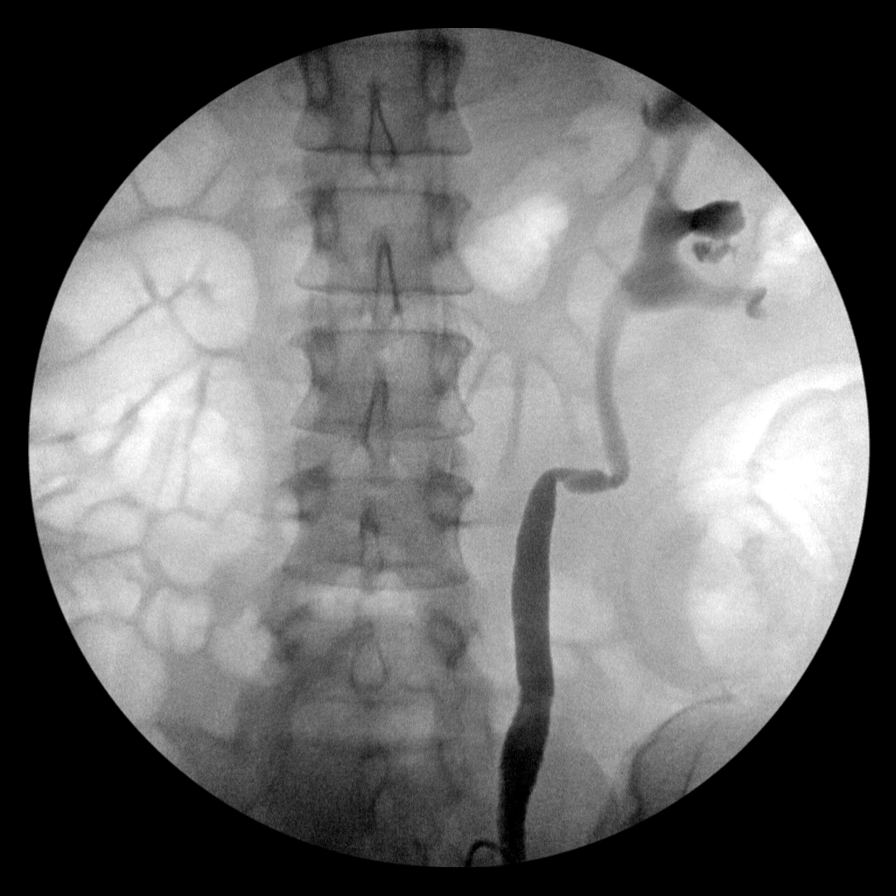
[im 5/6]
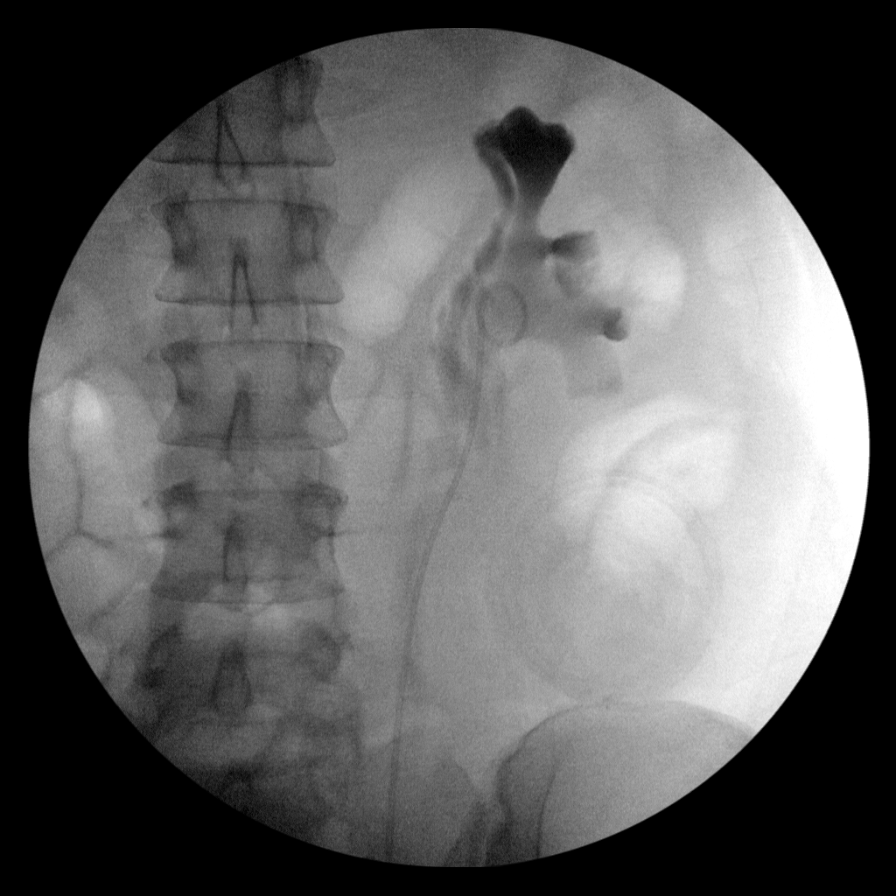
[im 6/6]
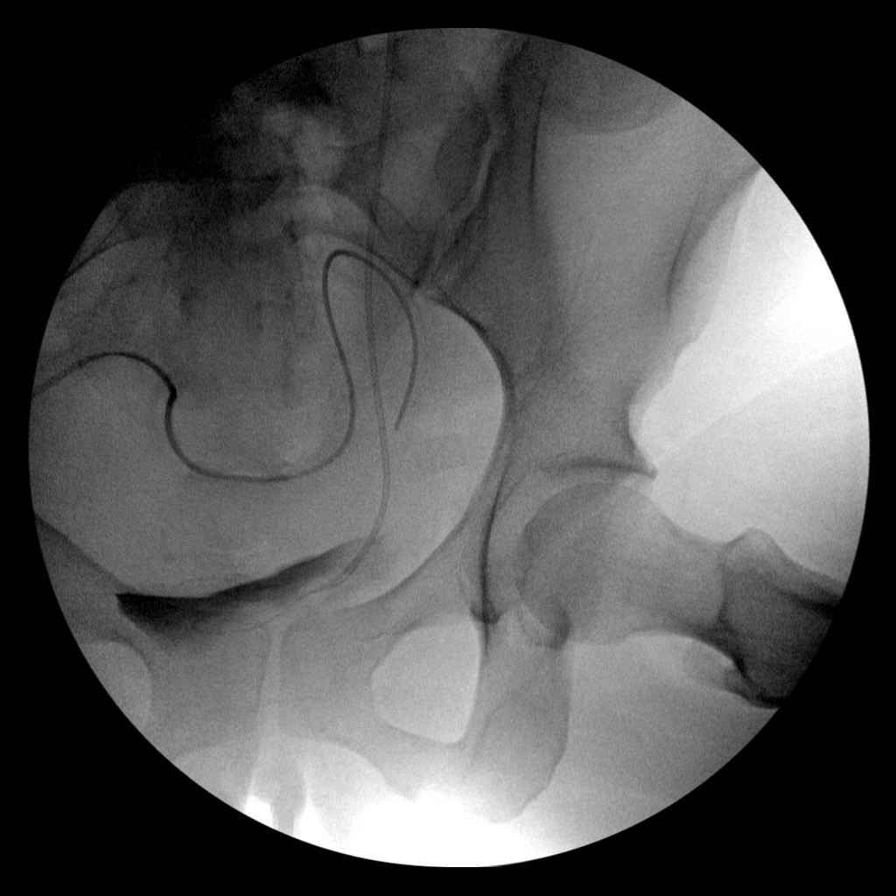

[6 of 6 positions shown; findings below may reference images not displayed]

FINDINGS: Retrograde pyelogram on the left. Left ureter is diffusely dilated
with mild narrowing distally probably due to extrinsic compression.
No focal stricture or mass. Left ureteral stent placed in good
position.
IMPRESSION: Left ureteral dilatation.  Left ureteral stent placed.

## 2021-04-07 ENCOUNTER — Emergency Department (HOSPITAL_BASED_OUTPATIENT_CLINIC_OR_DEPARTMENT_OTHER): Payer: Commercial Managed Care - PPO

## 2021-04-07 ENCOUNTER — Other Ambulatory Visit: Payer: Self-pay

## 2021-04-07 ENCOUNTER — Telehealth (HOSPITAL_BASED_OUTPATIENT_CLINIC_OR_DEPARTMENT_OTHER): Payer: Self-pay | Admitting: Emergency Medicine

## 2021-04-07 ENCOUNTER — Emergency Department (HOSPITAL_BASED_OUTPATIENT_CLINIC_OR_DEPARTMENT_OTHER)
Admission: EM | Admit: 2021-04-07 | Discharge: 2021-04-07 | Disposition: A | Discharge status: Home / Self Care | Payer: Commercial Managed Care - PPO | Attending: Emergency Medicine | Admitting: Emergency Medicine

## 2021-04-07 DIAGNOSIS — S61432A Puncture wound without foreign body of left hand, initial encounter: Secondary | ICD-10-CM | POA: Insufficient documentation

## 2021-04-07 DIAGNOSIS — Z23 Encounter for immunization: Secondary | ICD-10-CM | POA: Insufficient documentation

## 2021-04-07 DIAGNOSIS — S61552A Open bite of left wrist, initial encounter: Secondary | ICD-10-CM

## 2021-04-07 DIAGNOSIS — S6992XA Unspecified injury of left wrist, hand and finger(s), initial encounter: Secondary | ICD-10-CM | POA: Diagnosis present

## 2021-04-07 DIAGNOSIS — Z87891 Personal history of nicotine dependence: Secondary | ICD-10-CM | POA: Diagnosis not present

## 2021-04-07 DIAGNOSIS — S61452A Open bite of left hand, initial encounter: Secondary | ICD-10-CM

## 2021-04-07 DIAGNOSIS — Z91040 Latex allergy status: Secondary | ICD-10-CM | POA: Diagnosis not present

## 2021-04-07 DIAGNOSIS — S61532A Puncture wound without foreign body of left wrist, initial encounter: Secondary | ICD-10-CM | POA: Insufficient documentation

## 2021-04-07 DIAGNOSIS — W57XXXA Bitten or stung by nonvenomous insect and other nonvenomous arthropods, initial encounter: Secondary | ICD-10-CM | POA: Insufficient documentation

## 2021-04-07 MED ORDER — OXYCODONE-ACETAMINOPHEN 5-325 MG PO TABS: 1.0000 | ORAL_TABLET | Freq: Four times a day (QID) | ORAL | 0 refills | Status: AC | PRN

## 2021-04-07 MED ORDER — LEVOFLOXACIN 750 MG PO TABS
750.0000 mg | ORAL_TABLET | Freq: Every day | ORAL | Status: DC
  Filled 2021-04-07: qty 1

## 2021-04-07 MED ORDER — TETANUS-DIPHTH-ACELL PERTUSSIS 5-2.5-18.5 LF-MCG/0.5 IM SUSY
0.5000 mL | PREFILLED_SYRINGE | Freq: Once | INTRAMUSCULAR | Status: AC
  Administered 2021-04-07: 04:00:00 0.5 mL via INTRAMUSCULAR
  Filled 2021-04-07: qty 0.5

## 2021-04-07 MED ORDER — OXYCODONE-ACETAMINOPHEN 5-325 MG PO TABS: 2.0000 | ORAL_TABLET | Freq: Four times a day (QID) | ORAL | 0 refills | Status: AC | PRN

## 2021-04-07 MED ORDER — MOXIFLOXACIN HCL 400 MG PO TABS: 400.0000 mg | ORAL_TABLET | Freq: Every day | ORAL | 0 refills | Status: AC

## 2021-04-07 MED ORDER — NAPROXEN 250 MG PO TABS
500.0000 mg | ORAL_TABLET | Freq: Once | ORAL | Status: AC
  Administered 2021-04-07: 04:00:00 500 mg via ORAL
  Filled 2021-04-07: qty 2

## 2021-04-07 MED ORDER — LEVOFLOXACIN 750 MG PO TABS
750.0000 mg | ORAL_TABLET | Freq: Every day | ORAL | Status: DC
  Administered 2021-04-07: 04:00:00 750 mg via ORAL

## 2021-04-07 MED ORDER — FLUCONAZOLE 150 MG PO TABS: ORAL_TABLET | ORAL | 0 refills | Status: AC

## 2021-04-07 MED ORDER — OXYCODONE-ACETAMINOPHEN 5-325 MG PO TABS
1.0000 | ORAL_TABLET | Freq: Once | ORAL | Status: AC
  Administered 2021-04-07: 04:00:00 1 via ORAL
  Filled 2021-04-07: qty 1

## 2021-04-07 NOTE — ED Triage Notes (Addendum)
Pt to ED from home with c/o LWR pain after pt was bitten by friends pitbull on Saturday 04/06/21. Pt states that dog is up to date on shots. Pt has two small puncture marks on wrist and hand.

## 2021-04-07 NOTE — ED Notes (Signed)
Pt verbalizes understanding of discharge instructions. Opportunity for questioning and answers were provided. Armand removed by staff, pt discharged from ED to home. Educated to pick up Rx.  

## 2021-04-07 NOTE — ED Provider Notes (Addendum)
Pt called requesting pharmacy change to [facility].  Avelox and Percocet ordered for [facility] on [address].   Jacalyn Lefevre, MD 04/07/21 1338    Jacalyn Lefevre, MD 04/07/21 [phone]

## 2021-04-07 NOTE — ED Provider Notes (Addendum)
[facility] Provider Note: Lowella Dell, MD, FACEP  CSN: [id number] MRN: [id number] ARRIVAL: 04/07/21 at 0302 ROOM: DB015/DB015   CHIEF COMPLAINT  Animal Bite   HISTORY OF PRESENT ILLNESS  04/07/21 3:32 AM Sheila Willis is a 35 y.o. female who was bitten by a friend's pitbull yesterday.  The dog is reportedly up-to-date on its immunizations.  She has 2 small puncture marks of her left palm and wrist.  She states associated pain is a 10 out of 10, primarily in the thenar eminence radiating to the thumb and index finger.  It is worse with movement or palpation of the thenar eminence.  She did clean the wounds soon after the injury.   Past Medical History:  Diagnosis Date  . Anemia   . Depression    hx of   . GERD (gastroesophageal reflux disease)    hx of   . Gestational diabetes    hx of 2013-2014 no problems since   . Headache(784.0)   . PCOS (polycystic ovarian syndrome)   . Postpartum care following vaginal delivery (2/1) 11/13/2012  . SVD (spontaneous vaginal delivery) 11/13/2012    Past Surgical History:  Procedure Laterality Date  . APPENDECTOMY  05/15/2016   Procedure: incidental APPENDECTOMY;  Surgeon: Avel Peace, MD;  Location: [facility];  Service: General;;  . COLECTOMY WITH COLOSTOMY CREATION/HARTMANN PROCEDURE N/A 05/15/2016   Procedure: partial  COLECTOMY AND  COLOSTOMY;  Surgeon: Avel Peace, MD;  Location: [facility];  Service: General;  Laterality: N/A;  . COLOSTOMY TAKEDOWN N/A 01/16/2017   Procedure: LAPAROSCOPIC ASSISTED COLOSTOMY CLOSURE;  Surgeon: Avel Peace, MD;  Location: [facility];  Service: General;  Laterality: N/A;  . CYSTOSCOPY W/ URETERAL STENT PLACEMENT Left 05/15/2016   Procedure: CYSTOSCOPY;  Surgeon: Ihor Gully, MD;  Location: [facility];  Service: Urology;  Laterality: Left;  . CYSTOSCOPY W/ URETERAL STENT PLACEMENT Left 05/25/2016   Procedure: CYSTOSCOPY WITH RETROGRADE PYELOGRAM/LEFT URETERAL STENT PLACEMENT;  Surgeon: [name], MD;   Location: [facility];  Service: Urology;  Laterality: Left;  . CYSTOSCOPY W/ URETERAL STENT REMOVAL  01/06/2017  . INCISION AND DRAINAGE ABSCESS N/A 05/15/2016   Procedure: DRAINAGE ABSCESS OF INTRA ABDOMINAL ABSCESS;  Surgeon: Avel Peace, MD;  Location: [facility];  Service: General;  Laterality: N/A;  . LAPAROTOMY N/A 05/15/2016   Procedure: EXPLORATORY LAPAROTOMY;  Surgeon: Avel Peace, MD;  Location: [facility];  Service: General;  Laterality: N/A;  . NO PAST SURGERIES      Family History  Problem Relation Age of Onset  . Cancer Other   . Hypertension Other   . Stroke Other   . Heart attack Other   . Heart disease Mother   . Hypertension Mother   . Cancer Mother        ovarian    Social History   Tobacco Use  . Smoking status: Former    Packs/day: 0.50    Pack years: 0.00    Types: Cigarettes    Quit date: 04/11/2016    Years since quitting: 4.9  . Smokeless tobacco: Never  Substance Use Topics  . Alcohol use: No  . Drug use: No    Prior to Admission medications   Medication Sig Start Date End Date Taking? Authorizing Provider  fluconazole (DIFLUCAN) 150 MG tablet Take 1 tablet as needed for vaginal yeast infection.  May repeat in 3 days if symptoms persist. 04/07/21  Yes Shontez Sermon, MD  moxifloxacin (AVELOX) 400 MG tablet Take 1 tablet (400 mg  total) by mouth daily at 8 pm. 04/07/21  Yes Cashtyn Pouliot, Jonny Ruiz, MD  oxyCODONE-acetaminophen (PERCOCET) 5-325 MG tablet Take 2 tablets by mouth every 6 (six) hours as needed for severe pain. 04/07/21  Yes Xiara Knisley, MD    Allergies Cefotetan, Contrast media [iodinated diagnostic agents], Latex, and Doxycycline   REVIEW OF SYSTEMS  Negative except as noted here or in the History of Present Illness.   PHYSICAL EXAMINATION  Initial Vital Signs Blood pressure 115/80, pulse 75, temperature 98.8 F (37.1 C), temperature source Oral, resp. rate 18, height 5\' 2"  (1.575 m), weight 68 kg, SpO2 98 %.  Examination General: Well-developed,  well-nourished female in no acute distress; appearance consistent with age of record HENT: normocephalic; atraumatic Eyes: Normal appearance Neck: supple Heart: regular rate and rhythm Lungs: clear to auscultation bilaterally Abdomen: soft; nondistended; nontender; bowel sounds present Extremities: No deformity; puncture wounds of left wrist and left hand with tenderness of the thenar eminence, pain on movement of the thumb or index finger with decreased range of motion due to pain (tendon function appears intact), fingers neurovascularly intact:      Neurologic: Awake, alert and oriented; motor function intact in all extremities and symmetric; no facial droop Skin: Warm and dry Psychiatric: Normal mood and affect   RESULTS  Summary of this visit's results, reviewed and interpreted by myself:   EKG Interpretation  Date/Time:    Ventricular Rate:    PR Interval:    QRS Duration:   QT Interval:    QTC Calculation:   R Axis:     Text Interpretation:          Laboratory Studies: No results found for this or any previous visit (from the past 24 hour(s)). Imaging Studies: DG Hand Complete Left  Result Date: 04/07/2021 CLINICAL DATA:  Initial evaluation for acute dog bite, evaluate for foreign body. EXAM: LEFT HAND - COMPLETE 3+ VIEW COMPARISON:  None. FINDINGS: There is no evidence of fracture or dislocation. There is no evidence of arthropathy or other focal bone abnormality. Mild soft tissue irregularity seen about the base of the thumb, presumably related to dog bite. No radiopaque foreign body seen about the hand or wrist. IMPRESSION: 1. Mild soft tissue irregularity about the base of the thumb, presumably related to dog bite. No radiopaque foreign body. 2. No acute osseous abnormality about the left hand. Electronically Signed   By: 04/09/2021 M.D.   On: 04/07/2021 04:52    ED COURSE and MDM  Nursing notes, initial and subsequent vitals signs, including pulse  oximetry, reviewed and interpreted by myself.  Vitals:   04/07/21 0312 04/07/21 0315  BP: 115/80   Pulse: 75   Resp: 18   Temp: 98.8 F (37.1 C)   TempSrc: Oral   SpO2: 98%   Weight:  68 kg  Height:  5\' 2"  (1.575 m)   Medications  levofloxacin (LEVAQUIN) tablet 750 mg (750 mg Oral Given 04/07/21 0358)  Tdap (BOOSTRIX) injection 0.5 mL (0.5 mLs Intramuscular Given 04/07/21 0357)  oxyCODONE-acetaminophen (PERCOCET/ROXICET) 5-325 MG per tablet 1 tablet (1 tablet Oral Given 04/07/21 0358)  naproxen (NAPROSYN) tablet 500 mg (500 mg Oral Given 04/07/21 0358)    As patient is allergic to cefotetan this raises a question of whether she could tolerate any beta-lactam.  She is also allergic to doxycycline.  The literature supports the use of moxifloxacin for dog bites, although flora quinolones are not usually first-line due to side effects.  I believe the benefits outweigh  the risks given the potentially infected puncture wounds.   PROCEDURES  Procedures   ED DIAGNOSES     ICD-10-CM   1. Dog bite, hand, left, initial encounter  S61.452A    W54.0XXA     2. Dog bite of left wrist, initial encounter  Y37.096K    W54.Olin Hauser, MD 04/07/21 0455    Paula Libra, MD 04/07/21 [phone]

## 2022-03-09 ENCOUNTER — Emergency Department (HOSPITAL_BASED_OUTPATIENT_CLINIC_OR_DEPARTMENT_OTHER): Payer: Commercial Managed Care - PPO

## 2022-03-09 ENCOUNTER — Other Ambulatory Visit: Payer: Self-pay

## 2022-03-09 ENCOUNTER — Encounter (HOSPITAL_BASED_OUTPATIENT_CLINIC_OR_DEPARTMENT_OTHER): Payer: Self-pay | Admitting: Emergency Medicine

## 2022-03-09 ENCOUNTER — Emergency Department (HOSPITAL_BASED_OUTPATIENT_CLINIC_OR_DEPARTMENT_OTHER)
Admission: EM | Admit: 2022-03-09 | Discharge: 2022-03-09 | Disposition: A | Discharge status: Home / Self Care | Payer: Commercial Managed Care - PPO | Attending: Emergency Medicine | Admitting: Emergency Medicine

## 2022-03-09 DIAGNOSIS — M25552 Pain in left hip: Secondary | ICD-10-CM | POA: Insufficient documentation

## 2022-03-09 DIAGNOSIS — Z91040 Latex allergy status: Secondary | ICD-10-CM | POA: Diagnosis not present

## 2022-03-09 DIAGNOSIS — R109 Unspecified abdominal pain: Secondary | ICD-10-CM | POA: Insufficient documentation

## 2022-03-09 LAB — URINALYSIS, ROUTINE W REFLEX MICROSCOPIC
Bilirubin Urine: NEGATIVE
Glucose, UA: NEGATIVE mg/dL
Hgb urine dipstick: NEGATIVE
Ketones, ur: NEGATIVE mg/dL
Leukocytes,Ua: NEGATIVE
Nitrite: NEGATIVE
Protein, ur: NEGATIVE mg/dL
Specific Gravity, Urine: >=1.03 (ref 1.005–1.030)
pH: 5.5 (ref 5.0–8.0)

## 2022-03-09 LAB — PREGNANCY, URINE: Preg Test, Ur: NEGATIVE
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
